# Patient Record
Sex: Male | Born: 1935 | Race: Black or African American | Hispanic: No | Marital: Married | Smoking: Never smoker
Health system: Southern US, Community
[De-identification: ages and names within clinical notes are randomized; demographics above are authoritative.]

## PROBLEM LIST (undated history)

## (undated) DIAGNOSIS — I10 Essential (primary) hypertension: Secondary | ICD-10-CM

## (undated) DIAGNOSIS — I639 Cerebral infarction, unspecified: Secondary | ICD-10-CM

## (undated) DIAGNOSIS — I2510 Atherosclerotic heart disease of native coronary artery without angina pectoris: Secondary | ICD-10-CM

## (undated) DIAGNOSIS — E119 Type 2 diabetes mellitus without complications: Secondary | ICD-10-CM

## (undated) DIAGNOSIS — E7800 Pure hypercholesterolemia, unspecified: Secondary | ICD-10-CM

## (undated) DIAGNOSIS — Z950 Presence of cardiac pacemaker: Secondary | ICD-10-CM

## (undated) DIAGNOSIS — R0602 Shortness of breath: Secondary | ICD-10-CM

## (undated) DIAGNOSIS — IMO0001 Reserved for inherently not codable concepts without codable children: Secondary | ICD-10-CM

## (undated) DIAGNOSIS — I442 Atrioventricular block, complete: Secondary | ICD-10-CM

## (undated) DIAGNOSIS — I739 Peripheral vascular disease, unspecified: Secondary | ICD-10-CM

## (undated) DIAGNOSIS — H409 Unspecified glaucoma: Secondary | ICD-10-CM

## (undated) DIAGNOSIS — I509 Heart failure, unspecified: Secondary | ICD-10-CM

## (undated) DIAGNOSIS — I96 Gangrene, not elsewhere classified: Secondary | ICD-10-CM

## (undated) DIAGNOSIS — R634 Abnormal weight loss: Secondary | ICD-10-CM

## (undated) DIAGNOSIS — I219 Acute myocardial infarction, unspecified: Secondary | ICD-10-CM

## (undated) HISTORY — DX: Heart failure, unspecified: I50.9

## (undated) HISTORY — PX: FOOT SURGERY: SHX648

## (undated) HISTORY — DX: Peripheral vascular disease, unspecified: I73.9

## (undated) HISTORY — DX: Gangrene, not elsewhere classified: I96

## (undated) HISTORY — PX: PR VEIN BYPASS GRAFT,AORTO-FEM-POP: 35551

## (undated) HISTORY — DX: Essential (primary) hypertension: I10

## (undated) HISTORY — DX: Cerebral infarction, unspecified: I63.9

## (undated) HISTORY — DX: Atherosclerotic heart disease of native coronary artery without angina pectoris: I25.10

## (undated) HISTORY — DX: Abnormal weight loss: R63.4

## (undated) HISTORY — PX: PACEMAKER INSERTION: SHX728

## (undated) HISTORY — DX: Acute myocardial infarction, unspecified: I21.9

## (undated) HISTORY — PX: CARDIAC CATHETERIZATION: SHX172

## (undated) HISTORY — PX: INSERT / REPLACE / REMOVE PACEMAKER: SUR710

## (undated) HISTORY — DX: Atrioventricular block, complete: I44.2

## (undated) HISTORY — PX: APPENDECTOMY: SHX54

## (undated) HISTORY — DX: Reserved for inherently not codable concepts without codable children: IMO0001

## (undated) HISTORY — DX: Type 2 diabetes mellitus without complications: E11.9

## (undated) HISTORY — DX: Pure hypercholesterolemia, unspecified: E78.00

## (undated) HISTORY — PX: AMPUTATION: SHX166

---
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
## 1999-05-10 ENCOUNTER — Encounter (INDEPENDENT_AMBULATORY_CARE_PROVIDER_SITE_OTHER): Payer: Self-pay

## 1999-05-10 ENCOUNTER — Other Ambulatory Visit: Admission: RE | Admit: 1999-05-10 | Discharge: 1999-05-10 | Payer: Self-pay | Admitting: Gastroenterology

## 2000-01-31 ENCOUNTER — Encounter: Payer: Self-pay | Admitting: Emergency Medicine

## 2000-01-31 ENCOUNTER — Encounter (INDEPENDENT_AMBULATORY_CARE_PROVIDER_SITE_OTHER): Payer: Self-pay | Admitting: Specialist

## 2000-01-31 ENCOUNTER — Encounter (HOSPITAL_BASED_OUTPATIENT_CLINIC_OR_DEPARTMENT_OTHER): Payer: Self-pay | Admitting: General Surgery

## 2000-01-31 ENCOUNTER — Inpatient Hospital Stay (HOSPITAL_COMMUNITY): Admission: EM | Admit: 2000-01-31 | Discharge: 2000-02-11 | Payer: Self-pay | Admitting: Emergency Medicine

## 2000-02-01 ENCOUNTER — Encounter (HOSPITAL_BASED_OUTPATIENT_CLINIC_OR_DEPARTMENT_OTHER): Payer: Self-pay | Admitting: General Surgery

## 2000-02-03 ENCOUNTER — Encounter (HOSPITAL_BASED_OUTPATIENT_CLINIC_OR_DEPARTMENT_OTHER): Payer: Self-pay | Admitting: General Surgery

## 2000-02-05 ENCOUNTER — Encounter (HOSPITAL_BASED_OUTPATIENT_CLINIC_OR_DEPARTMENT_OTHER): Payer: Self-pay | Admitting: General Surgery

## 2000-02-08 ENCOUNTER — Encounter: Payer: Self-pay | Admitting: Cardiology

## 2000-02-09 ENCOUNTER — Encounter (HOSPITAL_BASED_OUTPATIENT_CLINIC_OR_DEPARTMENT_OTHER): Payer: Self-pay | Admitting: General Surgery

## 2000-02-20 ENCOUNTER — Encounter: Admission: RE | Admit: 2000-02-20 | Discharge: 2000-05-20 | Payer: Self-pay | Admitting: General Surgery

## 2000-03-02 ENCOUNTER — Ambulatory Visit (HOSPITAL_COMMUNITY): Admission: RE | Admit: 2000-03-02 | Discharge: 2000-03-02 | Payer: Self-pay | Admitting: Cardiology

## 2000-03-02 ENCOUNTER — Encounter: Payer: Self-pay | Admitting: Cardiology

## 2000-03-13 ENCOUNTER — Ambulatory Visit (HOSPITAL_COMMUNITY): Admission: RE | Admit: 2000-03-13 | Discharge: 2000-03-14 | Payer: Self-pay | Admitting: Cardiology

## 2000-09-12 ENCOUNTER — Encounter: Payer: Self-pay | Admitting: Cardiology

## 2000-09-12 ENCOUNTER — Ambulatory Visit (HOSPITAL_COMMUNITY): Admission: RE | Admit: 2000-09-12 | Discharge: 2000-09-12 | Payer: Self-pay | Admitting: Cardiology

## 2001-05-15 ENCOUNTER — Encounter: Admission: RE | Admit: 2001-05-15 | Discharge: 2001-05-15 | Payer: Self-pay | Admitting: General Surgery

## 2001-05-15 ENCOUNTER — Encounter: Payer: Self-pay | Admitting: General Surgery

## 2001-05-22 ENCOUNTER — Encounter (HOSPITAL_BASED_OUTPATIENT_CLINIC_OR_DEPARTMENT_OTHER): Admission: RE | Admit: 2001-05-22 | Discharge: 2001-08-20 | Payer: Self-pay | Admitting: Internal Medicine

## 2001-05-28 ENCOUNTER — Encounter (HOSPITAL_BASED_OUTPATIENT_CLINIC_OR_DEPARTMENT_OTHER): Payer: Self-pay | Admitting: Internal Medicine

## 2001-05-28 ENCOUNTER — Ambulatory Visit (HOSPITAL_COMMUNITY): Admission: RE | Admit: 2001-05-28 | Discharge: 2001-05-28 | Payer: Self-pay | Admitting: Internal Medicine

## 2001-06-05 ENCOUNTER — Encounter (INDEPENDENT_AMBULATORY_CARE_PROVIDER_SITE_OTHER): Payer: Self-pay | Admitting: Specialist

## 2001-06-05 ENCOUNTER — Encounter (INDEPENDENT_AMBULATORY_CARE_PROVIDER_SITE_OTHER): Payer: Self-pay | Admitting: *Deleted

## 2001-06-05 ENCOUNTER — Encounter: Payer: Self-pay | Admitting: Vascular Surgery

## 2001-06-05 ENCOUNTER — Inpatient Hospital Stay (HOSPITAL_COMMUNITY): Admission: AD | Admit: 2001-06-05 | Discharge: 2001-06-13 | Payer: Self-pay | Admitting: Vascular Surgery

## 2001-06-10 HISTORY — PX: BELOW KNEE LEG AMPUTATION: SUR23

## 2001-06-13 ENCOUNTER — Inpatient Hospital Stay (HOSPITAL_COMMUNITY)
Admission: AD | Admit: 2001-06-13 | Discharge: 2001-06-21 | Payer: Self-pay | Admitting: Physical Medicine & Rehabilitation

## 2001-06-20 ENCOUNTER — Encounter: Payer: Self-pay | Admitting: Physical Medicine & Rehabilitation

## 2001-07-01 ENCOUNTER — Encounter
Admission: RE | Admit: 2001-07-01 | Discharge: 2001-09-29 | Payer: Self-pay | Admitting: Physical Medicine & Rehabilitation

## 2001-09-30 ENCOUNTER — Encounter
Admission: RE | Admit: 2001-09-30 | Discharge: 2001-11-07 | Payer: Self-pay | Admitting: Physical Medicine & Rehabilitation

## 2002-02-12 ENCOUNTER — Encounter: Payer: Self-pay | Admitting: Cardiology

## 2002-02-12 ENCOUNTER — Ambulatory Visit (HOSPITAL_COMMUNITY): Admission: RE | Admit: 2002-02-12 | Discharge: 2002-02-12 | Payer: Self-pay | Admitting: Cardiology

## 2003-04-22 ENCOUNTER — Ambulatory Visit (HOSPITAL_COMMUNITY): Admission: RE | Admit: 2003-04-22 | Discharge: 2003-04-22 | Payer: Self-pay | Admitting: Cardiology

## 2006-03-08 ENCOUNTER — Emergency Department (HOSPITAL_COMMUNITY): Admission: EM | Admit: 2006-03-08 | Discharge: 2006-03-08 | Payer: Self-pay | Admitting: Family Medicine

## 2007-01-15 ENCOUNTER — Inpatient Hospital Stay (HOSPITAL_COMMUNITY): Admission: EM | Admit: 2007-01-15 | Discharge: 2007-01-18 | Payer: Self-pay | Admitting: Emergency Medicine

## 2007-01-16 ENCOUNTER — Encounter (INDEPENDENT_AMBULATORY_CARE_PROVIDER_SITE_OTHER): Payer: Self-pay | Admitting: Cardiology

## 2007-01-16 ENCOUNTER — Ambulatory Visit: Payer: Self-pay | Admitting: Vascular Surgery

## 2007-01-21 ENCOUNTER — Ambulatory Visit (HOSPITAL_COMMUNITY): Admission: RE | Admit: 2007-01-21 | Discharge: 2007-01-21 | Payer: Self-pay | Admitting: Vascular Surgery

## 2007-01-31 ENCOUNTER — Inpatient Hospital Stay (HOSPITAL_COMMUNITY): Admission: RE | Admit: 2007-01-31 | Discharge: 2007-02-05 | Payer: Self-pay | Admitting: Vascular Surgery

## 2007-01-31 ENCOUNTER — Encounter: Payer: Self-pay | Admitting: Vascular Surgery

## 2007-02-01 ENCOUNTER — Encounter: Payer: Self-pay | Admitting: Vascular Surgery

## 2007-02-04 ENCOUNTER — Ambulatory Visit: Payer: Self-pay | Admitting: Surgery

## 2007-02-15 ENCOUNTER — Ambulatory Visit: Payer: Self-pay | Admitting: Vascular Surgery

## 2007-02-19 ENCOUNTER — Ambulatory Visit: Payer: Self-pay | Admitting: Vascular Surgery

## 2007-03-05 ENCOUNTER — Ambulatory Visit: Payer: Self-pay | Admitting: Vascular Surgery

## 2007-05-07 ENCOUNTER — Ambulatory Visit: Payer: Self-pay | Admitting: Vascular Surgery

## 2007-08-06 ENCOUNTER — Ambulatory Visit: Payer: Self-pay | Admitting: Vascular Surgery

## 2007-08-20 ENCOUNTER — Inpatient Hospital Stay (HOSPITAL_COMMUNITY): Admission: EM | Admit: 2007-08-20 | Discharge: 2007-08-25 | Payer: Self-pay | Admitting: Emergency Medicine

## 2007-08-22 ENCOUNTER — Encounter (INDEPENDENT_AMBULATORY_CARE_PROVIDER_SITE_OTHER): Payer: Self-pay | Admitting: Cardiology

## 2007-08-29 ENCOUNTER — Encounter: Payer: Self-pay | Admitting: Interventional Radiology

## 2007-10-16 ENCOUNTER — Inpatient Hospital Stay (HOSPITAL_COMMUNITY): Admission: RE | Admit: 2007-10-16 | Discharge: 2007-10-17 | Payer: Self-pay | Admitting: Interventional Radiology

## 2007-10-30 ENCOUNTER — Encounter: Payer: Self-pay | Admitting: Interventional Radiology

## 2007-11-05 ENCOUNTER — Ambulatory Visit: Payer: Self-pay | Admitting: Vascular Surgery

## 2008-01-21 ENCOUNTER — Ambulatory Visit: Payer: Self-pay | Admitting: Vascular Surgery

## 2008-07-21 ENCOUNTER — Ambulatory Visit: Payer: Self-pay | Admitting: Vascular Surgery

## 2009-02-11 ENCOUNTER — Ambulatory Visit: Payer: Self-pay | Admitting: Vascular Surgery

## 2009-09-17 ENCOUNTER — Ambulatory Visit: Payer: Self-pay | Admitting: Internal Medicine

## 2009-09-17 ENCOUNTER — Inpatient Hospital Stay (HOSPITAL_COMMUNITY): Admission: EM | Admit: 2009-09-17 | Discharge: 2009-09-23 | Payer: Self-pay | Admitting: Emergency Medicine

## 2009-09-24 ENCOUNTER — Encounter: Payer: Self-pay | Admitting: Internal Medicine

## 2009-09-30 ENCOUNTER — Encounter: Payer: Self-pay | Admitting: Internal Medicine

## 2009-09-30 ENCOUNTER — Ambulatory Visit: Payer: Self-pay

## 2009-10-13 ENCOUNTER — Ambulatory Visit: Payer: Self-pay | Admitting: Vascular Surgery

## 2009-12-22 ENCOUNTER — Ambulatory Visit: Payer: Self-pay | Admitting: Internal Medicine

## 2009-12-22 DIAGNOSIS — E7800 Pure hypercholesterolemia, unspecified: Secondary | ICD-10-CM

## 2009-12-22 DIAGNOSIS — I442 Atrioventricular block, complete: Secondary | ICD-10-CM

## 2009-12-22 DIAGNOSIS — E119 Type 2 diabetes mellitus without complications: Secondary | ICD-10-CM | POA: Insufficient documentation

## 2009-12-22 DIAGNOSIS — I10 Essential (primary) hypertension: Secondary | ICD-10-CM | POA: Insufficient documentation

## 2010-02-15 NOTE — Cardiovascular Report (Signed)
Summary: Office Visit   Office Visit   Imported By: Roderic Ovens 10/13/2009 15:44:24  _____________________________________________________________________  External Attachment:    Type:   Image     Comment:   External Document

## 2010-02-15 NOTE — Assessment & Plan Note (Signed)
Summary: pacer check.mdt.amber   Visit Type:  Pacemaker check Primary Provider:  Rinaldo Cloud  CC:   .  History of Present Illness: The patient presents today for routine electrophysiology followup. He reports doing very well since his pacemaker was implanted. The patient denies symptoms of palpitations, chest pain, shortness of breath, fevers, chills, orthopnea, PND, lower extremity edema, presyncope, syncope, or neurologic sequela.  He has occasional dizziness.  The patient is tolerating medications without difficulties and is otherwise without complaint today.   Current Medications (verified): 1)  Aspirin Ec 325 Mg Tbec (Aspirin) .... Take One Tablet By Mouth Daily 2)  Carvedilol 25 Mg Tabs (Carvedilol) .Marland Kitchen.. 1 Tab Two Times A Day 3)  Benazepril Hcl 20 Mg Tabs (Benazepril Hcl) .Marland Kitchen.. 1 Tab Once Daily 4)  Imdur 60 Mg Xr24h-Tab (Isosorbide Mononitrate) .Marland Kitchen.. 1 Tab Once Daily 5)  Amaryl 4 Mg Tabs (Glimepiride) .Marland Kitchen.. 1 Tab Two Times A Day 6)  Simvastatin 40 Mg Tabs (Simvastatin) .Marland Kitchen.. 1 Tab Once Daily 7)  Protonix 40 Mg Tbec (Pantoprazole Sodium) .Marland Kitchen.. 1 Tab Once Daily  Allergies (verified): No Known Drug Allergies  Past History:  Past Medical History: Complete heart block s/p PPM by JA (MDT) 9/11 HYPERCHOLESTEROLEMIA (ICD-272.0) HYPERTENSION (ICD-401.9) CAD s/p PTCA to PLV branch in 2002 Non-insulin-dependent diabetes mellitus peripheral vascular disease status post right BKA   history of CVA and had PTA to middle cerebral artery in the past  Social History: Reviewed history from 12/22/2009 and no changes required. Married  Tobacco Use - No.  Alcohol Use - no  Review of Systems       All systems are reviewed and negative except as listed in the HPI.   Vital Signs:  Patient profile:   77 year old male Height:      68.5 inches Weight:      198.50 pounds BMI:     29.85 Pulse rate:   77 / minute Pulse rhythm:   regular BP sitting:   168 / 90  (right arm) Cuff size:    large  Vitals Entered By: Danielle Rankin, CMA (December 22, 2009 9:42 AM)  Physical Exam  General:  elderly male, NAD Head:  normocephalic and atraumatic Eyes:  PERRLA/EOM intact; conjunctiva and lids normal. Mouth:  Teeth, gums and palate normal. Oral mucosa normal. Neck:  supple Chest Wall:  pacemaker pocket is well healed Lungs:  Clear bilaterally to auscultation and percussion. Heart:  RRR, no m/r/g Abdomen:  Bowel sounds positive; abdomen soft and non-tender without masses, organomegaly, or hernias noted. No hepatosplenomegaly. Msk:  s/p R BKA Extremities:  s/p R BKA Neurologic:  Alert and oriented x 3. Skin:  Intact without lesions or rashes. Psych:  Normal affect.   PPM Specifications Following MD:  Hillis Range, MD     PPM Vendor:  Medtronic     PPM Model Number:  ADDRL1     PPM Serial Number:  [id number] PPM DOI:  09/21/2009     PPM Implanting MD:  Hillis Range, MD  Lead 1    Location: RA     DOI: 09/21/2009     Model #: [id number]     Serial #: [id number]     Status: active Lead 2    Location: RV     DOI: 09/21/2009     Model #: [id number]     Serial #: [id number]     Status: active  Magnet Response Rate:  BOL 85 ERI 65  Indications:  CHB   PPM  Follow Up Remote Check?  No Battery Voltage:  2.79 V     Battery Est. Longevity:  11.5 years     Pacer Dependent:  Yes       PPM Device Measurements Atrium  Amplitude: 2.8 mV, Impedance: 500 ohms, Threshold: 0.5 V at 0.4 msec Right Ventricle  Impedance: 567 ohms, Threshold: 0.5 V at 0.4 msec  Episodes MS Episodes:  22     Percent Mode Switch:  ,0.1%     Coumadin:  No Ventricular High Rate:  0     Atrial Pacing:  9.9%     Ventricular Pacing:  99.9%  Parameters Mode:  DDD     Lower Rate Limit:  60     Upper Rate Limit:  130 Paced AV Delay:  18     Sensed AV Delay:  0150 Next Cardiology Appt Due:  09/17/2010 Tech Comments:  No parameter changes.  Device function normal.  ROV 9/12 with Dr. Johney Frame. Altha Harm, LPN  December 22, 2009  9:49 AM  MD Comments:  agree  Impression & Recommendations:  Problem # 1:  PACEMAKER, PERMANENT (ICD-V45.01) s/p PPM for complete heart block.  He is no underlying ventricular escape today Normal pacemaker function no changes today  Problem # 2:  HYPERTENSION (ICD-401.9) above goal he will follow-up with Dr Sharyn Lull  Problem # 3:  HYPERCHOLESTEROLEMIA (ICD-272.0) stable  Patient Instructions: 1)  Your physician recommends that you continue on your current medications as directed. Please refer to the Current Medication list given to you today. 2)  Your physician wants you to follow-up in: September 2012   You will receive a reminder letter in the mail two months in advance. If you don't receive a letter, please call our office to schedule the follow-up appointment.

## 2010-02-15 NOTE — Procedures (Signed)
Summary: pacer check.mdt.amber   Allergies (verified): No Known Drug Allergies  PPM Specifications Following MD:  Hillis Range, MD     PPM Vendor:  Medtronic     PPM Model Number:  ADDRL1     PPM Serial Number:  [id number] PPM DOI:  09/21/2009     PPM Implanting MD:  Hillis Range, MD  Lead 1    Location: RA     DOI: 09/21/2009     Model #: 4098     Serial #: [id number]     Status: active Lead 2    Location: RV     DOI: 09/21/2009     Model #: 6213     Serial #: [id number]     Status: active  Magnet Response Rate:  BOL 85 ERI 65  Indications:  CHB   PPM Follow Up Remote Check?  No Battery Voltage:  2.79 V     Battery Est. Longevity:  10 years     Pacer Dependent:  Yes       PPM Device Measurements Atrium  Amplitude: 2.8 mV, Impedance: 494 ohms, Threshold: 0.5 V at 0.4 msec Right Ventricle  Impedance: 645 ohms, Threshold: 0.625 V at 0.4 msec  Episodes MS Episodes:  1     Percent Mode Switch:  <0.1%     Coumadin:  No Ventricular High Rate:  0     Atrial Pacing:  9.1%     Ventricular Pacing:  99.9%  Parameters Mode:  DDD     Lower Rate Limit:  60     Upper Rate Limit:  130 Paced AV Delay:  18     Sensed AV Delay:  0150 Next Cardiology Appt Due:  12/22/2009 Tech Comments:  Steri strips removed, no redness or edema noted.  No parameter changes.  Device function normal.  ROV 12/7 with Dr. Johney Frame.  Mr. Lorey did not have a med list with him today. Altha Harm, LPN  September 30, 2009 10:33 AM

## 2010-02-15 NOTE — Miscellaneous (Signed)
Summary: Device preload  Clinical Lists Changes  Observations: Added new observation of PPM INDICATN: CHB (09/24/2009 7:50) Added new observation of MAGNET RTE: BOL 85 ERI 65 (09/24/2009 7:50) Added new observation of PPMLEADSTAT2: active (09/24/2009 7:50) Added new observation of PPMLEADSER2: [id number] (09/24/2009 7:50) Added new observation of PPMLEADMOD2: 5076  (09/24/2009 7:50) Added new observation of PPMLEADLOC2: RV  (09/24/2009 7:50) Added new observation of PPMLEADSTAT1: active  (09/24/2009 7:50) Added new observation of PPMLEADSER1: [id number]  (09/24/2009 7:50) Added new observation of PPMLEADMOD1: 5076  (09/24/2009 7:50) Added new observation of PPMLEADLOC1: RA  (09/24/2009 7:50) Added new observation of PPM IMP MD: Hillis Range, MD  (09/24/2009 7:50) Added new observation of PPMLEADDOI2: 09/21/2009  (09/24/2009 7:50) Added new observation of PPMLEADDOI1: 09/21/2009  (09/24/2009 7:50) Added new observation of PPM DOI: 09/21/2009  (09/24/2009 7:50) Added new observation of PPM SERL#: [id number]  (09/24/2009 7:50) Added new observation of PPM MODL#: ADDRL1  (09/24/2009 6:57) Added new observation of PACEMAKERMFG: Medtronic  (09/24/2009 7:50) Added new observation of PACEMAKER MD: Hillis Range, MD  (09/24/2009 7:50)      PPM Specifications Following MD:  Hillis Range, MD     PPM Vendor:  [employer]onic     PPM Model Number:  ADDRL1     PPM Serial Number:  [id number] H PPM DOI:  09/21/2009     PPM Implanting MD:  Hillis Range, MD  Lead 1    Location: RA     DOI: 09/21/2009     Model #: 8413     Serial #: [id number]     Status: active Lead 2    Location: RV     DOI: 09/21/2009     Model #: 3664     Serial #: [id number]     Status: active  Magnet Response Rate:  BOL 85 ERI 65  Indications:  CHB

## 2010-02-17 NOTE — Cardiovascular Report (Signed)
Summary: Office Visit   Office Visit   Imported By: Roderic Ovens 12/30/2009 11:54:14  _____________________________________________________________________  External Attachment:    Type:   Image     Comment:   External Document

## 2010-03-31 LAB — POCT CARDIAC MARKERS
CKMB, poc: 3.7 ng/mL (ref 1.0–8.0)
Myoglobin, poc: 359 ng/mL (ref 12–200)
Troponin i, poc: <0.05 ng/mL (ref 0.00–0.09)

## 2010-03-31 LAB — BASIC METABOLIC PANEL
BUN: 17 mg/dL (ref 6–23)
BUN: 17 mg/dL (ref 6–23)
BUN: 24 mg/dL — ABNORMAL HIGH (ref 6–23)
CO2: 27 mEq/L (ref 19–32)
Calcium: 9.3 mg/dL (ref 8.4–10.5)
Calcium: 9.6 mg/dL (ref 8.4–10.5)
Chloride: 101 mEq/L (ref 96–112)
Chloride: 103 mEq/L (ref 96–112)
Chloride: 104 mEq/L (ref 96–112)
Chloride: 109 mEq/L (ref 96–112)
Creatinine, Ser: 0.71 mg/dL (ref 0.4–1.5)
GFR calc Af Amer: >60 mL/min (ref >60)
GFR calc non Af Amer: 35 mL/min — ABNORMAL LOW (ref >60)
GFR calc non Af Amer: 50 mL/min — ABNORMAL LOW (ref >60)
GFR calc non Af Amer: >60 mL/min (ref >60)
Glucose, Bld: 199 mg/dL — ABNORMAL HIGH (ref 70–99)
Potassium: 4 mEq/L (ref 3.5–5.1)
Potassium: 4.5 mEq/L (ref 3.5–5.1)
Sodium: 139 mEq/L (ref 135–145)
Sodium: 141 mEq/L (ref 135–145)

## 2010-03-31 LAB — CK TOTAL AND CKMB (NOT AT ARMC)
CK, MB: 4.1 ng/mL — ABNORMAL HIGH (ref 0.3–4.0)
Relative Index: 3.5 — ABNORMAL HIGH (ref 0.0–2.5)

## 2010-03-31 LAB — CBC
HCT: 36.7 % — ABNORMAL LOW (ref 39.0–52.0)
Hemoglobin: 12.1 g/dL — ABNORMAL LOW (ref 13.0–17.0)
MCH: 27.4 pg (ref 26.0–34.0)
MCH: 28.1 pg (ref 26.0–34.0)
MCHC: 32.3 g/dL (ref 30.0–36.0)
MCHC: 32.5 g/dL (ref 30.0–36.0)
MCHC: 32.6 g/dL (ref 30.0–36.0)
MCHC: 33 g/dL (ref 30.0–36.0)
MCV: 84 fL (ref 78.0–100.0)
Platelets: 155 10*3/uL (ref 150–400)
Platelets: 162 10*3/uL (ref 150–400)
Platelets: 189 10*3/uL (ref 150–400)
Platelets: 237 10*3/uL (ref 150–400)
RBC: 4.24 MIL/uL (ref 4.22–5.81)
RBC: 4.31 MIL/uL (ref 4.22–5.81)
RDW: 15.4 % (ref 11.5–15.5)
RDW: 15.5 % (ref 11.5–15.5)
RDW: 16 % — ABNORMAL HIGH (ref 11.5–15.5)
RDW: 16 % — ABNORMAL HIGH (ref 11.5–15.5)
WBC: 6.6 10*3/uL (ref 4.0–10.5)
WBC: 7.8 10*3/uL (ref 4.0–10.5)
WBC: 9.9 10*3/uL (ref 4.0–10.5)

## 2010-03-31 LAB — CARDIAC PANEL(CRET KIN+CKTOT+MB+TROPI)
CK, MB: 1.5 ng/mL (ref 0.3–4.0)
CK, MB: 6.2 ng/mL (ref 0.3–4.0)
CK, MB: 7.4 ng/mL (ref 0.3–4.0)
CK, MB: 9.4 ng/mL (ref 0.3–4.0)
Relative Index: 4 — ABNORMAL HIGH (ref 0.0–2.5)
Relative Index: 4.4 — ABNORMAL HIGH (ref 0.0–2.5)
Total CK: 171 U/L (ref 7–232)
Total CK: 214 U/L (ref 7–232)
Total CK: 81 U/L (ref 7–232)
Troponin I: 0.22 ng/mL — ABNORMAL HIGH (ref 0.00–0.06)
Troponin I: 1.61 ng/mL (ref 0.00–0.06)

## 2010-03-31 LAB — GLUCOSE, CAPILLARY
Glucose-Capillary: 106 mg/dL — ABNORMAL HIGH (ref 70–99)
Glucose-Capillary: 109 mg/dL — ABNORMAL HIGH (ref 70–99)
Glucose-Capillary: 133 mg/dL — ABNORMAL HIGH (ref 70–99)
Glucose-Capillary: 137 mg/dL — ABNORMAL HIGH (ref 70–99)
Glucose-Capillary: 141 mg/dL — ABNORMAL HIGH (ref 70–99)
Glucose-Capillary: 153 mg/dL — ABNORMAL HIGH (ref 70–99)
Glucose-Capillary: 159 mg/dL — ABNORMAL HIGH (ref 70–99)
Glucose-Capillary: 177 mg/dL — ABNORMAL HIGH (ref 70–99)
Glucose-Capillary: 208 mg/dL — ABNORMAL HIGH (ref 70–99)
Glucose-Capillary: 80 mg/dL (ref 70–99)
Glucose-Capillary: 85 mg/dL (ref 70–99)
Glucose-Capillary: 90 mg/dL (ref 70–99)
Glucose-Capillary: 95 mg/dL (ref 70–99)

## 2010-03-31 LAB — LACTIC ACID, PLASMA: Lactic Acid, Venous: 3.6 mmol/L — ABNORMAL HIGH (ref 0.5–2.2)

## 2010-03-31 LAB — LIPID PANEL
LDL Cholesterol: 83 mg/dL (ref 0–99)
Triglycerides: 65 mg/dL (ref <150)
VLDL: 13 mg/dL (ref 0–40)

## 2010-03-31 LAB — DIFFERENTIAL
Basophils Absolute: 0 10*3/uL (ref 0.0–0.1)
Eosinophils Absolute: 0 10*3/uL (ref 0.0–0.7)
Eosinophils Relative: 0 % (ref 0–5)
Monocytes Absolute: 0.5 10*3/uL (ref 0.1–1.0)
Monocytes Relative: 5 % (ref 3–12)
Neutro Abs: 8.2 10*3/uL — ABNORMAL HIGH (ref 1.7–7.7)

## 2010-03-31 LAB — APTT: aPTT: 31 seconds (ref 24–37)

## 2010-03-31 LAB — MRSA PCR SCREENING: MRSA by PCR: NEGATIVE

## 2010-04-13 ENCOUNTER — Encounter (INDEPENDENT_AMBULATORY_CARE_PROVIDER_SITE_OTHER): Payer: Medicare Other

## 2010-04-13 DIAGNOSIS — Z48812 Encounter for surgical aftercare following surgery on the circulatory system: Secondary | ICD-10-CM

## 2010-04-13 DIAGNOSIS — I739 Peripheral vascular disease, unspecified: Secondary | ICD-10-CM

## 2010-04-21 NOTE — Procedures (Unsigned)
BYPASS GRAFT EVALUATION  INDICATION:  Follow up left fem-pop graft.  HISTORY: Diabetes:  Yes. Cardiac:  No. Hypertension:  Yes. Smoking:  No. Previous Surgery:  Fem-pop graft placed 01/31/2007.  SINGLE LEVEL ARTERIAL EXAM                              RIGHT              LEFT Brachial:                    196                191 Anterior tibial:                                178 Posterior tibial:                               140 Peroneal: Ankle/brachial index:        BKA                0.91 (DP/0.71) PT  PREVIOUS ABI:  Date: 10/13/2009  RIGHT:  NA  LEFT:  0.92  LOWER EXTREMITY BYPASS GRAFT DUPLEX EXAM:  DUPLEX: 1. Patent left fem-pop graft without evidence of significant stenosis.     Proximal and distal anastomoses are difficult to visualize due to     surgical scarring. 2. All waveforms are biphasic within the graft and monophasic at the     ankle.  IMPRESSION: 1. Patent left femoropopliteal graft without evidence of stenosis. 2. Stable ankle brachial indices compared to previous examination;     however, suspect arterial calcification and inaccurate     pressure/ankle brachial index of the anterior tibial artery.  ___________________________________________ Di Kindle. Edilia Bo, M.D.  LT/MEDQ  D:  04/13/2010  T:  04/13/2010  Job:  [id number]

## 2010-05-31 NOTE — Discharge Summary (Signed)
[name], [name]                ACCOUNT NO.:  [id number]   MEDICAL RECORD NO.:  [id number]          PATIENT TYPE:  INP   LOCATION:  3114                         FACILITY:  [facility]   PHYSICIAN:  Sanjeev K. Deveshwar, M.D.DATE OF BIRTH:  1931-12-25   DATE OF ADMISSION:  10/16/2007  DATE OF DISCHARGE:  10/17/2007                               DISCHARGE SUMMARY   CHIEF COMPLAINT:  Cerebrovascular disease.   HISTORY OF PRESENT ILLNESS:  This is a pleasant 77 year old male who was  admitted to [facility] on early August of this year with a  pontine CVA.  The patient presented with right-sided weakness and  dysarthria.  While in the hospital, he was seen in consultation by Dr.  Lesia Sago.  He was also seen by Dr. Corliss Skains and had a cerebral  angiogram performed on August 23, 2007, this showed a 90-95% stenosis of  the inferior division of the left middle cerebral artery at the origin.  The patient was also noted to have another 70% left middle cerebral  artery lesion as well as an approximate 50-60% stenosis of the left  internal carotid artery at the bulb.   Fortunately, the patient's right-sided symptoms improved considerably.  He was discharged from the hospital with residual mild dysarthria.  The  patient was seen in consultation by Dr. Corliss Skains on August 29, 2007 at  that time, treatment options were discussed with the patient and his  family as well as the risks and benefits of treatment.  The patient  decided to proceed with endovascular stent-assisted angioplasty and was  admitted to [facility] on October 16, 2007 for that  procedure.   PAST MEDICAL HISTORY:  Significant for diabetes mellitus,  hyperlipidemia, cerebrovascular disease with a previous CVA,  hypertension, coronary artery disease with previous cardiac stents 3-4  years ago.  The patient denies any history of a previous MI.  He does  have peripheral vascular disease.  He had cellulitis of his  left foot  with gangrene of the second toe.  He had a right BKA performed at some  point.  He had an echo during a recent hospital admission which revealed  an ejection fraction of 45-50% with hypokinesis of the left ventricle  and a question of a PFO.  The patient also has had congestive heart  failure in the past.   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  He denies any previous problems with anesthesia.   ALLERGIES:  No known drug allergies.   Medications at time of admission included Lotensin, Coreg, aspirin,  Plavix, Amaryl, insulin, Imdur, Crestor, omeprazole, and metformin.  The  patient had not been on aspirin when seen in consultation by Dr.  Corliss Skains and Dr. Corliss Skains recommended that he add aspirin to his  medical regimen.   SOCIAL HISTORY:  The patient is married.  He has 7 children.  He lives  in [place].  He has never smoked or used alcohol.  He is a retired  custodian.   FAMILY HISTORY:  His mother died in her 78s from congestive heart  failure.  His father died in his 54s from congestive heart failure.  He  had 7 brothers and sisters, 3 of whom are deceased.  One brother died  from prostate cancer.  He had another sister and a brother who are  deceased, but he does not know the etiology.   HOSPITAL COURSE:  As noted, the patient was admitted to [facility] on October 16, 2007, for treatment of cerebrovascular  disease.  On the day of admission, the patient had a repeat cerebral  angiogram with plans to perform a stent-assisted angioplasty on the left  middle cerebral artery.  Unfortunately, due to the tortuosity of the  vessels, a stent was not able to be placed.  The procedure was also  complicated by a high-grade stenosis of the left internal carotid  artery.  Please see Dr. Fatima Sanger report for full details.  An  angioplasty was  performed with excellent results.   Following the procedure, the patient was admitted to the neurointensive  care unit where he was monitored on IV heparin overnight per Dr.  Fatima Sanger protocol.  The following day, the heparin was discontinued.  The right femoral groin sheath was removed.  The patient is currently on  bed rest.  After his bed rest is up if he remains stable, he will be  discharged to home.  The patient does have a right BKA.  He ambulates  with a prosthesis, although Dr. Corliss Skains did not want him ambulating  until tomorrow at the earliest.   During the patient's stay, he did have some indigestion symptoms while  lying flat.  These were treated with Mylanta and Pepcid, and resolved.  He was also noted to be anemic.  Outpatient followup was recommended.   LABORATORY DATA:  A CBC on October 11, 2007 revealed a hemoglobin of  13.4, hematocrit 41.5, WBCs were 6.1 thousand, and platelets were  242,000.  On October 17, 2007, hemoglobin had dropped to 10.1, hematocrit  31, WBCs 6.6 thousand, and platelets 218,000.  A basic metabolic panel  on October 11, 2007 revealed a BUN of 18, creatinine 1.02, GFR was  greater than 60, glucose was 100, potassium was 4.8.  On October 17, 2007, BUN was 28, creatinine 1.18, GFR greater than 60, glucose 208, and  potassium 4.2.   DISCHARGE MEDICATIONS:  The patient was told to resume his home  medications at time of discharge except for the metformin.  He was to  wait 2 days before resuming his metformin.   The patient was given instructions regarding wound care.  He was told  not to do anything strenuous for at least 2 weeks.  He was not to drive  for 2 weeks.   The patient will return to see Dr. Corliss Skains in approximately 2 weeks  who was recommended that he follow up with Dr. Sharyn Lull in 1-2 weeks for  a repeat CBC to monitor his anemia.  A followup cerebral angiogram will  be scheduled in approximately 3 months to reevaluate the  site of the  angioplasty.   DISCHARGE DIAGNOSES:  1. Cerebrovascular disease with previous cerebrovascular accident with      ongoing transient ischemic attack-like symptoms.  2. Cerebral angiogram performed August 23, 2007 showing diffuse severe      vascular disease.  3. Status post percutaneous transluminal angioplasty of the left      middle cerebral  artery performed on the day of admission with      excellent results and no known or immediate complications.  Stent      placement was not possible at this time.  4. Residual diffuse cerebrovascular disease including a left carotid      lesion.  5. Diabetes mellitus.  6. Hyperlipidemia.  7. Coronary artery disease with previous cardiac stents.  8. Hypertension.  9. Ejection fraction of 45-50% by previous echocardiogram with a      question of a patent foramen ovale.  10.Peripheral vascular disease.  11.History of congestive heart failure.  12.Status post right below-knee amputation as well as several other      surgeries.      Delton See, P.A.    ______________________________  Grandville Silos. Corliss Skains, M.D.    DR/MEDQ  D:  10/17/2007  T:  10/18/2007  Job:  [id number]   cc:   Eduardo Osier. Sharyn Lull, M.D.  Marlan Palau, M.D.

## 2010-05-31 NOTE — Assessment & Plan Note (Signed)
OFFICE VISIT   FERGUSON, GERTNER  DOB:  [birth year]                                       05/07/2007  WJXBJ#:[id number]   I saw the patient in the office today for followup after his recent left  fem to above knee pop bypass with a 6 mm PROPATEN graft and left second  toe amputation.  Comes in for routine followup visit.   EXAMINATION:  The incisions have all healed nicely.  The toe amputation  appears to be healing nicely.  He is having some pain and I have written  a prescription for 30 oxycodone.   His graft scan in the office today shows a widely patent graft with an  ABI of 84% on the left.   Overall, I am pleased with his progress.  I will see him back when he  comes back for protocol scan in July.   Di Kindle. Edilia Bo, M.D.  Electronically Signed   CSD/MEDQ  D:  05/07/2007  T:  05/08/2007  Job:  [id number]

## 2010-05-31 NOTE — Assessment & Plan Note (Signed)
OFFICE VISIT   [name], [name]  DOB:  09/23/1931                                       02/15/2007  ZOXWR#:[id number]   The patient was seen today in our office.  He was here for suture  removal and I was asked to see him as well.  His wounds are all healing  quite nicely.  He does have some 2+ popliteal graft pulse.  His nylon  sutures were intact in his toe.  He is approximately 2 weeks out and  will see Dr. Edilia Bo as scheduled next week for official followup and  also for suture removal from his toe.   Larina Earthly, M.D.  Electronically Signed   TFE/MEDQ  D:  02/15/2007  T:  02/18/2007  Job:  [id number]

## 2010-05-31 NOTE — Procedures (Signed)
BYPASS GRAFT EVALUATION   INDICATION:  Followup of a left femoral-popliteal bypass graft.   HISTORY:  Diabetes:  Yes.  Cardiac:  No.  Hypertension:  Yes.  Smoking:  No.  Previous Surgery:  Left fem-pop 01/31/2007, right below knee amputation  06/09/2001.   SINGLE LEVEL ARTERIAL EXAM                               RIGHT              LEFT  Brachial:                    169                180  Anterior tibial:                                118  Posterior tibial:                               118  Peroneal:  Ankle/brachial index:        BKA                0.66   PREVIOUS ABI:  Date:  07/21/2008  RIGHT:  BKA  LEFT:  0.79   LOWER EXTREMITY BYPASS GRAFT DUPLEX EXAM:   DUPLEX:  Biphasic Doppler waveforms throughout bypass graft and natives  arteries.   IMPRESSION:  1. Patent bypass graft with no evidence of stenosis.  2. Ankle brachial index suggests moderate disease.   ___________________________________________  Di Kindle. Edilia Bo, M.D.   CJ/MEDQ  D:  02/11/2009  T:  02/11/2009  Job:  [id number]

## 2010-05-31 NOTE — Consult Note (Signed)
[name], [name] NO.:  [id number]   MEDICAL RECORD NO.:  [id number]          PATIENT TYPE:  OUT   LOCATION:  XRAY                         FACILITY:  [facility]   PHYSICIAN:  Sanjeev K. Deveshwar, M.D.DATE OF BIRTH:  [birth year]   DATE OF CONSULTATION:  08/29/2007  DATE OF DISCHARGE:                                 CONSULTATION   DATE OF CONSULT:  August 29, 2007.   CHIEF COMPLAINT:  Cerebrovascular disease.   HISTORY OF PRESENT ILLNESS:  This is a pleasant 77 year old male who was  admitted to [facility] in early August of this year with a  pontine CVA.  He presented with right-sided weakness and dysarthria.  While in the hospital, he was seen by Dr. Lesia Sago.  He was also  seen by Dr. Corliss Skains who performed a cerebral angiogram on August 23, 2007.  This revealed a 90-95% stenosis of the inferior division of the  left middle cerebral artery at the origin.  There was also noted to be  another 70% left middle cerebral artery stenosis as well as an  approximate 50-60% stenosis of the left internal carotid artery at the  bulb.  Fortunately, the patient has right-sided symptoms improved.  He  was discharged from the hospital with residual dysarthria.  He presents  today to be seen in consultation by Dr. Corliss Skains.  He is accompanied by  his sister.   PAST MEDICAL HISTORY:  Significant for diabetes mellitus,  hyperlipidemia, cerebrovascular disease with CVA, hypertension, and  coronary artery disease with previous cardiac stents approximately 3-4  years ago.  The patient denies any history of having had an MI.  He has  peripheral vascular disease.  He had cellulitis of his left foot with  gangrene of the second toe.  He had an echo during his last admission  that revealed an ejection fraction of 45-50% with hypokinesis of the  left ventricle and a question of a PFO.  He has also had some congestive  heart failure in the past.   PAST SURGICAL HISTORY:   Significant for a right BKA, cataract surgery,  appendectomy, and a fatty tumor removed from his left chest wall.   ALLERGIES:  No known drug allergies.   CURRENT MEDICATIONS:  Lotensin, Coreg, Plavix, Amaryl, insulin, Imdur,  Crestor, omeprazole, and metformin.  The patient states he is currently  not taking aspirin.  He does not believe he was instructed to take  aspirin.   SOCIAL HISTORY:  The patient is married.  He has 7 children.  He lives  in [place].  He has never smoked or used alcohol.  He is a retired  Arboriculturist.   FAMILY HISTORY:  His mother died in her 43s of congestive heart failure.  His father died in his 75s of congestive heart failure.  He had 7  brothers and sisters, 3 of whom are deceased.  One brother died from  prostate cancer.  He has another sister and brother who are deceased,  but he does not know the etiology.   IMPRESSION AND PLAN:  As noted, the  patient presents today accompanied  by his sister to discuss his cerebrovascular disease and further  treatment options.  Dr. Corliss Skains described the stenosis in the left  middle cerebral artery.  He also went over the images from the angiogram  with the patient and his sister.  He felt that the patient could  continue antiplatelet therapy, although he felt that his risk of a  second stroke would be in the neighborhood 25-27% over the next 16  months.  Stent-assisted angioplasty of the left middle cerebral artery  was also discussed.  Dr. Corliss Skains described the procedure in detail  along with the potential risks and benefits.  All of their questions  were answered.  Dr. Corliss Skains instructed them to go home and give the  matter some more thought before making a decision.  There were also  given some written materials on stroke education.   As noted, the patient is on Plavix, however, he is not on aspirin.  Dr.  Corliss Skains did not want change his medications at this time, although he  felt that if patient  wanted to proceed with intervention, he should  start aspirin approximately 3 days prior to the stenting procedure in  addition to the Plavix which he is currently taking.   Greater than 40 minutes was spent on this consult.      Delton See, P.A.    ______________________________  Grandville Silos. Corliss Skains, M.D.    DR/MEDQ  D:  08/29/2007  T:  08/30/2007  Job:  [id number]   cc:   Eduardo Osier. Sharyn Lull, M.D.  Marlan Palau, M.D.

## 2010-05-31 NOTE — Op Note (Signed)
[name], [name]                ACCOUNT NO.:  [id number]   MEDICAL RECORD NO.:  [id number]          PATIENT TYPE:  AMB   LOCATION:  SDS                          FACILITY:  [facility]   PHYSICIAN:  Di Kindle. Edilia Bo, M.D.DATE OF BIRTH:  1931/08/01   DATE OF PROCEDURE:  01/21/2007  DATE OF DISCHARGE:                               OPERATIVE REPORT   PREOPERATIVE DIAGNOSIS:  Nonhealing left second toe wound.   POSTOPERATIVE DIAGNOSIS:  Nonhealing left second toe wound.   PROCEDURE:  1. Aortogram.  2. Bilateral iliac arteriogram.  3. Left lower extremity runoff.   INDICATIONS:  This is a pleasant 77 year old gentleman who has undergone  a previous right below-the-knee amputation.  He presented with dry  gangrene of the tip of the left second toe.  He is brought in for  diagnostic arteriography to see what options he has for  revascularization.   TECHNIQUE:  The patient was taken to the PV lab at [facility] and sedated with  1 mg of Versed and 50 mcg of fentanyl.  The right groin was prepped and  draped in the usual sterile fashion.  After the skin was infiltrated  with 1% lidocaine, the right common femoral artery was cannulated and a  guidewire introduced into the infrarenal aorta under fluoroscopic  control.  A 5-French sheath was introduced over the wire.  The dilator  was removed.  A pigtail catheter was positioned at the L1 vertebral body  and flush aortogram obtained.  The catheter was then repositioned above  the aortic bifurcation and an oblique iliac projection was obtained.  Next, the pigtail catheter was exchanged for an IMA catheter which was  positioned into the left common iliac artery.  The wire was then  advanced into the external iliac artery and then the IMA catheter  exchanged for an end-hole catheter.  Left lower extremity runoff film  was obtained through the end-hole catheter.   FINDINGS:  There are single renal arteries bilaterally with no  significant renal  artery stenosis identified.  The infrarenal aorta is  widely patent.  The common iliacs, external iliacs and hypogastric  arteries are widely patent bilaterally.   On the left side, the common femoral and deep femoral arteries are  patent.  The superficial femoral artery is occluded at its origin.  There is reconstitution of the above-knee popliteal artery.  The  anterior tibial and posterior  tibial arteries are occluded.  The proximal peroneal artery has severe  diffuse disease but is patent distally and reconstitutes a short segment  dorsalis pedis and a small diseased posterior tibial artery at the  ankle.   CONCLUSION:  Superficial femoral artery occlusion with severe tibial  occlusive disease as described above.      Di Kindle. Edilia Bo, M.D.  Electronically Signed     CSD/MEDQ  D:  01/21/2007  T:  01/21/2007  Job:  [id number]   cc:   Eduardo Osier. Sharyn Lull, M.D.

## 2010-05-31 NOTE — H&P (Signed)
[name], [name]                ACCOUNT NO.:  [id number]   MEDICAL RECORD NO.:  [id number]          PATIENT TYPE:  AMB   LOCATION:  SDS                          FACILITY:  [facility]   PHYSICIAN:  Sanjeev K. Deveshwar, M.D.DATE OF BIRTH:  06/13/1931   DATE OF ADMISSION:  10/16/2007  DATE OF DISCHARGE:                              HISTORY & PHYSICAL   CHIEF COMPLAINT:  Cerebrovascular disease.   HISTORY OF PRESENT ILLNESS:  This is a pleasant 77 year old male who was  admitted to [facility] in early August of this year with a  pontine CVA.  He presented with right-sided weakness and dysarthria.  While in the hospital he was seen by Dr. Lesia Sago.  He was also seen  by Dr. Corliss Skains who performed a cerebral angiogram on August 23, 2007.  This showed a 90-95% stenosis of the inferior division of the left  middle cerebral artery at the origin.  There was also noted to be  another 70% left middle cerebral artery stenosis as well as an  approximate 50-60% stenosis of the left internal carotid artery at the  bulb.  Fortunately the patient's right-sided symptoms improved.  He was  discharged from the hospital with residual mild dysarthria.  He was seen  in consultation by Dr. Corliss Skains on August 29, 2007.  Treatment options  were discussed and a decision was made to proceed with endovascular  stent assisted angioplasty.  The patient is admitted to the hospital  today for that procedure.   PAST MEDICAL HISTORY:  Significant for diabetes mellitus,  hyperlipidemia, cerebrovascular disease with previous CVA as noted,  hypertension, coronary artery disease, previous cardiac stents 3-4 years  ago.  The patient denies any history of a previous MI.  He has  peripheral vascular disease.  He had cellulitis of his left foot with  gangrene of the second toe.  He had an echo during his most recent  admission that revealed an ejection fraction of 45-50% with hypokinesis  of the left ventricle  and a question of a PFO.  He also has had some  congestive heart failure in the past.   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  He denies any previous problems with anesthesia.   ALLERGIES:  No known drug allergies.   MEDICATIONS:  Include Lotensin, Coreg, aspirin, Plavix, Amaryl, insulin,  Imdur, Crestor, omeprazole, metformin.  The patient had not been on  aspirin when seen by Dr. Corliss Skains.  Dr. Corliss Skains encouraged him to add  aspirin to his regimen.   SOCIAL HISTORY:  The patient is married.  He has seven children.  He  lives in [place].  He has never smoked or used alcohol.  He is a  retired Arboriculturist.   FAMILY HISTORY:  His mother died in her 83s of congestive heart failure.  His father died in his 12s of congestive heart failure.  He had seven  brothers and sisters, three of whom are deceased.  One brother died from  prostate cancer.  He had another sister and brother who are deceased but  he does not know the etiology.   REVIEW OF SYSTEMS:  Is completely negative except for diabetes.  He has  had ongoing intermittent right-sided weakness and speech difficulties.  These episodes are brief, his last one was approximately 1 week ago.   LABORATORY DATA:  An INR 0.9, PTT is 26, BUN 18, creatinine 1.02, GFR  greater than 60, glucose 100, hemoglobin 13.4, hematocrit 41.5, WBC  6100, platelets 242,000.   PHYSICAL EXAMINATION:  GENERAL:  Physical exam reveals a very pleasant  77 year old African American male in no acute distress.  HEENT:  Unremarkable.  NECK:  Revealed no bruits.  HEART:  Revealed regular rate and rhythm with distant heart sounds.  LUNGS:  Lungs were clear but slightly decreased.  EXTREMITIES:  Revealed a right BKA which is well-healed.  The pulses in  the left lower extremity are weak to absent.  He has trace edema of the  left lower  extremity.  His airway is rated at a 3.  His ASA scale is a  4.  NEUROLOGICAL:  The patient is alert and oriented and follows commands.  Cranial nerves II-XII are grossly intact.  Sensation is intact to light  touch.  Motor strength is 5/5 throughout.  Cerebellar testing - finger-  to-nose testing was performed with mild tremor bilaterally   IMPRESSION:  1. Cerebrovascular disease with previous CVA (cerebrovascular      accident) with ongoing TIA (transient ischemic attack) like      symptoms.  2. Previous cerebral angiogram performed August 23, 2007, showing      diffuse severe vascular disease.  3. Diabetes mellitus.  4. Hyperlipidemia.  5. Coronary artery disease with previous stents.  6. Hypertension.  7. Ejection fraction 45-50% by echo with a question of a PFO (patent      foramen ovale).  8. Peripheral vascular disease.  9. History of congestive heart failure.  10.Status post right BKA as well as several other surgeries.   PLAN:  As noted, the patient is admitted to [facility] today  for possible stent assisted angioplasty of the left middle cerebral  artery to be performed under general anesthesia by Dr. Corliss Skains if felt  to be safe and indicated.      Delton See, P.A.    ______________________________  Grandville Silos. Corliss Skains, M.D.    DR/MEDQ  D:  10/16/2007  T:  10/16/2007  Job:  [id number]   cc:   Eduardo Osier. Sharyn Lull, M.D.  Marlan Palau, M.D.

## 2010-05-31 NOTE — Discharge Summary (Signed)
NAMEDAXTON, NYDAM NO.:  [id number]   MEDICAL RECORD NO.:  [id number]          PATIENT TYPE:  INP   LOCATION:  2034                         FACILITY:  [facility]   PHYSICIAN:  Di Kindle. Edilia Bo, M.D.DATE OF BIRTH:  04/16/1931   DATE OF ADMISSION:  01/31/2007  DATE OF DISCHARGE:  02/05/2007                               DISCHARGE SUMMARY   ADMISSION DIAGNOSIS:  Peripheral vascular occlusive disease with dry  gangrene of his left second toe.   DISCHARGE/SECONDARY DIAGNOSES:  1. Peripheral vascular occlusive disease with dry gangrene of the left      second toe status post left femoral-popliteal bypass and left      second toe amputation.  2. Hypertension.  3. Hypercholesterolemia.  4. Coronary artery disease.  5. History of congestive heart failure.  6. History of appendectomy.  7. History of right below-knee amputation.  8. History of hyperlipidemia.  9. Diabetes mellitus type 2 with well controlled and even low      (uneventful hypoglycemic events this hospitalization but with      elevated hemoglobin A1c of 12.0 on admission).  10.History of cataracts.  11.History of tobacco use but quit 30 years ago.  12.History of successful PTCA to distal RCA in 2002.   ALLERGIES:  NO KNOWN DRUG ALLERGIES.   PROCEDURES:  1. January 31, 2007 harvesting of left greater saphenous vein, left      femoral to above-knee popliteal artery bypass using 6-mm PTFE      Propaten graft.  2. Intraoperative arteriogram.  3. Left second toe amputation by Dr. Waverly Ferrari.  4. Postoperative ankle-brachial indices on the left was 0.83 on      February 04, 2007.   CONSULTATIONS:  Physical therapy.   BRIEF HISTORY:  Mr. Hellstrom is a 77 year old male status post previous  right below-knee amputation on May 23, 2001.  He was admitted to [facility] on January 15, 2007 with wound on the left second toe.  He had dry gangrene with some cellulitis which responded to  IV  antibiotics.  He underwent arteriogram which showed a superficial  femoral artery occlusion with reconstitution in the above-knee popliteal  artery with severe tibial occlusive disease and no vessels distally for  bypass.  It was felt that his best chance for limb salvage was a left  femoral-popliteal bypass grafting to improve collateral flow into the  foot.  He has been followed by Dr. Waverly Ferrari and scheduled for  surgery on January 31, 2007.  Preoperatively he had no fevers or chills,  but again had left second toe infection and had been treated on  ciprofloxacin.  He did not have significant drainage however.  He did  have some neuropathy and did not give a history of left leg claudication  or rest pain.   HOSPITAL COURSE:  Mr. Mamula was admitted to [facility] on  January 31, 2007 to undergo a left femoral-popliteal artery bypass as  well as left second toe amputation for dry gangrene.  Postoperatively he  was kept on IV Zosyn and later transitioned to  oral Augmentin. Physical  therapy was consulted for mobility since the patient had a prior  amputation and now a new toe amputation on his contralateral leg.  He  was evaluated on February 04, 2007 and felt that he would need either  nursing home placement unless he was able to have 24-hour  supervision/assistance at home with home health physical and continued  visits by home health physical therapy.  Of note, he was to ambulate  with partial weightbearing to his heel only on the left leg.  At this  point it is believed that his family is able to provide 24 hour  assistance via his son-in-law and daughter.  If we learn that this is  not the case we will get a clinical social worker involved for nursing  home placement for at least short-term therapy.  Postoperatively, Mr.  Cusic's ABI on the left was 0.83.  His incisions were clean and dry  without signs of infection.  He had a left posterior tibial Doppler   signal.  The toe amputation site appeared to be healing well.  He was  tolerating an appropriate diet.  His pain was controlled on oral  medications.  His vitals remained stable showing last blood pressure of  142/76, heart rate in the 60s, oxygen saturation 93%-97%, primarily  afebrile with only intermittent temperature spikes in the low 99s.  His  blood sugars were well-controlled in fact he was changed to a sensitive  scale due to some mild, intermittent hypoglycemia with blood sugar in  the 60s.  He has been able to void following Foley catheter removal.  If  Mr. Macbride continues to make progress with his mobility, and again we  are able to confirm that he has 24 hour supervision it is anticipated  that he will be ready for discharge home on February 05, 2007.  Currently  he remains in stable condition.  His most recent labs show a hemoglobin  A1c of 12.0.  Sodium 137, potassium 3.8, chloride 100, CO2 26, blood  glucose 250, BUN of 9, creatinine 0.95.  Calcium of 8.8.  White blood  count of 6.6, hemoglobin 10.4, hematocrit of 31.5, platelet count 232.  Preoperative liver function tests showed a total bilirubin 0.5, alkaline  phosphatase elevated at 144, AST 37, ALT elevated at 73, total protein  6.9, blood albumin 3.3, calcium 9.9.  His preoperative urinalysis was  negative for leukocytes and nitrites.   DISCHARGE MEDICATIONS:  1. Aspirin 81 mg p.o. daily.  2. Lipitor 80 mg p.o. daily.  3. Metformin 1000 mg daily.  4. Amaryl 4 mg daily.  5. Atenolol 25 mg daily.  6. Benazepril 20 mg daily.  7. Omeprazole 20 mg daily.  8. Nitroglycerin 0.4 mg transdermal patch daily.  9. Ciprofloxacin 500 mg one tablet p.o. b.i.d.  He should continue      this until he finishes his prescribed course from home.  10.Coreg 12.5 mg p.o. b.i.d.  11.Tylox 1-2 tablets p.o. q.4-6 h. p.r.n. pain.   DISCHARGE INSTRUCTIONS:  1. He should continue a diabetic appropriate diet with continued      follow up  with his family physician.  2. Increase activity slowly, walk with assistance and avoid driving as      applicable until cleared by Dr. Edilia Bo.  3. His incision should be cleaned gently with soap and water.  Dry      gauze dressing should be placed over his groin incision and toe  amputation site daily and as needed.  4. Dr. Edilia Bo should be notified if he develops fever greater than      101 or redness or purulent drainage from his incision sites.  5. He is to have partial weightbearing to his left foot with pressure      on his heel only.  6. He should ambulate with a Darko shoe to the left foot and      prosthesis to his right leg.  7. He should see Dr. Waverly Ferrari in approximately three weeks      with ankle brachial indices.  Our office      will contact him regarding a specific appointment date and time.  8. He should have staples removed in approximately 10 days.  Our      office will contact him regarding a specific appointment date and      time.      Jerold Coombe, P.A.      Di Kindle. Edilia Bo, M.D.  Electronically Signed    AWZ/MEDQ  D:  02/04/2007  T:  02/05/2007  Job:  [id number]   cc:   Eduardo Osier. Sharyn Lull, M.D.

## 2010-05-31 NOTE — Consult Note (Signed)
NAMEBREKEN, [name] NO.:  [id number]   MEDICAL RECORD NO.:  [id number]          PATIENT TYPE:  INP   LOCATION:  6729                         FACILITY:  [facility]   PHYSICIAN:  Marlan Palau, M.D.  DATE OF BIRTH:  01/02/1932   DATE OF CONSULTATION:  08/22/2007  DATE OF DISCHARGE:                                 CONSULTATION   REASON FOR CONSULTATION:  Pontine CVA.   HISTORY OF PRESENT ILLNESS:  This is a pleasant 77 year old African  American male with a chief complaint at the time of right hand weakness  and speech difficulty.  Patient states that three days ago on Monday,  patient was going to the bank, was signing his checks and noticed he had  a hard time signing with his right hand.  Later in the day, he noticed  that he had some difficulty with his speech.  He stated that his speech  sounded slurred and nasally at the time.  He went throughout the day  with some weak upper extremity and some nasal speech.  He went to sleep.  He woke up the next day on Tuesday.  He noticed he continued to have  weakness in his right hand and dysarthria.  At that time, he thought  nothing of it.  He decided to go to the emergency room on Wednesday  secondary to his dysarthria not improving.  In the emergency room, he  noticed he had right weak grip, difficulty with speech; however, he  states he had no vertigo, no ataxia, no swallowing difficulties, no  diplopia, no vision changes, no decrease in vision, no focal numbness,  no weakness of his neck.  The patient was sent for a CT and MRI, which  showed a left pontine infarct.  Patient was transferred to the second  floor.  The patient has no history of atrial fibrillation or CVA, but he  does have a cardiac history.  A 2D echocardiogram was taken and showed  left ventricular ejection fraction low at 45-50% and hypokinesia of the  left ventricle, possibility of a patent PFO and trivial pericardial  effusion.   PAST SURGICAL  HISTORY:  1. Right BKA.  2. Left second toe amputation.  3. Left femoral bypass graft.  4. Cataracts.  5. Cardiac stent.   PAST MEDICAL HISTORY:  1. Hypertension.  2. Diabetes.  3. Hyperlipidemia.  4. Coronary artery disease.  5. Congestive heart failure.   MEDICATIONS:  1. Aspirin.  2. Lotensin.  3. Coreg.  4. Plavix.  5. Enoxaparin.  6. Amaryl.  7. Insulin.  8. Isosorbide mononitrate.  9. Crestor.  10.Omeprazole.  11.Metformin.   ALLERGIES:  None.   SOCIAL HISTORY:  Patient quit smoking and quit drinking about two years  ago.  No illicit drug use.   REVIEW OF SYSTEMS:  Patient does admit to weight change and weakness.  HEMATOLOGIC:  Anemia.  Denies bleeding or cardiomyopathy.  HEENT:  No  trauma, nausea, vomiting, dizziness, vertigo, diplopia, tinnitus, urine  decrease but positive dysarthria.  RESPIRATORY:  No cough, SOB, asthma,  bronchitis.  CARDIOVASCULAR:  Positive for hypertension.  Negative for  palpitations or dyspnea on exertion, murmur, syncope.  GI:  No diarrhea,  constipation, or abdominal pain.  GU:  Positive for nocturia.  ENDOCRINE:  Positive for diabetes, increase and decreased hunger.  VASCULAR:  Positive for peripheral arterial disease and vascular  surgery.  MUSCULOSKELETAL:  Positive for joint pain, stiffness, below-  knee amputation.  NEUROLOGIC:  Positive for slurred speech and numbness  at times.  PSYCHOLOGY:  Negative for anxiety, depression, decreased  memory.   PHYSICAL EXAMINATION:  Blood pressure 120/60, pulse 58, respiratory rate  20, temperature 97.5.  MENTAL STATUS:  Patient is alert and oriented x3.  Carries out two- and  three-step commands without any difficulty.  Speech is nasal but correct  in content.  Patient understands and expresses well.  CRANIAL NERVES:  Eyes:  Pupils are equal, round and reactive to light  and accommodation.  Conjugate gaze.  External ocular muscles are intact.  Face:  Slightly asymmetric on the right  side with facial droop at the  mouth.  Tongue is midline.  Uvula is midline.  Positive for dysarthria.  No aphasia.  Sensation is globally intact.  Equal bilaterally.  Head to  shoulder shrugs are equal bilaterally.  Coordination:  Finger to nose  has slight end-dysmetria bilaterally.  Heel-to-shin was unable to be  obtained secondary to right below-knee amputation.  Fine finger motion  was equal bilaterally.  Motor:  Slightly weak over the right grip and  right triceps extension.  Overall well preserved, 5/5.  Intrinsic hand  muscles on the right may be slightly weaker but no focal deficit noted.  Patient does not have any drift on the right arm, right leg, left arm,  left leg.  Reflexes:  Patient has downgoing toe on the left, 1+ Achilles  reflex, 1+  left patellar reflex, 2+ biceps, triceps, and brachialis  bilaterally reflexes.  Sensation is globally intact to pinprick, fine  touch, vibration, and temperature.  PULMONARY:  Clear to auscultation.  CARDIOVASCULAR:  Regular rate and rhythm.  NECK:  Negative for bruits.  Supple.   DIET:  Normal.   ACTIVITY:  Normal.   LABS:  HB A1C 9.1.  Cholesterol 161.  Triglycerides 50.  LDL 109.  HDL  42.  TSH 1.4.  Homocysteine 13.9.  UA is negative with the exception of  100 glucose and 30 protein.  Sodium 138, potassium 4.4, bicarb 27,  chloride 105, BUN 17, creatinine 1.07, glucose 146.  White blood cells  6.4, hemoglobin 11, hematocrit 33.5, platelets 205.   IMAGING:  MRI of the head shows subacute left pontine infarct in small  vessel territory.  No mass effect. Intracranial MRA shows diffuse  intracranial atherosclerosis, irregular basilar artery consistent with  atherosclerosis.  Mild-to-moderate stenosis bilateral at the carotid  siphons.   CT scan shows a 5 mm left-sided pontine infarct, probably old at the  time the CT was taken; however, on the diffusion, the way that the MRI  shows that it is actually new.   TREATMENT/PLAN:  A  77 year old male presenting with right-sided weakness  and dysarthria.  At this time, the patient is on Plavix and aspirin.  The patient did show the possibility of patent PFO and hypokinesis of  left  ventricular with decreased ejection fraction.  At this time, we would  recommend that you keep the patient on Plavix and aspirin and have  bubble study with transcranial Doppler on an outpatient basis.  If the  patient does not get an intracranial angiogram while in-house, patient  should get a transcarotid Doppler while in-house.     ______________________________  [name] Morn, PA-C      C. Lesia Sago, M.D.  Electronically Signed    DS/MEDQ  D:  08/22/2007  T:  08/22/2007  Job:  [id number]   cc:   Eduardo Osier. Sharyn Lull, M.D.  Fax: [phone]

## 2010-05-31 NOTE — Assessment & Plan Note (Signed)
OFFICE VISIT   [name], [name]  DOB:  01-31-1931                                       02/19/2007  EAVWU#:[id number]   I saw the patient in the office today for followup after his recent left  fem-pop bypass graft.  This is a pleasant 77 year old gentleman status  post right below the knee amputation, who presented with dry gangrene of  the tip of the left second toe.  He had an arteriogram, which showed a  superficial femoral artery occlusion and reconstitution of the above  knee popliteal artery with severe tibial occlusive disease distally.  He  underwent a left fem to above knee pop bypass on January 31, 2007 with  left second toe amputation.  He comes in for routine followup visit.   Overall, he has been doing quite well and he has been ambulating with  his Darco shoe.  He has no specific complaints.   PHYSICAL EXAMINATION:  His incisions have healed nicely.  There is a  palpable popliteal pulse on the left with a brisk posterior signal and  also a monophasic anterior tibial and peroneal signal.  He has known  severe tibial occlusive disease.  The toe amputation site looks  reasonable.  I did remove the sutures in the office today.  I encouraged  him to continue to wash the wound with soap and water, and apply  bacitracin and gauze to keep this clean and dry.  I have allowed him to  ambulate with a regular shoe on the left, as long as it fits well and he  does not need to continue using the Darco shoe at this point.  It makes  it quite difficult for him to ambulate given his prosthesis on the right  side.   Overall, I am pleased with his progress, and I will see him back in 2  weeks to continue to follow the toe amputation site.   Di Kindle. Edilia Bo, M.D.  Electronically Signed   CSD/MEDQ  D:  02/19/2007  T:  02/21/2007  Job:  [id number]   cc:   Eduardo Osier. Sharyn Lull, M.D.

## 2010-05-31 NOTE — Procedures (Signed)
BYPASS GRAFT EVALUATION   INDICATION:  Follow-up left lower extremity bypass graft.   HISTORY:  Diabetes:  Yes.  Cardiac:  No.  Hypertension:  Yes.  Smoking:  No.  Previous Surgery:  Left femoral-popliteal artery bypass graft  01/31/2007, right BKA 06/09/2001.   SINGLE LEVEL ARTERIAL EXAM                               RIGHT              LEFT  Brachial:                    177                180  Anterior tibial:                                142  Posterior tibial:                               138  Peroneal:  Ankle/brachial index:        BKA                0.79   PREVIOUS ABI:  Date: 01/21/2008  RIGHT:  BKA  LEFT:  0.81   LOWER EXTREMITY BYPASS GRAFT DUPLEX EXAM:   DUPLEX:  Doppler arterial waveforms appear biphasic proximal to, within  and distal to left lower extremity bypass graft.   IMPRESSION:  1. Right BKA.  2. Left ABI appears stable from previous study.  3. Left femoral-popliteal artery bypass graft appears patent.  4. No significant changes from previous study.   ___________________________________________  Di Kindle. Edilia Bo, M.D.   AS/MEDQ  D:  07/21/2008  T:  07/21/2008  Job:  [id number]

## 2010-05-31 NOTE — Procedures (Signed)
BYPASS GRAFT EVALUATION   INDICATION:  Follow up bypass graft evaluation.   HISTORY:  Diabetes:  Yes.  Cardiac:  No.  Hypertension:  Yes.  Smoking:  No.  Previous Surgery:  Left femoral-to-popliteal bypass graft on 01/31/2007,  right below-knee amputation on 06/09/2001.   SINGLE LEVEL ARTERIAL EXAM                               RIGHT              LEFT  Brachial:                    183                183  Anterior tibial:                                149  Posterior tibial:                               123  Peroneal:  Ankle/brachial index:                           0.81   PREVIOUS ABI:  Date: 11/05/2007  RIGHT:  LEFT:  0.78   LOWER EXTREMITY BYPASS GRAFT DUPLEX EXAM:   DUPLEX:  1. Biphasic duplex waveforms noted from the left native inflow artery      within the bypass graft and native outflow artery.  2. Patent left femoral-to-popliteal artery bypass graft with no      evidence of stenosis.   IMPRESSION:  Ankle brachial indices suggest mild disease of the left  lower extremity with biphasic Doppler waveforms.   ___________________________________________  Di Kindle. Edilia Bo, M.D.   AC/MEDQ  D:  01/21/2008  T:  01/21/2008  Job:  [id number]

## 2010-05-31 NOTE — Procedures (Signed)
BYPASS GRAFT EVALUATION   INDICATION:  Left fem-pop bypass graft with PTFE on 01/31/07.   HISTORY:  Diabetes:  Yes.  Cardiac:  No.  Hypertension:  Yes.  Smoking:  No.  Previous Surgery:  Left fem-pop bypass graft with PTFE on 01/31/07 by  Dr. Edilia Bo.  Right below-knee amputation on 06/09/01.   SINGLE LEVEL ARTERIAL EXAM                               RIGHT              LEFT  Brachial:                    185                186  Anterior tibial:                                156  Posterior tibial:                               97  Peroneal:  Ankle/brachial index:                           0.84   PREVIOUS ABI:  Date: 07/29/03  RIGHT:  LEFT:  0.54 (preop)   LOWER EXTREMITY BYPASS GRAFT DUPLEX EXAM:   DUPLEX:  Doppler arterial waveforms are biphasic proximal to and  monophasic within and distal to the left fem-pop bypass graft.   IMPRESSION:  1. Left ankle brachial index is higher than previously recorded      (preoperative).  2. Patent left femoropopliteal bypass graft.     ___________________________________________  Di Kindle. Edilia Bo, M.D.   MC/MEDQ  D:  05/07/2007  T:  05/07/2007  Job:  [id number]

## 2010-05-31 NOTE — Op Note (Signed)
NAMEGERRALD, BASU NO.:  [id number]   MEDICAL RECORD NO.:  [id number]          PATIENT TYPE:  INP   LOCATION:  2550                         FACILITY:  [facility]   PHYSICIAN:  Di Kindle. Edilia Bo, M.D.DATE OF BIRTH:  Aug 17, 1931   DATE OF PROCEDURE:  01/31/2007  DATE OF DISCHARGE:                               OPERATIVE REPORT   PREOPERATIVE DIAGNOSIS:  Dry gangrene of the left second toe and also  superficial femoral artery occlusion and severe tibial occlusive  disease.   POSTOPERATIVE DIAGNOSIS:  Dry gangrene of the left second toe and also  superficial femoral artery occlusion and severe tibial occlusive  disease.   PROCEDURE:  1. Harvesting of left greater saphenous vein.  2. Left femoral to above-knee popliteal artery bypass with a 6-mm PTFE      Propaten graft.  3. Intraoperative arteriogram.  4. Left second toe amputation.   SURGEON:  Di Kindle. Edilia Bo, M.D.   ASSISTANT:  Gae Bon, PA   ANESTHESIA:  General.   INDICATIONS:  This is a 77 year old gentleman status post previous right  below-the-knee amputation in May 23, 2001.  He was admitted to [facility] on December 30th with a wound on the left second toe.  He had  dry gangrene with some cellulitis which responded to IV antibiotics.  He  underwent arteriogram which showed a superficial femoral artery  occlusion with reconstitution in the above-knee popliteal artery, had  severe tibial occlusive disease with no vessels distally for bypass.  It  was felt that his best chance for limb salvage was fem-pop bypass  grafting to improve collateral flow into the foot.  The procedure,  potential complications including but not limited to, graft thrombosis,  limb loss, wound healing problems and bleeding were all discussed with  the patient preoperatively.  All his questions were answered.  He was  agreeable to proceed.   TECHNIQUE:  The patient was taken to the operating room, received  a  general anesthetic.  Left lower extremity and left foot were prepped and  draped in usual sterile fashion.  After the skin incision was made  obliquely above the inguinal crease on the left, the common femoral,  superficial femoral and deep femoral arteries were dissected free and  controlled with vessel loops.  The artery was soft had an excellent  pulse.  The saphenofemoral junction was dissected free and using three  additional incisions along the medial aspect of the left leg, greater  saphenous vein was harvested down to below the knee.  A separate  incision was made for exposure of the above-knee popliteal artery as  this otherwise would have created a significant skin flap.  The  popliteal artery above the knee was exposed.  The tunnel was then  created between the two incisions.  The patient received 8000 units of  IV heparin.  The vein was then removed and distended up with heparinized  saline.  Of note it was somewhat sclerotic and the largest I could get  some segments to expand was to 3.5 to 4 mm.  Given the multiple  branch  points and the fact that the vein was small and somewhat sclerotic, I  felt that this was not the ideal conduit and elected to place a 6 mm  Propaten graft.  The 6 mm Propaten graft was tunneled between the two  incisions.  The common femoral, superficial femoral and deep femoral  arteries were controlled.  A longitudinal arteriotomy was made in the  common femoral artery.  The graft was spatulated and sewn end-to-side to  the common femoral artery using continuous 6-0 Prolene suture.  A  proximal radiopaque marker was placed for later identification.  The  graft was then pulled to the appropriate length for anastomosis to the  above-knee popliteal artery.  Tourniquet was placed on the thigh.  The  leg was exsanguinated with an Esmarch bandage and tourniquet inflated to  250 mmHg.  Under tourniquet control, the longitudinal arteriotomy was  made in the  above-knee popliteal artery.  The graft was cut to  appropriate length, spatulated and sewn end-to-side to the above-knee  popliteal artery using continuous 6-0 Prolene suture.  At completion  there was a graft dependent peroneal dorsalis pedis and posterior tibial  signal at the Doppler.  Intraoperative arteriogram was obtained which  showed no technical problems and confirmed severe tibial occlusive  disease diffusely.  Next hemostasis was obtained in the wounds.  The  groin incision and the above-the-knee incision were closed with deep  layer of 2-0 Vicryl, subcutaneous layer with 2-0 Vicryl.  The skin  closed with staples.  The remaining incisions were closed with deep  layer of 3-0 Vicryl.  The skin closed with staples.   Sterile dressing was applied to the incisions and the foot was uncovered  which had been previously prepped sterilely.  A fishmouth incision was  made encompassing the left second toe.  The bone was divided and  rongeured back to healthy bone.  The wound was irrigated with copious  amounts of saline.  The hemostasis was obtained in the wound and the  wound was then closed with interrupted 3-0 nylons.  Sterile dressing was  applied.  The patient tolerated procedure well, was transferred to  recovery in satisfactory condition.  All needle and sponge counts were  correct.      Di Kindle. Edilia Bo, M.D.  Electronically Signed     CSD/MEDQ  D:  01/31/2007  T:  01/31/2007  Job:  [id number]   cc:   Eduardo Osier. Sharyn Lull, M.D.

## 2010-05-31 NOTE — Procedures (Signed)
BYPASS GRAFT EVALUATION   INDICATION:  Follow up left fem-pop bypass graft.  History of right BKA.   HISTORY:  Diabetes:  Yes.  Cardiac:  No.  Hypertension:  Yes.  Smoking:  No.  Previous Surgery:  Left femoropopliteal bypass graft, 01/31/2007.   SINGLE LEVEL ARTERIAL EXAM                               RIGHT              LEFT  Brachial:                    195                187  Anterior tibial:                                180  Posterior tibial:                               157  Peroneal:  Ankle/brachial index:        BKA                0.92   PREVIOUS ABI:  Date: 02/11/09  RIGHT:  LEFT:  0.66   LOWER EXTREMITY BYPASS GRAFT DUPLEX EXAM:   DUPLEX:  Monophasic Doppler waveforms throughout the bypass graft and  native arteries.   IMPRESSION:  Patent bypass graft with no evidence of stenosis.   ___________________________________________  Di Kindle. Edilia Bo, M.D.   EM/MEDQ  D:  10/13/2009  T:  10/13/2009  Job:  [id number]

## 2010-05-31 NOTE — Consult Note (Signed)
NAMEALEXANDR, [name] NO.:  [id number]   MEDICAL RECORD NO.:  [id number]          PATIENT TYPE:  INP   LOCATION:  4743                         FACILITY:  [facility]   PHYSICIAN:  Di Kindle. Edilia Bo, M.D.DATE OF BIRTH:  Jul 02, 1931   DATE OF CONSULTATION:  01/16/2007  DATE OF DISCHARGE:                                 CONSULTATION   REASON FOR CONSULTATION:  Left second toe wound.  Consult is from Dr.  Eduardo Osier. Harwani.   HISTORY:  This is a pleasant 77 year old gentleman who is actually well-  known to me.  I performed a right below-the-knee amputation in May of  2003 on the patient.  He was admitted on January 15, 2007, with a left  second toe wound.  He was admitted with swelling of the foot and has  been on intravenous antibiotics.  Vascular surgery was consulted for  further recommendations.   The patient tells me that he had been shining his shoes and had left  something in his shoe.  He had then worn the shoe without knowing it,  injured the left second toe.  He has had this wound for 2 weeks.  He  denies any history of fever or chills.  He does not think that he has  had any significant drainage from the foot.  Prior to this he was  ambulatory with his prosthesis and did not give any history of left leg  claudication or rest pain in the left leg.   PAST MEDICAL HISTORY:  Significant for non-insulin-dependent diabetes,  hypertension, hypercholesterolemia, coronary artery disease and  congestive heart failure.  He denies any history of previous myocardial  infarction or history of COPD.   PAST SURGICAL HISTORY:  Significant for appendectomy in the past and  also right below-the-knee amputation as described above.   FAMILY HISTORY:  He is unaware of any history of premature  cardiovascular disease.  His father did die with congestive heart  failure.  His mother also died with congestive heart failure at age 64.   SOCIAL HISTORY:  He is married.  He  works as a Arboriculturist.  He does not  smoke cigarettes and never has.   MEDICATIONS AND ALLERGIES:  Are documented on his admission history and  physical.   REVIEW OF SYSTEMS:  GENERAL:  He has had no weight loss, weight gain,  fever or chills.  CARDIAC:  He denies any history of chest pain, chest  pressure, palpitations or arrhythmias.  PULMONARY:  He has had no recent  bronchitis, asthma or wheezing.  GI:  He has had no recent change in his  bowel habits and has no history of peptic ulcer disease.  GU:  He has  had no dysuria or frequency.  ORTHO:  He has had no muscle pain, joint  pain or rash.  NEUROLOGIC:  He has had no dizziness, blackouts,  headaches or seizures.  VASCULAR:  He has had no history of DVT or  phlebitis.  He has had no history of stroke, TIA or amaurosis fugax.  HEMATOLOGIC:  He has had no bleeding  problems or clotting disorders.   PHYSICAL EXAMINATION:  VITAL SIGNS:  Temperature is 98.2, blood pressure  127/71, heart rate is 59.  GENERAL:  This is a pleasant 77 year old gentleman who appears his  stated age.  HEENT:  Extraocular motions are intact.  NECK:  Supple.  There are no carotid bruits.  LUNGS:  Lungs are clear bilaterally to auscultation.  CARDIAC:  He has a regular rate and rhythm.  ABDOMEN:  Soft and nontender.  I cannot appreciate an abdominal bruit.  He has normal pitch bowel sounds.  EXTREMITIES:  He has normal femoral pulses.  His right below-the-knee  amputation has healed nicely.  I cannot palpate popliteal or pedal  pulses on the left.  He does have monophasic dorsalis pedis, posterior  tibial and peroneal signal on the left.  He has dry gangrene of the left  second toe.  Currently there is no drainage.  He has no significant leg  swelling currently.  NEUROLOGIC:  Exam is nonfocal.  He has good strength in his left leg and  his upper extremities.  He has no rashes.   IMPRESSION:  This patient presents with a nonhealing left second toe  wound  with dry gangrene.  Based on his exam I think he probably does not  have adequate circulation to heal a second toe amputation.  I have  worked ankle-brachial indices and toe pressures to help to predict  healing but I suspect that this will show that he has an inadequate  flow.  Tentatively we plan on proceeding with arteriography on Monday  and this can be done as an outpatient.  We will do an aortogram with  left leg runoff to determine if he is a candidate for revascularization.  I suspect that his best chance for limb salvage is left leg bypass and  toe amputation.  Of note, currently his white count is 6.  I will also  obtain an x-ray to rule out osteomyelitis.  He does have a culture which  showed some moderate gram positive cocci.      Di Kindle. Edilia Bo, M.D.  Electronically Signed     CSD/MEDQ  D:  01/16/2007  T:  01/16/2007  Job:  [id number]   cc:   Eduardo Osier. Sharyn Lull, M.D.

## 2010-05-31 NOTE — Assessment & Plan Note (Signed)
OFFICE VISIT   [name], [name]  DOB:  24-Nov-1931                                       10/13/2009  ZOXWR#:[id number]   I saw the patient in the office today for continued follow up of his  peripheral vascular disease.  He has undergone a previous right below-  the-knee amputation in 2003.  In January 2009 he presented with a dry  gangrene of the left second toe and he underwent a left femoral to above-  knee pop bypass with a 6-mm Propaten graft and left second toe  amputation.  He comes in for routine follow-up visit.  Of note, in early  September he was hospitalized with complete heart block and a small non-  Q-wave MI.  He had a pacemaker placed and has done well from that  standpoint.  With respect to his leg symptoms, he is ambulatory with his  prosthesis.  He has had no significant left leg claudication.  He has  had no rest pain and no history of nonhealing wounds on the left leg.   PAST MEDICAL HISTORY:  Significant for hypertension, non-insulin-  dependent diabetes and hypercholesterolemia, all of which are stable on  his current medications.   SOCIAL HISTORY:  He is married.  He does not smoke cigarettes.   REVIEW OF SYSTEMS:  CARDIOVASCULAR:  He had no recent chest pain, chest  pressure, palpitations.  He has had no history of stroke or TIA.  PULMONARY:  He had no productive cough, bronchitis, asthma or wheezing.   PHYSICAL EXAMINATION:  This is a pleasant 77 year old gentleman who  appears his stated age.  His blood pressure is 187/72, heart rate is 72,  saturation 98%.  Lungs:  Clear bilaterally to auscultation.  Cardiovascular:  I do not detect any carotid bruits.  He has a regular  rate and rhythm.  He has palpable femoral pulses and a palpable  popliteal pulse on the left.  The left foot is warm and well-perfused  without ischemic ulcers.  Neurologic:  He has no focal weakness or  paresthesias.   I did independently interpret his arterial  Doppler study today which  shows monophasic Doppler signals in the left foot in posterior tibial  and dorsalis pedis position.  ABI on the left is 92%.  I have also  independently interpreted his duplex scan of his graft which shows a  patent left fem-pop bypass graft with only mildly elevated velocities in  the proximal graft.   Overall, I am pleased with his progress.  They will scan his graft again  in 6 months.  I will plan on seeing him back in 1 year.  He knows to  call sooner if he has problems.     Di Kindle. Edilia Bo, M.D.  Electronically Signed   CSD/MEDQ  D:  10/13/2009  T:  10/14/2009  Job:  [id number]

## 2010-05-31 NOTE — Procedures (Signed)
BYPASS GRAFT EVALUATION   INDICATION:  Follow-up left lower extremity bypass graft.   HISTORY:  Diabetes:  Yes.  Cardiac:  No.  Hypertension:  Yes.  Smoking:  No.  Previous Surgery:  Left fem-pop bypass graft on 01/31/2007, right below-  knee amputation on 06/09/2001.   SINGLE LEVEL ARTERIAL EXAM                               RIGHT              LEFT  Brachial:                    185                191  Anterior tibial:                                149  Posterior tibial:                               135  Peroneal:  Ankle/brachial index:                           0.78   PREVIOUS ABI:  Date: 08/06/2007  RIGHT:  LEFT:  0.85   LOWER EXTREMITY BYPASS GRAFT DUPLEX EXAM:   DUPLEX:  Biphasic Doppler waveforms noted throughout the left lower  extremity bypass graft and native vessels with increased Doppler  velocity of 223 cm/s noted in the left common femoral artery.   IMPRESSION:  1. Patent left femoral-popliteal bypass graft with increased left      common femoral artery velocity noted, as described above.  2. Stable left ankle-brachial index noted.   ___________________________________________  Di Kindle. Edilia Bo, M.D.   CH/MEDQ  D:  11/05/2007  T:  11/05/2007  Job:  [id number]

## 2010-05-31 NOTE — Assessment & Plan Note (Signed)
OFFICE VISIT   [name], [name]  DOB:  [birth year]                                       03/05/2007  EAVWU#:[id number]   I saw the patient in the office today for continued followup after his  recent left fem-pop bypass graft and left second toe amputation.   Since I saw him last, his sutures have all been removed, and he has been  ambulating without difficulty.  He has continued to have some swelling  in the left leg.   EXAMINATION:  Blood pressure is 182/81, heart rate is 58.  He has a  palpable popliteal pulse with a warm foot, and he does have tibial  occlusive disease and toe amputation site.  He is improving with only 1  small open area with no drainage or erythema.   We have discussed the importance of intermittent leg elevation, and I  have encouraged him to elevate his leg 2 to 3 times a day, as I think  this will help healing also.  Will plan on seeing him back in 2 months.  He knows to call sooner if he has problems.   Di Kindle. Edilia Bo, M.D.  Electronically Signed   CSD/MEDQ  D:  03/05/2007  T:  03/07/2007  Job:  [id number]

## 2010-05-31 NOTE — H&P (Signed)
NAMEJORGE, RETZ NO.:  [id number]   MEDICAL RECORD NO.:  [id number]           PATIENT TYPE:   LOCATION:                                 FACILITY:   PHYSICIAN:  Di Kindle. Edilia Bo, M.D.DATE OF BIRTH:  04/03/1931   DATE OF ADMISSION:  01/31/2007  DATE OF DISCHARGE:                              HISTORY & PHYSICAL   REASON FOR ADMISSION:  Dry gangrene of the left second toe.   HISTORY:  This is a pleasant 77 year old gentleman who I had previously  performed a right below-the-knee amputation on in May 2003.  He was  admitted to [facility] on December 30 with a left second toe wound.  He was placed on intravenous antibiotics and the swelling and redness in  his foot improved significantly.  When I saw him all he basically had  was dry gangrene of the tip of the left second toe.  The patient states  that he had been shining his shoes and had left something in his shoe  that he was unaware of because of some neuropathy.  He later developed a  wound of the left second toe which he has had for about 3 weeks.  He has  had no fever or chills.  He cannot remember having a significant  drainage from the foot.  Prior to this, he was ambulatory with his  prosthesis and did not give any history of left leg claudication or rest  pain in the left leg.   PAST MEDICAL HISTORY:  1. Significant for non-insulin dependent diabetes.  2. Hypertension.  3. Hypercholesterolemia.  4. Coronary artery disease.  5. Congestive heart failure.  Denies any history of previous      myocardial infarction or history of COPD.   PAST SURGICAL HISTORY:  Significant for an appendectomy in the past and  also a right below-the-knee amputation as described above.   FAMILY HISTORY:  The patient is unaware of any history of premature  cardiovascular disease.  His father died with congestive heart failure.  His mother also died with congestive heart failure at age 83.   SOCIAL HISTORY:  He  is married.  He works as a Arboriculturist.  He does not  smoke cigarettes and never has.   MEDICATIONS:  The patient is to bring his medications with him at the  time of admission.   REVIEW OF SYSTEMS:  GENERAL:  He had no recent weight loss, weight gain  problems with his appetite.  He had no fever or chills.  CARDIAC:  He  denies any history of chest pain, chest pressure, palpitations or  arrhythmias.  PULMONARY:  He has had no recent bronchitis, asthma or  wheezing.  GI:  He has had no recent change in his bowel habits and has  no history of peptic ulcer disease.  GU:  He has had no dysuria or  frequency.  ORTHO:  He has had no muscle pain, joint pain or rash.  NEUROLOGIC:  He has had no dizziness, blackouts, headaches or seizures.  VASCULAR:  He has had no history of DVT  or phlebitis.  He has had no  history of stroke, TIA or amaurosis fugax.  HEMATOLOGIC:  He has had no  bleeding problems or clotting disorders.   PHYSICAL EXAMINATION:  VITAL SIGNS:  Temperature is 98.6, blood pressure  is 122/68, heart rate is 60.  GENERAL:  This is a pleasant 77 year old gentleman who appears his  stated age.  HEENT:  Extraocular motions are intact.  He has no cervical  lymphadenopathy.  NECK:  I do not detect any carotid bruits.  RESPIRATORY:  Lungs are clear bilaterally to auscultation.  CARDIOVASCULAR:  On cardiac exam, he has a regular rate and rhythm.  ABDOMEN: Soft and nontender.  I could not appreciate an abdominal bruit.  He has normal pitched bowel sounds.  VASCULAR:  Reveals normal femoral pulses.  He has a right below-the-knee  amputation on the right which has healed nicely.  I cannot palpate  popliteal or pedal pulses on the left.  He does have a monophasic  dorsalis pedis, posterior tibial and peroneal signal on the left.  He  has dry gangrene of the tip of the left second toe.  EXTREMITIES:  There is no drainage currently.  No erythematous  currently.  There is no significant lower  extremity swelling.  NEUROLOGIC:  Exam is nonfocal.  He has good strength in his upper  extremities and lower extremities bilaterally.   Arteriogram performed on January 21, 2007 showed a widely patent  aortoiliac system with no inflow disease.  On the left side he had an  occluded superficial femoral artery at its origin with reconstitution of  the above-knee popliteal artery.  He had severe tibial occlusive disease  with occluded anterior tibial and posterior tibial arteries.  Peroneal  artery was essentially occluded proximally with some reconstitution  distally which then filled the distal posterior tibial and a short  segment of dorsalis pedis.  Both the distal posterior tibial and  dorsalis pedis are small and diseased.   I have recommended fem-pop bypass grafting as his best option for limb  salvage.  This is essentially to a blind popliteal segment but hopefully  will improve the circulation enough to heal a toe amputation of the left  second toe.  He understands that there is risk of nonhealing in the risk  of limb loss on the left side also.  However, I think without  revascularization the toe will progress and he will ultimately require  amputation.  Likewise, if we were to simply amputate the toe at this  time I do not think this would heal and be simply left with a wound  further back.  His surgery and admission has been scheduled for January 30, 2006.      Di Kindle. Edilia Bo, M.D.  Electronically Signed     CSD/MEDQ  D:  01/22/2007  T:  01/22/2007  Job:  [id number]   cc:   Eduardo Osier. Sharyn Lull, M.D.

## 2010-05-31 NOTE — Procedures (Signed)
BYPASS GRAFT EVALUATION   INDICATION:  Follow up left fem-pop bypass graft.   HISTORY:  Diabetes:  Yes.  Cardiac:  No.  Hypertension:  Yes.  Smoking:  No.  Previous Surgery:  Left fem-pop bypass graft on 01/31/2007, right below-  knee amputation on 06/09/2001.   SINGLE LEVEL ARTERIAL EXAM                               RIGHT              LEFT  Brachial:                    200                202  Anterior tibial:                                172  Posterior tibial:                               168  Peroneal:  Ankle/brachial index:        BKA                0.85   PREVIOUS ABI:  Date: 05/07/2007  RIGHT:  LEFT:  0.84   LOWER EXTREMITY BYPASS GRAFT DUPLEX EXAM:   DUPLEX:  Biphasic to monophasic waveforms noted throughout the left  lower extremity bypass graft and its native vessels with no focal  increase in velocities visualized.   IMPRESSION:  1. Patent left fem-pop bypass graft with no evidence of stenosis.  2. Stable left ankle brachial index noted.  3. No significant change noted from the previous exam on 05/07/2007.   ___________________________________________  Di Kindle. Edilia Bo, M.D.   CH/MEDQ  D:  08/06/2007  T:  08/06/2007  Job:  [id number]

## 2010-06-03 NOTE — H&P (Signed)
[place]  Patient:    [name], [name] Visit Number: [id number] MRN: [id number]          Service Type: MED Location: [phone] Attending Physician:  Bennye Alm Dictated by:   Di Kindle. Edilia Bo, M.D. Admit Date:  06/05/2001   CC:         Eduardo Osier. Sharyn Lull, M.D.  Sherri Rad, M.D.   History and Physical  REASON FOR ADMISSION:  Gangrene of the right lower extremity.  HISTORY OF PRESENT ILLNESS:  This is a 77 year old gentleman who apparently developed a wound on the plantar aspect of his right foot several weeks ago. He was apparently to undergo surgery today, but when he came in for his procedure, he had had rapid progression of ischemia of the right foot now with three gangrenous toes and a black eschar on the dorsum of the right foot.  He had significant drainage from the plantar aspect of the foot and the entire foot was swollen and appeared ischemic looking.  Sherri Rad, M.D., was scheduled to perform his surgery, but canceled his surgery and he was sent to our office for further evaluation.  The patient states that the wound all began with a callous on the plantar aspect of his right foot.  He does not remember any specific injury to the foot.  He denies any history of claudication or rest pain prior to this problem with the foot.  PAST MEDICAL HISTORY:  Significant for type 2 diabetes which he has had for six years.  In addition, he has a history of hypertension.  He denies any history of hypercholesterolemia.  He denies any history of previous myocardial infarction.  He does state that he has a history of congestive heart failure.  FAMILY HISTORY:  He denies any history of premature cerebrovascular disease. His father did die with congestive heart failure.  Also, his mother died at age 36 with congestive heart failure.  Both of his parents were diabetic.  SOCIAL HISTORY:  He is married.  He works as a  Arboriculturist.  He does not smoke cigarettes and never.  He does not use alcohol on a regular basis.  MEDICATIONS: 1. Amaryl 4 mg p.o. q.d. 2. Altace 10 mg p.o. q.d. 3. Lasix 40 mg p.o. q.d. 4. Lipitor 20 mg p.o. q.d. 5. Aspirin 81 mg p.o. q.d.  ALLERGIES:  The patient denies any drug allergies.  REVIEW OF SYSTEMS:  The patient has had some mild weight loss recently.  He has had no problems with his appetite.  He denies any history of chest pain, chest pressure, or dyspnea on exertion.  He has no history of palpitations. He denies any history of bronchitis, asthma, or wheezing.  He has had no change in his bowel habits and denies any history of peptic ulcer disease.  He denies any history of dysuria or frequency.  He has had no history of DVT or phlebitis.  He denies any history of blackouts, headaches, or seizures.  He has had no bleeding problems or clotting disorders that he is aware of.  He has had no arthritis or joint pain.  He denies any history of depression or nervousness.  PHYSICAL EXAMINATION:  The blood pressure is 180/100 and the heart rate is 100.  NECK:  No do not detect any carotid bruits.  There is no cervical lymphadenopathy.  LUNGS:  Clear bilaterally to auscultation.  CARDIAC:  He has a regular rate and rhythm.  ABDOMEN:  Soft and nontender.  I cannot palpate an aneurysm.  EXTREMITIES:  He has palpable femoral pulses.  I cannot palpate any distal pulses.  He does not have popliteal pulses.  He has extensive gangrene of the right foot with darkened second, third, and fourth toes in the right foot and a large eschar on the dorsum of the right foot.  The foot is erythematous and swollen.  There is a wound in the posterior aspect on the plantar surface.  He likely has a deep space infection.  NEUROLOGIC:  Nonfocal exam.  IMPRESSION:  This patient presents with rapid ischemia of his right lower extremity.  PLAN:  The plan is to admit him today for IV  antibiotics with an open transmetatarsal amputation tomorrow.  If it looks like there is any chance of salvaging the heel, then we will proceed with an arteriogram on Friday to determine if he is a candidate for revascularization.  If it looks there is no chance of salvaging the foot, then we would probably not proceed with an arteriogram unless it was felt that he might need revascularization just to heel a below-knee amputation.  We will admit him and place him Zosyn.  He will undergo dressing changes for now pending his debridement in the OR tomorrow. Dictated by:   Di Kindle Edilia Bo, M.D. Attending Physician:  Bennye Alm DD:  06/05/01 TD:  06/05/01 Job: [id number]

## 2010-06-03 NOTE — Discharge Summary (Signed)
NAMEJAMIEN, [name] NO.:  [id number]   MEDICAL RECORD NO.:  [id number]          PATIENT TYPE:  INP   LOCATION:  6729                         FACILITY:  [facility]   PHYSICIAN:  Ricki Rodriguez, M.D.  DATE OF BIRTH:  Feb 20, 1931   DATE OF ADMISSION:  08/20/2007  DATE OF DISCHARGE:  08/25/2007                               DISCHARGE SUMMARY   The patient admitted by Dr. Rinaldo Cloud.   FINAL DIAGNOSES:  1. Cerebral artery occlusion with cerebral infarct.  2. Subacute left pontine infarction.  3. Dysarthria.  4. Cerebral atherosclerosis.  5. Renal insufficiency.  6. Hypertension.  7. Diabetes mellitus, type 2.  8. Congestive heart failure, systolic, left heart.  9. Pure hypercholesterolemia.  10.Coronary atherosclerosis of native coronary vessel.  11.Peripheral vascular disease.  12.Chronic use of aspirin.  13.Status post coronary angioplasty status.   DISCHARGE MEDICATIONS:  1. Metformin 1000 mg one twice daily.  2. Carvedilol 12.5 mg one twice daily.  3. Benazepril 20 mg one twice daily.  4. Aspirin 325 mg one daily.  5. Plavix 75 mg one daily.  6. Lipitor 80 mg one daily.  7. Prilosec 20 mg one daily.  8. Nitro-Dur patch 0.4 mg per hour, apply new one in a.m. off at      bedtime.   Follow up with Dr. Sharyn Lull in 2 weeks.  The patient to call [phone] for  appointment and by Dr. Pearlean Brownie, neurologist, in 1 month.   DISCHARGE DIET:  Low-sodium heart-healthy diet.   DISCHARGE ACTIVITY:  The patient to increase activity slowly.   SPECIAL INSTRUCTION:  The patient to stop any activity that causes chest  pain, shortness of breath, dizziness, sweating, or excessive weakness.   HISTORY:  This 77 year old black male presented with right hand weakness  and speech difficulty while he was trying to sign a check at the bank.  The patient has known history of diabetes, coronary artery disease,  hyperlipidemia, and peripheral vascular disease.   PHYSICAL  EXAMINATION:  VITAL SIGNS:  Pulse 85, blood pressure 166/82.  GENERAL:  The patient was alert and oriented x3, in no acute distress.  HEENT:  Conjunctivae pink.  NECK:  Supple.  No JVD.  No bruits.  LUNGS:  Clear to auscultation with rhonchi.  CARDIOVASCULAR:  Regular rate and rhythm.  Normal S1 and S2.  ABDOMEN:  Soft.  Bowel sounds present.  Nontender.  EXTREMITIES:  No edema, cyanosis, or clubbing.  The patient has right  below-the-knee amputation.  NEUROLOGIC:  Cranial nerves are grossly intact.  Strength 5/5  bilaterally.   LABORATORY DATA:  Near normal hemoglobin/hematocrit, WBC count, platelet  count.  Normal electrolytes, BUN, and creatinine.  Sugar elevated at  195.  Subsequent sugar was down to 109 and on day of discharge 73.  Liver enzymes near normal with a slightly low albumin of 3.0.  Glycosylated hemoglobin was 9.1.  Total cholesterol was 161,  triglyceride 50, HDL cholesterol of 42, LDL cholesterol of 109.  Thyroid  stimulating hormone was 1.4.   MRI of brain showed 90%-95% stenosis of the inferior division of  the  left middle cerebral artery at its origin, 70% of the left middle  cerebral artery and 50%-60% stenosis of the internal carotid artery as  well.   Chest x-ray revealed cardiac enlargement without heart failure.   Ultrasound of the heart revealed 45%-50% LV systolic function with left  ventricular wall hypertrophy, mildly calcified aortic valve, and trivial  pericardial effusion.   HOSPITAL COURSE:  The patient was admitted to telemetry unit and  Neurology consult was obtained.  The patient underwent MRI and MRA of  the brain.  He was treated with aspirin and Plavix.  His condition  significantly improved over 72-96 hours.  The patient was scheduled for  possible angioplasty of his left middle cerebral artery stenosis on  August 25, 2007.  He was discharged home in improved condition with a  followup by interventional radiologist in 1 month, by primary  care  physician in 2 weeks, and by neurologist in 1 month.      Ricki Rodriguez, M.D.  Electronically Signed     ASK/MEDQ  D:  11/16/2007  T:  11/16/2007  Job:  [id number]

## 2010-06-03 NOTE — Op Note (Signed)
[place]. [facility]  Patient:    [name], DEESE Visit Number: [id number] MRN: [id number]          Service Type: DSU Location: *N Attending Physician:  Bennye Alm Dictated by:   Di Kindle. Edilia Bo, M.D. Proc. Date: 06/06/01   CC:         Sherri Rad, M.D.   Operative Report  PREOPERATIVE DIAGNOSIS:  Gangrene of the right foot.  POSTOPERATIVE DIAGNOSIS:  Gangrene of the right foot.  PROCEDURE:  Open right transmetatarsal amputation.  SURGEON:  Di Kindle. Edilia Bo, M.D.  ASSISTANT:  Sherrie George, P.A.  ANESTHESIA:  General.  INDICATION:  This is a 77 year old gentleman who presented with rapidly progressive ischemia and infection of the right foot.  He had been scheduled for removal of an infected segment of bone by Dr. Lestine Box but when he showed up for surgery, he was found to have a swollen right foot with gangrene of the second, third, and fourth toes and a large black eschar on the dorsum of his foot.  He was sent over for vascular evaluation.  I recommended that he be admitted for IV antibiotics and undergo an open amputation of the forefoot. He understood from the beginning that there was a good chance that this would be salvageable and that he would likely require a more proximal amputation.  DESCRIPTION OF PROCEDURE:  The patient was brought to the operating room and received a general anesthetic.  The right foot was prepped and draped in the usual sterile fashion.  The great toe appeared to possibly be viable; therefore, initially I only made an incision encompassing the second, third, and fourth toes in addition to the fifth toe, which had also become ischemic, in addition to the large black eschar on the dorsum of the foot.  This dissection was carried down to the bone, which was then divided.  The infection involved the lateral aspect of the right first metatarsal, and therefore it was felt that the first  toe would also have to go.  The infection was quite extensive and tracked down the tendon sheath both on the plantar aspect of the forefoot but also on the dorsum of the foot, compromising the skin over the majority of the forefoot.  For this reason I elected to do a more proximal amputation to encompass all of the infected tissue.  Essentially I had to go back through the navicular bone.  The incision was made back to what appeared to possibly be healthy-appearing skin, and this was carried down through the subcutaneous tissue down to the bone, which was divided using a reciprocating saw.  The tissue had some bleeding.  Hemostasis was attained using electrocautery.  All of the infection was debrided and the wound was irrigated with copious amounts of antibiotic solution.  The wound was then packed open.  A sterile dressing was applied.  The patient tolerated the procedure well and was transferred to the recovery room in satisfactory condition.  All needle and sponge counts were correct. Dictated by:   Di Kindle Edilia Bo, M.D. Attending Physician:  Bennye Alm DD:  06/06/01 TD:  06/10/01 Job: [id number]

## 2010-06-03 NOTE — Cardiovascular Report (Signed)
[place]. [facility]  Patient:    [name], [name]                         MRN: [id number] Proc. Date: 03/13/00 Adm. Date:  [id number] Attending:  Robynn Pane CC:         [name], M.D.  Cardiac Catheterization Lab   Cardiac Catheterization  PROCEDURE: 1. Left cardiac catheterization. 2. Selective left and right coronary angiography. 3. Left ventriculography via right groin using Judkins technique. 4. Successful percutaneous transluminal coronary angioplasty to distal    right coronary artery using 2.0 x 9 mm long Maverick balloon.  INDICATIONS FOR PROCEDURE:  [name] is a 77 year old black male with past medical history significant for hypertension, non-insulin-dependent diabetes mellitus, congestive heart failure, family history of coronary artery disease. He was recently discharged from the hospital status post appendectomy complicated by superficial wound abscess.  He came to the office complaining of exertional dyspnea associated with vague left arm numbness off and on.  He denies chest pain, nausea, vomiting, or diaphoresis.  He denies PND, orthopnea, or leg swelling.  He denies palpation, lightheadedness, or syncope. The patient underwent Persantine Cardiolite on March 02, 2000, which showed apical and distal anterior wall scarring with peri-infarction, ischemia, and distal anteroapical wall ischemia with ejection fraction of 45%.  Due to recent history of congestive heart failure, multiple risk factors, exertional dyspnea, probable anginal equivalent, and positive Persantine Cardiolite, the patient was advised for catheterization and possible angioplasty.  PAST MEDICAL HISTORY:  As above.  PAST SURGICAL HISTORY:  He had appendectomy in January 2002.  He had fatty tumor resection from left side of the chest six or seven years ago.  MEDICATIONS: 1. Altace 10 mg p.o. q.d. 2. Lasix 40 mg p.o. q.d. 3. Nitro-Dur 0.4 mg per hour  q.d. 4. Baby aspirin 81 mg 2 tablets daily. 5. Amaryl 4 mg p.o. q.d. 6. Nitrostat sublingual p.r.n.  SOCIAL HISTORY:  He is married and has seven children.  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  No history of smoking or alcohol abuse.  He was born in [place], lives in [place].  FAMILY HISTORY:  Father died of congestive heart failure.  He was diabetic and subsequently had gangrene of the legs.  Mother died at the age of 109.  She also had congestive heart failure, and she was diabetic.  PHYSICAL EXAMINATION:  GENERAL:  Alert, awake, oriented x 3, in no acute distress.  VITAL SIGNS:  Blood pressure 104/70, pulse 82 and regular.  HEENT:  Conjunctivae pink.  NECK:  Supple.  No JVD, no bruit.  LUNGS:  Clear to auscultation without rhonchi or rales.  CARDIOVASCULAR:  S1 and S2 were normal.  There was a soft systolic murmur at the apex.  ABDOMEN:  Soft.  Bowel sounds were present.  He had surgical dressing at the right lower quadrant.  EXTREMITIES:  There was no clubbing, cyanosis, or edema.  IMPRESSION:  Exertional dyspnea, probably anginal equivalent, left arm numbness, positive Persantine Cardiolite.  Rule out cardiac insufficiency, compensated congestive heart failure, hypertension, non-insulin-dependent diabetes mellitus.  Discussed with patient and his wife regarding Persantine Cardiolite results and various options of treatment; i.e., medical versus invasive.   Discussed risks; i.e., death, coma, stroke, need for emergency CABG, risk of restenosis, local vascular complications, etc., and they consented for the procedure.  PROCEDURE:  After obtaining informed consent, the patient was brought to the catheterization  lab and was placed on fluoroscopy table.  The right groin was prepped and draped in usual fashion.  Xylocaine 2% was used for local anesthesia in the right groin.  With the help of a thin-walled needle, a 6-French arterial sheath was placed.   The sheath was aspirated and flushed. Next, a 6-French left Judkins catheter was advanced over the wire under fluoroscopic guidance up to the ascending aorta.  The wire was pulled out. The catheter was aspirated and connected to the manifold.  The catheter was further advanced and engaged into the left coronary ostium.  Multiple views of the left system were taken.  Next, the catheter was disengaged and was pulled out over the wire and was replaced with 6-French right Judkins catheter which was advanced over the wire under fluoroscopic guidance up to the ascending aorta.  The wire was pulled out.  The catheter was aspirated and connected to the manifold.  The catheter was further advanced and engaged into the right coronary ostium.  Multiple views of the right system were taken.  Next the catheter was disengaged and was pulled out over the wire and was replaced with a 6-French pigtail catheter which was advanced over the wire under fluoroscopic guidance up to the ascending aorta.  The wire was pulled out, the catheter was aspirated and connected to the manifold.   The catheter was further advanced across the aortic valve into the LV.  LV pressures were recorded.  Next, the left ventriculography was done in the 30-degree RAO position.  Post-angiographic pressures are recorded from LV, and then pullback pressures were recorded from the aorta.  There was no gradient across the aortic valve.  Next, the pigtail catheter was pulled out over the wire.  The sheathes were aspirated and flushed.  ANGIOGRAPHY FINDINGS:  The patient has mild global hypokinesia.  EF appears to be improved from prior 2-D echocardiogram.  It is approximately 45 to 50%.  The left main was patent.  LAD has 20 to 25% distal diffuse disease.  Diagonal-1 is small which is patent.  Diagonal-2 has 15 to 20% proximal and mid stenosis.  Diagonal-3 is very small which is patent.   Ramus is small which bifurcates into two  branches which is diffusely diseased.  Left circumflex was patent which tapers down into A-V groove after giving rise to OM-1.  OM-1 is medium size which bifurcates into two branches proximally. Inferior branch has 20% ostial stenosis.  RCA has 20 to 30% ostial and mid stenosis and 95% distal stenosis.  PDA has 10 to 20% ostial stenosis.  INTERVENTIONAL PROCEDURE:  Successful PTCA to distal RCA was done using 2.0 x 9 mm long Maverick balloon.  Multiple inflations were done going up to 10 atmospheric pressure, achieving the diameter of 2.1.  A 2 mm lesion was dilated from 95% to less than 10% residual with excellent TIMI-3 distal flow without evidence of dissection, distal embolization, or thrombus.  The patient received weight-based heparin and ReoPro during the procedure.  The patient tolerated the procedure well.  There were no complications.  The patient was transferred to recovery room in stable condition. DD:  03/13/00 TD:  03/14/00 Job: [id number]

## 2010-06-03 NOTE — Consult Note (Signed)
[facility]  Patient:    [name], [name] Visit Number: [id number] MRN: [id number]          Service Type: FTC Location: FOOT Attending Physician:  Sharren Bridge Dictated by:   Jonelle Sports Cheryll Cockayne, M.D. Proc. Date: 05/23/01 Admit Date:  05/22/2001   CC:         Gita Kudo, M.D.  Eduardo Osier. Sharyn Lull, M.D.   Consultation Report  HISTORY OF PRESENT ILLNESS:  This is a 77 year old white male who was referred through the courtesy of Dr. Maryagnes Amos for assistance with the management of open ulceration of the right foot.  The patient has had type 2 diabetes for some seven years and denies any previous foot problems.  He does have obvious hypertrophic and mycotic nails and some callus formation but no history of previous ulceration.  Apparently some two weeks ago, the patient had placed a callus removal dressing (presumably salicylic acid) on a large callus of the plantar mid foot on the right.  And he wrapped this around the base of the fifth metatarsal because there was some pain in that area as well.  When he subsequently removed this, apparently a very large plug of tissue came with the dressing at the area of the fifth metatarsal base.  There was also some considerable denudement of the skin of the plantar midfoot.  These have remained unhealed and the ulcer itself has been extremely painful.  He was sent to Dr. Maryagnes Amos who did some debridement in that area and placed him on Vicodin.  He was referred here for further disposition.  Incidentally, Dr. Maryagnes Amos had x-rays of that foot made which showed some suspicion of osteomyelitis at the fifth metatarsal base.  PAST MEDICAL HISTORY: 1. History of heart failure. 2. Hypertension. 3. Coronary artery disease.  ALLERGIES:  No known medicinal allergies.  REGULAR MEDICATIONS:  Plavix, enteric-coated aspirin, Lasix, Altace, Nitro-Dur patch, Toprol XL, Amaryl, and Vicodin.  PHYSICAL EXAMINATION:  Limited  to the distal lower extremities.  DISTAL LOWER EXTREMITIES:  The left foot is free of edema and the right foot has mild generalized soft tissue swelling from the base of the toes back to the tarsal area.  Skin temperatures are elevated approximately three degrees in the right foot as opposed to the left foot.  These remain, however, within normal range.  Pulses in the feet are not palpable but are obtainable by Doppler.  Monofilament testing shows he lacks protected sensation throughout both feet.  The nails of both feet are extremely hypertrophic and mycotic and there is a huge, very hardened callus at the tip of the right hallux.  On the plantar aspect of the right foot in the mid foot area is an area of generally denuded skin, which extends from approximately the base of the second metatarsal laterally to wrap around the side of the foot and involve the area of the base of the fifth metatarsal as well.  At that area, there is a 25 x 15 mm open ulceration penetrating some 12 mm in depth likely to bone. There is some semi-purulent drainage from this area.  DISPOSITION: 1. The patient and his daughter are given general instruction regarding foot    care and diabetes by video with nurse and physician reinforcement. 2. The entire mid foot area where there is partially denuded skin as    previously indicated, is debrided further of some persistent callus and    some loose skin.  The margins of the penetrating  ulcer are likewise full    thickness debrided.  A culture is made from the depth of this ulcer and    sent to the laboratory. 3. The ulcer is packed with an iodoform wick and the area dressed with an    absorptive Allevyn pad and a bulky dressing. 4. The patient is to be placed on Keflex, generic, 1 gm twice daily and is    given 60 of these tablets at this point. 5. The patient is scheduled for arterial Dopplers and ankle brachial indices. 6. The patient is scheduled for a 3-phase  bone scan to further investigate the    possibility of osteomyelitis in the fifth metatarsal base. 7. The patient will return here in six days for further management. Dictated by:   Jonelle Sports Cheryll Cockayne, M.D. Attending Physician:  Sharren Bridge DD:  05/23/01 TD:  05/25/01 Job: [id number]

## 2010-06-03 NOTE — Discharge Summary (Signed)
NAMEMERWIN, BREDEN NO.:  [id number]   MEDICAL RECORD NO.:  [id number]          PATIENT TYPE:  INP   LOCATION:  2034                         FACILITY:  [facility]   PHYSICIAN:  Eduardo Osier. Sharyn Lull, M.D. DATE OF BIRTH:  27-Jan-1931   DATE OF ADMISSION:  01/31/2007  DATE OF DISCHARGE:  02/05/2007                               DISCHARGE SUMMARY   ADMITTING DIAGNOSES:  1. Cellulitis of left foot with diabetic foot ulcer, with second toe      gangrenous changes, rule out osteomyelitis.  2. Coronary artery disease.  3. Compensated congestive heart failure.  4. Uncontrolled hypertension.  5. Non-insulin diabetes mellitus.  6. Severe peripheral vascular disease.  7. History of right below-the-knee amputation in the past.   FINAL DIAGNOSES:  1. Status post cellulitis of left foot with dried gangrene of the      second toe.  2. Severe peripheral vascular disease.  3. Coronary artery disease.  4. Hypertension.  5. Diabetes mellitus.  6. Hypercholesteremia.   DISCHARGED HOME MEDICATIONS:  1. Coreg 25 mg 1 tablet every 12 hours.  2. Benazepril 20 mg 1 tablet twice daily.  3. Lipitor 80 mg 1 tablet daily.  4. Enteric-coated aspirin 81 mg 1 tablet daily.  5. Metformin 1000 mg 1 tablet twice daily.  6. Amaryl 4 mg 1 tablet twice daily.  7. Lopressor 120 mg 1 tablet daily.  8. Nitro-Dur 0.4 mg per hour applied to chest wall in a.m., off at      night.  9. Cipro 1 tablet twice daily for 10 more days.   DIET:  Low salt, low cholesterol, 1800-calories ADA diet.   ACTIVITY:  Walk with assistance as tolerated.  The patient has been  advised to monitor blood sugar and blood pressure daily.  Keep the left  foot to dry and clean.  The patient is scheduled for arteriogram of the  lower extremities on Monday, January 21, 2007, by Dr. Edilia Bo.  The bone  scan also will be scheduled as outpatient.   BRIEF HISTORY AND HOSPITAL COURSE:  [name] is a 77 year old black  male with  past medical history significant for coronary artery disease,  hypertension, non-insulin-dependent diabetes mellitus, PVD,  hypercholesteremia, history of congestive heart failure, status post  right below-knee amputation in May 2003.  He came to the ER complaining  of left foot pain associated with swelling and black discoloration of  second toe, states seeing his and was prescribed questionable antibiotic  but could not afford it.  Denies any history of fall or trauma, denies  fever, chills.  Denies chest pain, nausea, vomiting, diaphoresis.  Denies PND, orthopnea, leg swelling.  Denies palpitation,  lightheadedness or syncope.  States sinus pressure is running around 200  range.   PAST MEDICAL HISTORY:  As above.   PAST SURGICAL HISTORY:  She had a transmetatarsal amputation of the  right foot, subsequently required right BKA in May 2003.  He had  appendectomy in 2002, had fatty tumor resection from the left chest wall  in the past.   ALLERGIES:  NO KNOWN DRUG  ALLERGIES.   MEDICATION AT HOME:  1. He is on Coreg 25 mg p.o. every 12 hours.  2. Benazepril 20 mg p.o. b.i.d.  3. Lipitor 80 mg p.o. daily.  4. Enteric-coated aspirin 81 mg p.o. daily.  5. Metformin 1000 mg twice daily.  6. Amaryl 4 mg p.o. twice daily.  7. Omeprazole 20 mg p.o. daily.  8. Nitro-Dur patch 0.4 mg per hour daily.   SOCIAL HISTORY:  He is married, has seven children.  No history of  smoking or alcohol abuse.  He is retired, worked as a Arboriculturist in the  past.   FAMILY HISTORY:  Is positive for diabetes, hypertension, and gangrene.   PHYSICAL EXAMINATION:  GENERAL:  On examination, he is alert, awake,  oriented x3, in no acute distress.  VITAL SIGNS:  Blood pressure was 205/100, pulse was 71, temperature was  97.9.  HEENT:  Conjunctivae was pink.  NECK:  Supple, no JVD, no bruit.  LUNGS:  Clear to auscultation without rhonchi or rales.  CARDIOVASCULAR:  S1, S2, was normal.  There was soft systolic  murmur and  S4 gallop.  ABDOMEN:  Soft, bowel sounds were present, nontender.  EXTREMITIES:  There is no clubbing, cyanosis or edema.  On left foot,  the patient had swelling and erythema, and second toe showed gangrenous  changes with small non-healing ulcer.  Right leg:  He had right below-  knee amputation.   LABORATORY:  His labs:  Cholesterol was 150, HDL was low at 27, LDL 108,  triglyceride 76, hemoglobin A1c was very high at 12.3, hemoglobin was  12, hematocrit 37.2, white count of 6.4.  Sodium was 135, potassium 4.6,  chloride 97, bicarb 29, glucose 305, BUN 16, creatinine 0.97.  On  January 1, sodium was 138, potassium 3.9, chloride 105, bicarb 28,  glucose 150, BUN 15, creatinine 1.33.  X-ray of the left foot showed  degenerative changes, no evidence of visible osteomyelitis.  Chest x-ray  showed mass defect involving the trachea, suggesting enlargement of the  left side of the thyroid gland.  His EKG showed normal sinus rhythm with  occasional PVCs, right bundle branch block pattern with left anterior  fascicular block LVH.   BRIEF HOSPITAL COURSE:  The patient was admitted to telemetry unit, was  started on broad-spectrum antibiotics.  Cultures were obtained from the  foot.  CVTS vascular consult was obtained with Dr. Edilia Bo.  The  patient's redness and swelling was resolved with IV antibiotics and was  switched to p.o. Cipro.  Wound culture grew  gram-positive cocci, Serratia and group B strep, which was sensitive to  Cipro.  The patient was scheduled for outpatient arteriogram for lower  extremities and also for bone scan as outpatient and was discharged in  stable condition.      Eduardo Osier. Sharyn Lull, M.D.  Electronically Signed     MNH/MEDQ  D:  03/28/2007  T:  03/29/2007  Job:  [id number]   cc:   Eduardo Osier. Sharyn Lull, M.D.

## 2010-06-03 NOTE — Discharge Summary (Signed)
[place]. [facility]  Patient:    [name], [name]                         MRN: [id number] Adm. Date:  [id number] Disc. Date: 03/14/00 Attending:  Robynn Pane CC:         Mardene Celeste Lurene Shadow, M.D.   Discharge Summary  ADMITTING DIAGNOSES: 1. Exertional dyspnea, probably anginal left arm numbness with positive    Persantine Cardiolite, rule out pulmonary insufficiency. 2. History of heart failure. 3. Hypertension. 4. Non-insulin-dependent diabetes mellitus.  DISCHARGE DIAGNOSES: 1. Angina pectoris. 2. Coronary artery disease with positive Persantine Cardiolite, status post    percutaneous transluminal coronary angioplasty to distal right coronary    artery. 3. Congestive heart failure. 4. Hypertension. 5. Insulin-dependent diabetes mellitus.  DISCHARGE MEDICATIONS: 1. Plavix 75 mg one tablet daily with food. 2. Enteric coated aspirin 81 mg two tablets daily. 3. Lasix 40 mg one tablet daily. 4. Altace 10 mg one capsule daily. 5. Nitro-Dur 0.4 mg per hour, apply to chest while in the a.m. and off at    night. 6. Toprol XL 25 mg 1/2 tablet daily. 7. Amaryl 4 mg one tablet daily in the morning. 8. Nitrostat 0.4 mg sublingual use as directed.  ACTIVITY:  Avoid heavy lifting, pushing or pulling x 48 hours.  DIET:  Low salt, low cholesterol, 1800 calorie ADA diet.  SPECIAL INSTRUCTIONS:  Monitor blood sugars twice daily.  Status post angioplasty instructions have been given.  FOLLOWUP:  Follow up with me in 7-10 days.  Follow up with Dr. Leonie Man as scheduled.  The patient has been scheduled for phase 2 cardiac rehabilitation as outpatient.  CONDITION ON DISCHARGE:  Stable.  HISTORY OF PRESENT ILLNESS:  [name] is a 77 year old, African-American male with past medical history significant for hypertension, non-insulin-dependent diabetes mellitus, congestive heart failure with positive history of coronary artery disease who was  recently discharged from the hospital, status post appendectomy complicated by superficial wound abscess.  He came to the office complaining of exertional dyspnea associated with vague left arm numbness off and on.  He denies any chest pain, nausea, vomiting or diaphoresis.  He denies PND, orthopnea, leg swelling, palpitations, lightheadedness or syncope.  The patient underwent Persantine Cardiolite on March 02, 2000, which showed distal anterior scars with peri-infarct ischemia in distal anterior wall with ejection fraction of 45%. Due to recent congestive heart failure, multiple risk factors, exertional dyspnea, probable angina and positive Persantine Cardiolite, the patient was advised for possible angioplasty and stenting.  PAST MEDICAL HISTORY:  As above.  PAST SURGICAL HISTORY: 1. Appendectomy in January 2000. 2. Fatty tumor resection from the left side of the chest about six to seven    years ago.  MEDICATIONS: 1. Altace 10 mg p.o. q.d. 2. Lasix 40 mg p.o. q.d. 3. Nitro-Dur 0.4 mg per hour q.d. 4. Baby aspirin 81 mg two p.o. q.d. 5. Amaryl 4 mg p.o. q.d. 6. Nitrostat sublingual p.r.n.  SOCIAL HISTORY:  He is married with seven children.  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  There is no history of smoking or alcohol abuse.  He was born in [place] and lives in [place].  FAMILY HISTORY:  Father died of complications of diabetes.  He had gangrene of the leg.  He also had history of congestive heart failure.  Mother died of congestive heart failure at the age of 78.  She was diabetic.  PHYSICAL EXAMINATION:  GENERAL:  Alert, oriented x 3 in no acute distress.  VITAL SIGNS:  Blood pressure 104/70, pulse 82 and regular.  HEENT:  Conjunctivae was pink.  NECK:  Supple with no JVD or bruit.  LUNGS:  Clear to auscultation bilaterally without rhonchi or rales.  CARDIOVASCULAR:  S1, S2 normal.  There was soft systolic murmur.  ABDOMEN:  Soft, bowel  sounds present and nontender.  There was surgical dressing in the right lower quadrant.  EXTREMITIES:  No clubbing, cyanosis, or edema.  LABORATORY DATA AND X-RAY FINDINGS:  Sodium 140, potassium 5.0, BUN 23, creatinine 1.1, glucose 135.  Hemoglobin 10.8, hematocrit 34.7.  HOSPITAL COURSE:  The patient was an a.m. admission and underwent left cardiac catheterization, left coronary angiography, LV-graphy and PTCA to distal right coronary artery as per procedure report.  The patient tolerated procedure well.  There were no complications.  The patient was transferred to angioplasty unit in stable condition.  His sheaths were pulled out the same day.  The patient has been ambulating in the hallway without any problem. There is no evidence of groin hematoma.  Postprocedure, his CPKs are negative. The patient states he feels better and his breathing has improved after the procedure.  The patient did not have any episodes of chest pain or arm pain during the hospital stay.  The patient will be discharged on above medications and will be followed up in the office in 7-10 days and with Dr. Lurene Shadow as scheduled. DD:  03/14/00 TD:  03/14/00 Job: [id number]

## 2010-06-03 NOTE — Op Note (Signed)
[place]. [facility]  Patient:    [name], [name]                         MRN: [id number] Proc. Date: 01/31/00 Adm. Date:  [id number] Attending:  Doug Sou                           Operative Report  PREOPERATIVE DIAGNOSIS:  Acute appendicitis.  POSTOPERATIVE DIAGNOSIS:  Acute gangrenous appendicitis with rupture.  PROCEDURE:  Appendectomy.  SURGEON:  Mardene Celeste. Lurene Shadow, M.D.  ASSISTANT:  Nurse.  ANESTHESIA:  General.  INDICATIONS:  The patient is a 77 year old diabetic man presenting with a 2-day history of increasing abdominal pain without nausea or emesis.  He comes to the emergency room with leukocytosis of [address] and a temperature rising up to 102.  His CT scan of the abdomen showed what was consistent with an acute appendicitis.  He is brought to the operating room now for appendectomy.  PROCEDURE:  Following the induction of anesthesia, the patient positioned supinely, the abdomen was prepped and draped to be included in a sterile operative field.  A right lower quadrant incision was made and deepened through skin and subcutaneous tissues, dividing the flank muscles in the direction of their fibers down to the peritoneum.  The peritoneum was opened and entered.  Tethered in a retrocecal position was a very large gangrenous appendix, which was dissected free from the surrounding tissues down to the appendiceal base.  The base of the appendix was clamped and ligated with 2-0 silk sutures and the appendix was amputated and removed from the operative field and forwarded for pathologic evaluation.  During the course of the dissection of the appendix, the tip of the appendix became separated.  Both the entire appendix however was forwarded for pathologic evaluation.  The right lower quadrant was thoroughly irrigated with multiple aliquots of normal saline and sponge, instrument and sharp counts were then verified.  The peritoneum was closed with a  running suture of 0 chromic catgut.  Internal oblique and external oblique muscles closed with 0 chromic and subcutaneous tissues closed with a 2-0 Vicryl suture and the skin closed with staples. Sterile dressings applied, anesthetic reversed.  Patient removed from the operating room to the recovery room in stable condition having tolerated the procedure well. DD:  01/31/00 TD:  01/31/00 Job: [id number]

## 2010-06-03 NOTE — Discharge Summary (Signed)
[place]. [facility]  Patient:    KAYE, MITRO                         MRN: [id number] Adm. Date:  [id number] Disc. Date: 02/11/00 Attending:  Sonda Primes CC:         Eduardo Osier. Sharyn Lull, M.D.   Discharge Summary  ADMISSION DIAGNOSES: 1. Acute appendicitis. 2. Uncontrolled diabetes mellitus. 3. Hypertension. 4. Congestive heart failure.  DISCHARGE DIAGNOSES: 1. Acute ruptured ______ appendicitis. 2. Type 2 diabetes. 3. Congestive heart failure.  PROCEDURES IN HOSPITAL:  Exploratory laparotomy with appendectomy.  COMPLICATIONS:  Postoperative wound abscess.  CONSULTATIONS:  Cardiology, Dr. Eduardo Osier. Harwani.  CONDITION ON DISCHARGE:  Improved.  FOLLOWUP:  Two weeks with Dr. Lurene Shadow and in two weeks with Dr. Sharyn Lull.  HOSPITAL COURSE:  Patient is a 77 year old man who presented with several-day history of midabdominal pain with associated nausea and vomiting.  On admission, he was anorexic, he had a leukocytosis of 13,000 with a hemoglobin of 13.8, hematocrit of 43.3.  His glucose was elevated to 288, and his urinary glucose was greater than 1 g, protein was 13 mg%.  CT scan of the abdomen revealed inflammation in the right lower quadrant consistent with acute appendicitis with an obstructive fecalith.  Patient was taken to the operating room where he underwent exploration for removal of the gangrenous ruptured appendix.  Postoperative course was marked by some increased fever and wound pain and drainage.  Wound was opened and noted to have a wound abscess which was cultured and irrigated and packed.  Patient was continued on Tequin and Flagyl.  Cultures from the wound showed abundant E. coli.  His postoperative course continued to however to be benign.  I requested cardiology and medical consultation for treatment of his diabetes.  He was seen by Dr. Sharyn Lull who began him on Amaryl for his diabetes and on Altace, Lasix, Nitro-Dur  to optimally control his congestive heart failure.  At the time of discharge, he was tolerating a regular diet, his wound is now granulating satisfactorily. He is being discharged with followup in the office in two weeks.  MEDICATIONS: 1. Altace 10 mg p.o. q.d. 2. Lasix 40 mg p.o. q.d. 3. Nitro-Dur patch 0.4 mg to be applied to chest in the morning and remove at    night. 4. One baby aspirin 81 mg daily. 5. Amaryl 2 mg daily. 6. Tequin 400 mg daily. 7. Flagyl 500 mg three times a day for the next week.  ACTIVITY:  As tolerated.  DIET:  2000 calorie ADA. DD:  02/11/00 TD:  02/11/00 Job: [id number]

## 2010-06-03 NOTE — Op Note (Signed)
[place]. [facility]  Patient:    [name], [name] Visit Number: [id number] MRN: [id number]          Service Type: DSU Location: *N Attending Physician:  Bennye Alm Dictated by:   Di Kindle. Edilia Bo, M.D. Proc. Date: 06/10/01                             Operative Report  PREOPERATIVE DIAGNOSIS:  Gangrene of the right foot.  POSTOPERATIVE DIAGNOSIS:  Gangrene of the right foot.  OPERATION PERFORMED:  Right below-knee amputation.  SURGEON:  Di Kindle. Edilia Bo, M.D.  ASSISTANT:  Sherrie George, P.A.  ANESTHESIA:  General.  INDICATIONS FOR PROCEDURE:  The patient is a 77 year old gentleman who had presented with a rapidly progressing infection of his right foot.  He underwent essentially a guillotine amputation of the forefoot to control the infection and despite this, the wound did not show any significant progression and it was felt there was not enough remaining tissue to do a Symes amputation.  He was treated with intravenous antibiotics and then stabilized and a decision was made to proceed with right below-knee amputation.  DESCRIPTION OF PROCEDURE:  The patient was taken to the operating room received a general Anesthetic.  The right lower extremity was prepped and draped in the usual sterile fashion.  The circumference of the limb was measured 10 cm distal to the tibial tuberosity.  Two thirds of this distance was used to mark the anterior skin flap.  A long flap of equal length was then marked.  Tourniquet was inflated to 300 mmHg after the leg was exsanguinated. The incision was carried down to the skin and subcutaneous tissue, muscle and fascia to the tibia and fibula which were dissected free circumferentially. Next, the periosteum was elevated and the bone divided proximal to the level of skin division.  Both the tibia and fibula were divided.  The arteries and veins were individually suture ligated with 2-0 silk  ties.  The tourniquet was then released.  Hemostasis was then obtained using the electrocautery.  The edges of the bone were rasped.  The wound was irrigated with copious amounts of antibiotic solution.  Hemostasis was obtained and then the fascia layer was closed with interrupted 2-0 Vicryl.  The skin was closed with staples.  A sterile dressing was applied.  The patient tolerated the procedure well and was transferred to the recovery room in satisfactory condition.  The sponge and needle counts were correct. Dressing was applied.  The patient tolerated the procedure well and was transferred to the recovery room in satisfactory condition.  All needle and sponge counts were correct. Dictated by:   Di Kindle Edilia Bo, M.D. Attending Physician:  Bennye Alm DD:  06/10/01 TD:  06/11/01 Job: [id number]

## 2010-06-03 NOTE — Discharge Summary (Signed)
[name], [name] [name] NO.:  [id number]   MEDICAL RECORD NO.:  [id number]                   PATIENT TYPE:  IPS   LOCATION:  4148                                 FACILITY:  [facility]   PHYSICIAN:  Rande Brunt. Thomasena Edis, M.D.             DATE OF BIRTH:  12-Sep-1931   DATE OF ADMISSION:  06/13/2001  DATE OF DISCHARGE:  06/21/2001                                 DISCHARGE SUMMARY   DISCHARGE DIAGNOSES:  1. Right below-knee amputation secondary to diabetic peripheral vascular     disease.  2. History of hypertension.  3. History of congestive heart failure.  4. History of cardiovascular disease.  5. History of diabetes mellitus.  6. History of elevated cholesterol.  7. Clostridium difficile.   HISTORY OF PRESENT ILLNESS:  The patient is a 77 year old male with a past  medical history diabetes mellitus, hypertension, CHF, and CAD, admitted on  Jun 05, 2001, to evaluate ischemic foot and plantar wound.  The patient  underwent open transmetatarsal amputation of the right foot secondary to  gangrene by Di Kindle. Edilia Bo, M.D., on Jun 06, 2001.  Functionally due  to poor healing and elevated temperature, the patient underwent a right BKA  on Jun 10, 2001, by Dr. Edilia Bo.  PT reports at this time that the patient  is transferring sit to stand with minimal and moderate assistance,  __________  with standard walker.  He is nonweightbearing on the right.  The  patient was transferred to [facility] on Jun 12, 2001.   PAST MEDICAL HISTORY:  As above plus elevated cholesterol.   PAST SURGICAL HISTORY:  Significant for PTCA and appendectomy.   PRIMARY CARE [name] [name]:  Robynn Pane, M.D.   SOCIAL HISTORY:  The patient is married and lives with his wife in a one-  level home in [place], [place], with five steps to entry.  He is a  Arboriculturist at [employer].  Denies any tobacco or alcohol  use.  Seven children.  He was independent prior to admission.   REVIEW OF SYSTEMS:  Significant for chest pain, shortness fo breath, nausea,  and vomiting.   FAMILY HISTORY:  Noncontributory.   HOSPITAL COURSE:  The patient was admitted to the [facility] on Jun 13, 2001, for inpatient  rehabilitation where he received one to three hours of PT and OT daily.  He  made great progress during his eight-day stay in rehabilitation.  At the  time of discharge, he reached his goal of modified independence.  His  hospital course was significant for peripheral neuropathy or phantom pain  and loose stools.  The patient remained on Altace 10 mg p.o. q.d. for blood  pressure.  The blood pressure remained fairly stable throughout  rehabilitation.  No adjustments in medications were necessary.  He remained  on  Lasix 40 mg p.o. q.d. for CHF history.  On June 16, 2001, he was started  on Neurontin 100 mg at 8 a.m. and 1400 hours for phantom pain.  After the  addition of this medication, the phantom pain did improve.  On June 20, 2001,  it was noted that the patient began to develop some loose stool.  He  remained afebrile throughout his stay on rehabilitation.  The patient  received Kaopectate as needed for loose stools and Clostridium difficile  cultures were performed prior to discharge.  The Clostridium difficile  cultures came back positive.  The patient remained on Zocor 20 mg p.o.  q.h.s.  The patient had stable control while he was in rehabilitation.  He  was on Amaryl 4 mg p.o. q.d.  Urine cultures were performed on June 17, 2001,  which demonstrated 25,000 colonies.  He received Diflucan 150 mg p.o. x 1  dose prior to discharge.  There were no other complications while the  patient was in rehabilitation.  The latest blood count indicated that the  hemoglobin was 10.6, hematocrit 31.5, white blood cell count 8.6, and  platelet count 456.  Occult blood  was positive.  The sodium was 135,  potassium 4.4, glucose 109, BUN 18, and creatinine 1.1.  The AST was 35, ALT  57.  The urine culture performed on June 20, 2001, showed greater than 25,000  colonies.  Clostridium difficile was performed right before discharge on  June 21, 2001, which demonstrated positive Clostridium difficile toxin.  At  the time of discharge, the patient's temperature was 98.1 degrees, blood  pressure 130/84, respiratory rate 20, and pulse 96.  CBGs were running 91,  82, and 74.  The PT report at that time indicated that the patient was  ambulating with supervision approximately 60 feet with standard walker.  He  could transfer sit to stand with supervision.  Bed mobility with attendance.  He could perform most ADLs with supervision to independent level.   DISPOSITION:  The patient was discharge home with his family.   DISCHARGE MEDICATIONS:  1. Amaryl 4 mg p.o. q.d.  2. Aspirin 81 mg p.o. q.d.  3. Lasix 40 mg p.o. q.d.  4. Altace 10 mg p.o. q.d.  5. Lipitor 10 mg p.o. q.d.  6. Toprol XL 25 mg p.o. q.d.  7. ? Flagyl.   ACTIVITY:  The patient uses a walker.   DIET:  Diabetic diet.   SPECIAL INSTRUCTIONS:  Check sugars at least twice daily and record results.   FOLLOW-UP:  He is to follow up with Di Kindle. Edilia Bo, M.D., within  three to four weeks.  Follow up with Reuel Boom L. Thomasena Edis, M.D., in six to  eight weeks in the prosthetic clinic.     LOYCE HARRELL                             Daniel L. Thomasena Edis, M.D.    LH/MEDQ  D:  08/08/2001  T:  08/17/2001  Job:  [id number]   cc:   Di Kindle. Edilia Bo, M.D.   Robynn Pane, M.D.

## 2010-06-03 NOTE — Discharge Summary (Signed)
[place]. [facility]  Patient:    [name], [name] Visit Number: [id number] MRN: [id number]          Service Type: [facility] Location: [id number] Attending Physician:  Herold Harms Dictated by:   Sherrie George, P.A. Admit Date:  06/13/2001 Discharge Date: 06/21/2001   CC:         Rehab Service  [employer]. Sharyn Lull, M.D.  Sherri Rad, M.D.   Discharge Summary  DATE OF BIRTH:  1931-08-16  ADMISSION DIAGNOSES: 1. Gangrene of the right lower extremity. 2. Adult onset diabetes mellitus non-insulin dependent type 2. 3. Hypertension. 4. History of congestive heart failure.  DISCHARGE DIAGNOSES: 1. Gangrene of the right lower extremity. 2. Adult onset diabetes mellitus non-insulin dependent type 2. 3. Hypertension. 4. History of congestive heart failure.  PROCEDURES: 1. Open transmetatarsal amputation of the right foot on Jun 09, 2001. 2. Right below knee amputation Jun 10, 2001.  BRIEF HISTORY:  The patient is a 77 year old gentleman who presented with progressive ischemia of his right foot and extensive gangrene of the foot.  He was seen initially by Dr. Sherri Rad.  He was scheduled to perform surgery on him but cancelled his surgery and came to our office for further evaluation.  The patient notes that the wound began as a callus at the plantar aspect of his right foot.  He does not remember any specific injury to the foot.  Denies any history of claudication or rest pain prior to this problem with his foot.  He has had diabetes for a number of years and a history of hypertension and hypercholesterolemia.  He denies any prior MI but has a history of congestive heart failure.  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  He does not use alcohol on a regular basis.  MEDICATIONS ON ADMISSION: 1. Amaryl 4 mg p.o. q.d. 2. Altace 10 mg p.o. q.d. 3. Lasix 40 mg q.d. 4. Lipitor 20 mg q.d. 5. Aspirin 81 mg q.d.  ALLERGIES:  None  known.  For further HISTORY and PHYSICAL:  See the dictated note.  HOSPITAL COURSE:  The patient was admitted with hopes of placing him on antibiotics and do a transmetatarsal amputation in hopes of salvaging some of his foot.  He was taken to the operating room on Jun 06, 2001 and underwent a right open transmetatarsal amputation.  He tolerated this well.  The first postoperative day, he was hemodynamically stable.  Labs were quite stable. His dressing was changed on a daily basis.  On Jun 08, 2001 Dr. Edilia Bo examined the wound and it was his opinion that there was not enough viable tissue for a left side amputation.  He continued the antibiotics and the dressing changes and scheduled him for surgery the first available spot on Monday.  He remained stable throughout the weekend and then was taken to the operating room again on Jun 10, 2001.  He tolerated the procedure well.  He was eventually transferred to the floor and started on PT and OT.  He was evaluated by the rehab service and they were in agreement with plans to transfer him there for inpatient care.  He was subsequently discharged from the acute side on Jun 13, 2001 to the rehab unit on the 4th floor.  DISCHARGE MEDICATIONS: Tylox one to two p.o. q.4h. p.r.n. and his previous medicines as listed above.  DISCHARGE ACTIVITY:  Light to moderate.  He will undergo inpatient PT and  OT.  CONDITION ON DISCHARGE:  Improving. Dictated by:   Sherrie George, P.A. Attending Physician:  Herold Harms DD:  06/25/01 TD:  06/27/01 Job: [id number]

## 2010-09-05 ENCOUNTER — Encounter: Payer: Self-pay | Admitting: Internal Medicine

## 2010-09-07 ENCOUNTER — Encounter: Payer: Self-pay | Admitting: Vascular Surgery

## 2010-09-09 ENCOUNTER — Other Ambulatory Visit (HOSPITAL_COMMUNITY): Payer: Self-pay | Admitting: Cardiology

## 2010-09-12 ENCOUNTER — Encounter (HOSPITAL_COMMUNITY)
Admission: RE | Admit: 2010-09-12 | Discharge: 2010-09-12 | Disposition: A | Discharge status: Home / Self Care | Payer: Medicare Other | Source: Ambulatory Visit | Attending: Cardiology | Admitting: Cardiology

## 2010-09-12 ENCOUNTER — Ambulatory Visit (HOSPITAL_COMMUNITY)
Admission: RE | Admit: 2010-09-12 | Discharge: 2010-09-12 | Disposition: A | Discharge status: Home / Self Care | Payer: Medicare Other | Source: Ambulatory Visit | Attending: Cardiology | Admitting: Cardiology

## 2010-09-12 DIAGNOSIS — R9431 Abnormal electrocardiogram [ECG] [EKG]: Secondary | ICD-10-CM | POA: Insufficient documentation

## 2010-09-12 DIAGNOSIS — E119 Type 2 diabetes mellitus without complications: Secondary | ICD-10-CM | POA: Insufficient documentation

## 2010-09-12 DIAGNOSIS — I10 Essential (primary) hypertension: Secondary | ICD-10-CM | POA: Insufficient documentation

## 2010-09-12 DIAGNOSIS — R9439 Abnormal result of other cardiovascular function study: Secondary | ICD-10-CM | POA: Insufficient documentation

## 2010-09-12 DIAGNOSIS — Z950 Presence of cardiac pacemaker: Secondary | ICD-10-CM | POA: Insufficient documentation

## 2010-09-12 DIAGNOSIS — R42 Dizziness and giddiness: Secondary | ICD-10-CM | POA: Insufficient documentation

## 2010-09-12 MED ORDER — TECHNETIUM TC 99M TETROFOSMIN IV KIT
30.0000 | PACK | Freq: Once | INTRAVENOUS | Status: AC | PRN
  Administered 2010-09-12: 30 via INTRAVENOUS

## 2010-09-12 MED ORDER — TECHNETIUM TC 99M TETROFOSMIN IV KIT
10.0000 | PACK | Freq: Once | INTRAVENOUS | Status: AC | PRN
  Administered 2010-09-12: 10 via INTRAVENOUS

## 2010-09-17 HISTORY — PX: PACEMAKER INSERTION: SHX728

## 2010-09-26 ENCOUNTER — Encounter: Payer: Self-pay | Admitting: Internal Medicine

## 2010-09-26 ENCOUNTER — Ambulatory Visit (INDEPENDENT_AMBULATORY_CARE_PROVIDER_SITE_OTHER): Payer: Medicare Other | Admitting: Internal Medicine

## 2010-09-26 DIAGNOSIS — Z950 Presence of cardiac pacemaker: Secondary | ICD-10-CM

## 2010-09-26 DIAGNOSIS — I10 Essential (primary) hypertension: Secondary | ICD-10-CM

## 2010-09-26 DIAGNOSIS — I442 Atrioventricular block, complete: Secondary | ICD-10-CM

## 2010-09-26 LAB — PACEMAKER DEVICE OBSERVATION
ATRIAL PACING PM: 5
BAMS-0001: 150 {beats}/min
BATTERY VOLTAGE: 2.79 V
VENTRICULAR PACING PM: 100

## 2010-09-26 NOTE — Assessment & Plan Note (Signed)
Normal pacemaker function See Pace Art report No changes today  

## 2010-09-26 NOTE — Patient Instructions (Signed)
Your physician wants you to follow-up in: 6 months in the device clinic and 12 months with Dr Johney Frame Bonita Quin will receive a reminder letter in the mail two months in advance. If you don't receive a letter, please call our office to schedule the follow-up appointment.  Please have the pharmacy call Dr Annitta Jersey office to get refills

## 2010-09-26 NOTE — Assessment & Plan Note (Signed)
Stable No change required today  

## 2010-09-26 NOTE — Progress Notes (Signed)
The patient presents today for routine electrophysiology followup.  Since last being seen in our clinic, the patient reports doing very well.  Today, he denies symptoms of palpitations, chest pain, shortness of breath, orthopnea, PND, lower extremity edema, dizziness, presyncope, syncope, or neurologic sequela.  The patient feels that he is tolerating medications without difficulties and is otherwise without complaint today.   Past Medical History  Diagnosis Date  . Complete heart block     s/p PPM by JA 09/2009  . Hypercholesteremia   . HTN (hypertension)   . CAD (coronary artery disease)     s/p PTCA to PLV branch in 2002  . Non-insulin dependent diabetes mellitus   . PVD (peripheral vascular disease)     s/p right BKA  . CVA (cerebral infarction)     s/p PTA to middle cerebral artery   Past Surgical History  Procedure Date  . Pacemaker insertion     Current Outpatient Prescriptions  Medication Sig Dispense Refill  . aspirin EC 325 MG tablet Take 325 mg by mouth daily.        . benazepril (LOTENSIN) 20 MG tablet Take 20 mg by mouth daily.        . carvedilol (COREG) 25 MG tablet Take 25 mg by mouth 2 (two) times daily with a meal.        . glimepiride (AMARYL) 4 MG tablet Take 4 mg by mouth 2 (two) times daily.        . isosorbide mononitrate (IMDUR) 60 MG 24 hr tablet Take 60 mg by mouth daily.        . pantoprazole (PROTONIX) 40 MG tablet Take 40 mg by mouth daily.        . simvastatin (ZOCOR) 40 MG tablet Take 40 mg by mouth at bedtime.          No Known Allergies  History   Social History  . Marital Status: Married    Spouse Name: N/A    Number of Children: N/A  . Years of Education: N/A   Occupational History  . Not on file.   Social History Main Topics  . Smoking status: Never Smoker   . Smokeless tobacco: Not on file  . Alcohol Use: No  . Drug Use: Not on file  . Sexually Active: Not on file   Other Topics Concern  . Not on file   Social History  Narrative  . No narrative on file    Family History  Problem Relation Age of Onset  . Heart failure Mother   . Heart failure Father   . Prostate cancer Brother     Physical Exam: Filed Vitals:   09/26/10 1146  BP: 130/82  Pulse: 80  Height: 5\' 4"  (1.626 m)  Weight: 189 lb 12.8 oz (86.093 kg)    GEN- The patient is well appearing, alert and oriented x 3 today.   Head- normocephalic, atraumatic Eyes-  Sclera clear, conjunctiva pink Ears- hearing intact Oropharynx- clear Neck- supple, no JVP Lymph- no cervical lymphadenopathy Lungs- Clear to ausculation bilaterally, normal work of breathing Chest- pacemaker pocket is well healed Heart- Regular rate and rhythm, no murmurs, rubs or gallops, PMI not laterally displaced GI- soft, NT, ND, + BS Extremities- no clubbing, cyanosis, or edema MS- s/p R BKA Skin- no rash or lesion Psych- euthymic mood, full affect Neuro- strength and sensation are intact  Pacemaker interrogation- reviewed in detail today,  See PACEART report  Assessment and Plan:

## 2010-10-05 LAB — CBC
HCT: 32.5 — ABNORMAL LOW
HCT: 35.4 — ABNORMAL LOW
Hemoglobin: 10.8 — ABNORMAL LOW
Platelets: 235
Platelets: 271
RBC: 3.87 — ABNORMAL LOW
WBC: 6.9
WBC: 6.9

## 2010-10-05 LAB — URINE MICROSCOPIC-ADD ON

## 2010-10-05 LAB — BLOOD GAS, ARTERIAL
Bicarbonate: 23.9
TCO2: 25.1
pCO2 arterial: 39.3
pH, Arterial: 7.401
pO2, Arterial: 95

## 2010-10-05 LAB — BASIC METABOLIC PANEL
GFR calc Af Amer: >60
GFR calc non Af Amer: 53 — ABNORMAL LOW
Potassium: 3.9
Sodium: 138

## 2010-10-05 LAB — URINALYSIS, ROUTINE W REFLEX MICROSCOPIC
Leukocytes, UA: NEGATIVE
Nitrite: NEGATIVE
Specific Gravity, Urine: 1.02
pH: 5.5

## 2010-10-05 LAB — COMPREHENSIVE METABOLIC PANEL
AST: 37
Albumin: 3.3 — ABNORMAL LOW
Alkaline Phosphatase: 144 — ABNORMAL HIGH
BUN: 10
Chloride: 108
GFR calc Af Amer: >60
Potassium: 4.3
Total Bilirubin: 0.5

## 2010-10-05 LAB — TYPE AND SCREEN

## 2010-10-06 LAB — CBC
Hemoglobin: 10.4 — ABNORMAL LOW
RBC: 3.78 — ABNORMAL LOW
RDW: 14.6

## 2010-10-06 LAB — HEMOGLOBIN A1C: Hgb A1c MFr Bld: 12 — ABNORMAL HIGH

## 2010-10-06 LAB — BASIC METABOLIC PANEL
Calcium: 8.8
GFR calc Af Amer: >60
GFR calc non Af Amer: >60
Glucose, Bld: 250 — ABNORMAL HIGH
Sodium: 137

## 2010-10-06 LAB — POCT I-STAT GLUCOSE: Glucose, Bld: 163 — ABNORMAL HIGH

## 2010-10-11 ENCOUNTER — Encounter: Payer: Self-pay | Admitting: Vascular Surgery

## 2010-10-12 ENCOUNTER — Ambulatory Visit: Payer: Self-pay | Admitting: Vascular Surgery

## 2010-10-14 LAB — COMPREHENSIVE METABOLIC PANEL
ALT: 17
AST: 18
Albumin: 3 — ABNORMAL LOW
Alkaline Phosphatase: 98
Calcium: 9.4
GFR calc Af Amer: >60
Glucose, Bld: 146 — ABNORMAL HIGH
Potassium: 4.4
Sodium: 138
Total Protein: 6.5

## 2010-10-14 LAB — URINALYSIS, ROUTINE W REFLEX MICROSCOPIC
Glucose, UA: 100 — AB
Leukocytes, UA: NEGATIVE
Nitrite: NEGATIVE
Specific Gravity, Urine: 1.022
pH: 5.5

## 2010-10-14 LAB — CBC
HCT: 34.5 — ABNORMAL LOW
Hemoglobin: 11 — ABNORMAL LOW
Hemoglobin: 11.3 — ABNORMAL LOW
Hemoglobin: 12.1 — ABNORMAL LOW
MCHC: 33
MCV: 85.8
Platelets: 214
RBC: 4.02 — ABNORMAL LOW
RBC: 4.27
RDW: 16.3 — ABNORMAL HIGH
WBC: 6.3
WBC: 6.9

## 2010-10-14 LAB — BASIC METABOLIC PANEL
BUN: 21
CO2: 28
Chloride: 104
Chloride: 106
Creatinine, Ser: 1.33
GFR calc Af Amer: >60
GFR calc non Af Amer: 53 — ABNORMAL LOW
Potassium: 4.2
Potassium: 4.8
Sodium: 139

## 2010-10-14 LAB — LIPID PANEL
HDL: 42
LDL Cholesterol: 109 — ABNORMAL HIGH
Total CHOL/HDL Ratio: 3.8
VLDL: 10

## 2010-10-14 LAB — TSH: TSH: 1.421

## 2010-10-14 LAB — POCT I-STAT, CHEM 8
BUN: 22
Chloride: 107
HCT: 38 — ABNORMAL LOW
Potassium: 4.6
Sodium: 140

## 2010-10-14 LAB — URINE MICROSCOPIC-ADD ON

## 2010-10-14 LAB — DIFFERENTIAL
Basophils Relative: 1
Lymphs Abs: 1.6
Monocytes Absolute: 0.4
Monocytes Relative: 7
Neutro Abs: 4.7

## 2010-10-14 LAB — HOMOCYSTEINE: Homocysteine: 13.9

## 2010-10-14 LAB — C-REACTIVE PROTEIN: CRP: 0.5 — ABNORMAL LOW (ref <0.6)

## 2010-10-14 LAB — APTT: aPTT: 24

## 2010-10-17 LAB — CBC
HCT: 32.4 — ABNORMAL LOW
HCT: 31 — ABNORMAL LOW
Hemoglobin: 10.6 — ABNORMAL LOW
Hemoglobin: 13.4
Hemoglobin: 10.1 — ABNORMAL LOW
MCHC: 32.3
MCV: 86.8
MCV: 86.9
MCV: 86.8
RBC: 3.73 — ABNORMAL LOW
RBC: 4.78
RBC: 3.57 — ABNORMAL LOW
RDW: 16.2 — ABNORMAL HIGH
WBC: 6.5
WBC: 6.6

## 2010-10-17 LAB — GLUCOSE, CAPILLARY
Glucose-Capillary: 162 — ABNORMAL HIGH
Glucose-Capillary: 163 — ABNORMAL HIGH

## 2010-10-17 LAB — BASIC METABOLIC PANEL
CO2: 28
Calcium: 10.8 — ABNORMAL HIGH
Calcium: 8.7
Chloride: 103
Creatinine, Ser: 1.02
Creatinine, Ser: 1.18
GFR calc Af Amer: >60
GFR calc non Af Amer: >60
Glucose, Bld: 100 — ABNORMAL HIGH
Potassium: 4.2
Sodium: 138

## 2010-10-17 LAB — DIFFERENTIAL
Basophils Absolute: 0
Basophils Relative: 0
Eosinophils Absolute: 0.2
Monocytes Absolute: 0.4
Monocytes Relative: 7

## 2010-10-17 LAB — HEPARIN LEVEL (UNFRACTIONATED): Heparin Unfractionated: <0.1 — ABNORMAL LOW

## 2010-10-21 LAB — BASIC METABOLIC PANEL
CO2: 25
Chloride: 103
Creatinine, Ser: 1.09
GFR calc Af Amer: >60
Potassium: 4.3

## 2010-10-21 LAB — COMPREHENSIVE METABOLIC PANEL
ALT: 20
AST: 16
Calcium: 9.6
Creatinine, Ser: 0.97
GFR calc Af Amer: >60
Sodium: 135
Total Protein: 7.4

## 2010-10-21 LAB — WOUND CULTURE

## 2010-10-21 LAB — CBC
HCT: 32.3 — ABNORMAL LOW
Hemoglobin: 10.6 — ABNORMAL LOW
MCHC: 32.6
MCHC: 32.8
MCV: 83.9
RBC: 3.86 — ABNORMAL LOW
RDW: 14.3
WBC: 6

## 2010-10-21 LAB — LIPID PANEL
Cholesterol: 150
HDL: 27 — ABNORMAL LOW
Total CHOL/HDL Ratio: 5.6
VLDL: 15

## 2010-10-21 LAB — PROTIME-INR
INR: 0.9
Prothrombin Time: 12.8

## 2010-10-21 LAB — APTT: aPTT: 26

## 2010-10-21 LAB — HEMOGLOBIN A1C: Hgb A1c MFr Bld: 12.3 — ABNORMAL HIGH

## 2011-05-18 ENCOUNTER — Encounter (HOSPITAL_COMMUNITY): Payer: Self-pay | Admitting: Emergency Medicine

## 2011-05-18 ENCOUNTER — Emergency Department (HOSPITAL_COMMUNITY): Payer: Medicare Other

## 2011-05-18 ENCOUNTER — Emergency Department (HOSPITAL_COMMUNITY)
Admission: EM | Admit: 2011-05-18 | Discharge: 2011-05-18 | Disposition: A | Discharge status: Home / Self Care | Payer: Medicare Other | Attending: Emergency Medicine | Admitting: Emergency Medicine

## 2011-05-18 DIAGNOSIS — L97509 Non-pressure chronic ulcer of other part of unspecified foot with unspecified severity: Secondary | ICD-10-CM | POA: Insufficient documentation

## 2011-05-18 DIAGNOSIS — M79609 Pain in unspecified limb: Secondary | ICD-10-CM | POA: Insufficient documentation

## 2011-05-18 DIAGNOSIS — S98139A Complete traumatic amputation of one unspecified lesser toe, initial encounter: Secondary | ICD-10-CM | POA: Insufficient documentation

## 2011-05-18 DIAGNOSIS — I10 Essential (primary) hypertension: Secondary | ICD-10-CM | POA: Insufficient documentation

## 2011-05-18 DIAGNOSIS — E119 Type 2 diabetes mellitus without complications: Secondary | ICD-10-CM | POA: Insufficient documentation

## 2011-05-18 DIAGNOSIS — Z79899 Other long term (current) drug therapy: Secondary | ICD-10-CM | POA: Insufficient documentation

## 2011-05-18 DIAGNOSIS — R0989 Other specified symptoms and signs involving the circulatory and respiratory systems: Secondary | ICD-10-CM

## 2011-05-18 DIAGNOSIS — I2510 Atherosclerotic heart disease of native coronary artery without angina pectoris: Secondary | ICD-10-CM | POA: Insufficient documentation

## 2011-05-18 DIAGNOSIS — M7989 Other specified soft tissue disorders: Secondary | ICD-10-CM

## 2011-05-18 DIAGNOSIS — I70269 Atherosclerosis of native arteries of extremities with gangrene, unspecified extremity: Secondary | ICD-10-CM

## 2011-05-18 DIAGNOSIS — M79673 Pain in unspecified foot: Secondary | ICD-10-CM

## 2011-05-18 DIAGNOSIS — I739 Peripheral vascular disease, unspecified: Secondary | ICD-10-CM

## 2011-05-18 DIAGNOSIS — I779 Disorder of arteries and arterioles, unspecified: Secondary | ICD-10-CM | POA: Insufficient documentation

## 2011-05-18 LAB — CBC
Platelets: 213 10*3/uL (ref 150–400)
RBC: 4.57 MIL/uL (ref 4.22–5.81)
RDW: 15.2 % (ref 11.5–15.5)
WBC: 5 10*3/uL (ref 4.0–10.5)

## 2011-05-18 LAB — DIFFERENTIAL
Basophils Absolute: 0 10*3/uL (ref 0.0–0.1)
Eosinophils Relative: 1 % (ref 0–5)
Lymphocytes Relative: 26 % (ref 12–46)
Lymphs Abs: 1.3 10*3/uL (ref 0.7–4.0)
Neutro Abs: 3.2 10*3/uL (ref 1.7–7.7)
Neutrophils Relative %: 65 % (ref 43–77)

## 2011-05-18 LAB — BASIC METABOLIC PANEL
CO2: 25 mEq/L (ref 19–32)
Calcium: 10.4 mg/dL (ref 8.4–10.5)
Glucose, Bld: 114 mg/dL — ABNORMAL HIGH (ref 70–99)
Potassium: 5.1 mEq/L (ref 3.5–5.1)
Sodium: 138 mEq/L (ref 135–145)

## 2011-05-18 LAB — GLUCOSE, CAPILLARY: Glucose-Capillary: 118 mg/dL — ABNORMAL HIGH (ref 70–99)

## 2011-05-18 NOTE — ED Provider Notes (Signed)
Medical screening examination/treatment/procedure(s) were performed by non-physician practitioner and as supervising physician I was immediately available for consultation/collaboration.   Carleene Cooper III, MD 05/18/11 2002

## 2011-05-18 NOTE — ED Provider Notes (Signed)
History     CSN: [id number]  Arrival date & time 05/18/11  1103   First MD Initiated Contact with Patient 05/18/11 1238      Chief Complaint  Patient presents with  . Foot Pain    (Consider location/radiation/quality/duration/timing/severity/associated sxs/prior treatment) Patient is a 78 y.o. male presenting with lower extremity pain. The history is provided by the patient.  Foot Pain This is a new problem. The current episode started 1 to 4 weeks ago. The problem occurs constantly. The problem has been gradually worsening. Pertinent negatives include no fever or numbness. Associated symptoms comments: Complains of ulcerations to toes for several days. [facility] and directed to come here for further evaluation. No fever. He denies significant pain. He has lost his right left below the knee to vascular disease.. The symptoms are aggravated by nothing. He has tried nothing for the symptoms.    Past Medical History  Diagnosis Date  . Complete heart block     s/p PPM by JA 09/2009  . Hypercholesteremia   . HTN (hypertension)   . CAD (coronary artery disease)     s/p PTCA to PLV branch in 2002  . Non-insulin dependent diabetes mellitus   . PVD (peripheral vascular disease)     s/p right BKA  . CVA (cerebral infarction)     s/p PTA to middle cerebral artery    Past Surgical History  Procedure Date  . Pacemaker insertion     Family History  Problem Relation Age of Onset  . Heart failure Mother   . Heart failure Father   . Prostate cancer Brother     History  Substance Use Topics  . Smoking status: Never Smoker   . Smokeless tobacco: Not on file  . Alcohol Use: No      Review of Systems  Constitutional: Negative for fever.  Musculoskeletal:       See HPI.  Neurological: Negative for numbness.       He denies chronic numbness in extremity secondary to DM.    Allergies  Review of patient's allergies indicates no known  allergies.  Home Medications   Current Outpatient Rx  Name Route Sig Dispense Refill  . ASPIRIN EC 325 MG PO TBEC Oral Take 325 mg by mouth daily.      . ATORVASTATIN CALCIUM 80 MG PO TABS Oral Take 80 mg by mouth daily.    Marland Kitchen BENAZEPRIL HCL 20 MG PO TABS Oral Take 20 mg by mouth daily.      Marland Kitchen CARVEDILOL 12.5 MG PO TABS Oral Take 12.5 mg by mouth 2 (two) times daily with a meal.    . CEPHALEXIN 500 MG PO CAPS Oral Take 500 mg by mouth 3 (three) times daily.    Marland Kitchen CLOPIDOGREL BISULFATE 75 MG PO TABS Oral Take 75 mg by mouth daily.    Marland Kitchen SPIRONOLACTONE 25 MG PO TABS Oral Take 12.5 mg by mouth daily.      BP 150/96  Pulse 75  Temp(Src) 98.6 F (37 C) (Oral)  Resp 20  SpO2 98%  Physical Exam  Constitutional: He is oriented to person, place, and time. He appears well-developed and well-nourished. No distress.  Musculoskeletal: He exhibits no tenderness.       Ulcerations to distal 3rd, 4th and 5th toes with eschar present to distal 3rd toe. 2nd toe surgically absent. Toe nail absent to Great toe with no discoloration, ulceration or drainage. Foot is swollen, hardened  to palpation. DP pulse present. Entire foot to ankle is discolored, but not cool to touch. Calf soft and nontender.  Neurological: He is alert and oriented to person, place, and time.    ED Course  Procedures (including critical care time)  Labs Reviewed  GLUCOSE, CAPILLARY - Abnormal; Notable for the following:    Glucose-Capillary 118 (*)    All other components within normal limits  CBC  DIFFERENTIAL  BASIC METABOLIC PANEL   Dg Foot Complete Left  05/18/2011  *RADIOLOGY REPORT*  Clinical Data: Foot pain  LEFT FOOT - COMPLETE 3+ VIEW  Comparison: None  Findings:  The patient is status post second ray amputation.  There is no acute fracture or subluxation identified.  There is no acute bony abnormality noted.  Vascular calcifications are noted within the hind foot region.  There is moderate midfoot degenerative change.   IMPRESSION:  1.  No acute bony abnormalities. 2.  Status post second ray amputation.  Original Report Authenticated By: Rosealee Albee, M.D.     No diagnosis found. 1. PVD 2. Foot ulcerations with eschar   MDM  Dr. Ignacia Palma in to co-evaluate patient and consults Dr. Edilia Bo. Venous duplex ordered by Dr. Edilia Bo and will advise as to whether or not admission is required.        Rodena Medin, PA-C 05/18/11 1551

## 2011-05-18 NOTE — ED Notes (Signed)
Pt back to room.

## 2011-05-18 NOTE — ED Notes (Signed)
Patient resting while waiting further testing. Patient resting with NAD at this time.

## 2011-05-18 NOTE — ED Notes (Signed)
Patient transported to Ultrasound 

## 2011-05-18 NOTE — ED Provider Notes (Signed)
Medical screening examination/treatment/procedure(s) were conducted as a shared visit with non-physician practitioner(s) and myself.  I personally evaluated the patient during the encounter Diabetic man with peripheral vascular disease with nonhealing ulcers on toes.  Has had prior R BKA.  Exam shows his left foot is indurated and hyperpigmented, with ulcerated tips of his toes.  This appears to be the result of ischemia to me.  Called Waverly Ferrari, M.D., vascular surgeon, to see pt.   Carleene Cooper III, MD 05/18/11 2002

## 2011-05-18 NOTE — ED Notes (Signed)
Patient states he was seen at his doctors office x 2 days ago for pain in his left foot and the bandages were placed on his left foot and toes at the doctors office. Patient states he does not know what the doctor did to his foot and the color is better/lighter than 2 days ago. Patient left foot feels cold to touch and appears back in color. Patient denies pain at this time and states he is here for his shots. Family at bedside.

## 2011-05-18 NOTE — ED Notes (Signed)
Patient resting with NAD at this time. Patient waiting for consulting physician.

## 2011-05-18 NOTE — Discharge Instructions (Signed)
Please read and follow all provided instructions.  Your diagnoses today include:  1. Foot pain   2. PAD (peripheral artery disease)   3. Non-healing ulcer of foot     Tests performed today include:  Ultrasounds to look at the blood flow in your leg and foot  Blood tests  Vital signs. See below for your results today.   Medications prescribed:   None  Home care instructions:  Follow any educational materials contained in this packet.  Keep foot elevated as much as possible above the level of your heart to help reduce swelling.   Follow-up instructions: As listed on front page of discharge instructions.  Return instructions:   Please return to the Emergency Department if you experience worsening symptoms.   Return with worsening pain, swelling, redness of any of the wounds on your foot.  Return if you notice pus draining from these wounds.  Return with fever  Please return if you have any other emergent concerns.  Additional Information:  Your vital signs today were: BP 159/117  Pulse 74  Temp(Src) 98.6 F (37 C) (Oral)  Resp 20  SpO2 100% If your blood pressure (BP) was elevated above 135/85 this visit, please have this repeated by your doctor within one month. -------------- No Primary Care Doctor Call [facility]  [phone] Other agencies that provide inexpensive medical care    [facility]  [phone]    [facility]  [phone]    [facility]  [phone]    [facility]  [phone]    [facility]  [phone]    [facility]  [phone] -------------- RESOURCE GUIDE:  Dental Problems  Patients with Medicaid: [facility] [phone] [address]                                            [phone] [address] Phone:  [phone]                                                   Phone:  [phone]  If unable to pay or uninsured, contact:  [facility] or  [facility]. to become qualified for the adult dental clinic.  Chronic Pain Problems Contact [facility]  [phone] Patients need to be referred by their primary care doctor.  Insufficient Money for Medicine Contact [employer]:  call "211" or [facility] [phone].  Psychological Services [facility]  [phone] [facility]  [phone] [facility]   [phone] (emergency services [phone])  Substance Abuse [facility]  [phone] [facility] [phone] [facility] [place] [phone] Floydene Flock [phone] Residential & Outpatient Substance Abuse Program  [phone]  Abuse/Neglect [facility] Child Abuse Hotline [phone] [facility] Child Abuse Hotline [phone] (After Hours)  Emergency Shelter [facility] [phone]  Maternity Homes [facility] at [facility] [phone] [facility] [phone]  [facility]  [facility] [address] [place]                       [address]      [address]  [place]                                                [place]                            [place] Phone:  [phone]                                   Phone:  [phone]                 Phone:  [phone]  [place] [facility] Phone:  [phone]  [place] Child Abuse Hotline [phone]) [phone] (After Hours)    

## 2011-05-18 NOTE — Consult Note (Addendum)
Vascular and Vein Specialist of [facility]  Patient name: [name] MRN: [id number] DOB: 10-04-1931 Sex: male  REASON FOR CONSULT: swelling left leg and wounds left foot.  HPI: [name] is a 78 y.o. male who has been followed by the wound care center in [place] with wounds on the tips of the 3rd, 4th, and 5th toes. He is a poor historian, but best I can tell, he was sent from the wound care center to the [facility] ED, because the toes were worse.  Pt is s/p right BKA and is ambulatory with a prosthesis. He has some mild left calf claudication. No rest pain in the left foot. He has had the wounds on the tips of his toes for several weeks.  He does not oh how he developed the wounds on the tips of his toes on the left foot. He also developed the fairly sudden onset of left leg swelling about 1 week ago. He is unaware of any previous history of DVT.  Of note he is status post a left femoral to above-knee popliteal artery bypass with PTFE graft on 01/31/2007. His right below-the-knee amputation was done in May of 2003. On his most recent follow up study which was in May of 2012, his left femoropopliteal bypass graft was patent and his ABI on the left was 84%. Of note, the patient's preoperative arteriogram back in 2009 showed that on the left side the common femoral and deep femoral arteries were patent, however the superficial femoral artery was occluded at its origin with reconstitution of the above-knee popliteal artery. The anterior tibial and posterior tibial arteries were occluded. The proximal peroneal artery had severe diffuse disease it was patent distally and reconstituted a short segment of the dorsalis pedis artery. Thus he had severe tibial artery occlusive disease at that time.   Past Medical History  Diagnosis Date  . Complete heart block     s/p PPM by JA 09/2009  . Hypercholesteremia   . HTN (hypertension)   . CAD (coronary artery disease)     s/p PTCA to PLV branch in 2002  .  Non-insulin dependent diabetes mellitus   . PVD (peripheral vascular disease)     s/p right BKA  . CVA (cerebral infarction)     s/p PTA to middle cerebral artery    Family History  Problem Relation Age of Onset  . Heart failure Mother   . Heart failure Father   . Prostate cancer Brother     SOCIAL HISTORY: Married with 7 children. History  Substance Use Topics  . Smoking status: Never Smoker   . Smokeless tobacco: Not on file  . Alcohol Use: No    No Known Allergies  No current facility-administered medications for this encounter.   Current Outpatient Prescriptions  Medication Sig Dispense Refill  . aspirin EC 325 MG tablet Take 325 mg by mouth daily.        Marland Kitchen atorvastatin (LIPITOR) 80 MG tablet Take 80 mg by mouth daily.      . benazepril (LOTENSIN) 20 MG tablet Take 20 mg by mouth daily.        . carvedilol (COREG) 12.5 MG tablet Take 12.5 mg by mouth 2 (two) times daily with a meal.      . cephALEXin (KEFLEX) 500 MG capsule Take 500 mg by mouth 3 (three) times daily.      . clopidogrel (PLAVIX) 75 MG tablet Take 75 mg by mouth daily.      Marland Kitchen spironolactone (  ALDACTONE) 25 MG tablet Take 12.5 mg by mouth daily.        REVIEW OF SYSTEMS: Arly.Keller ] denotes positive finding; [  ] denotes negative finding CARDIOVASCULAR:  [ ]  chest pain   [ ]  chest pressure   [ ]  palpitations   [ ]  orthopnea   Arly.Keller ] dyspnea on exertion   Arly.Keller ] claudication-left calf   [ ]  rest pain   [ ]  DVT   [ ]  phlebitis PULMONARY:   [ ]  productive cough   [ ]  asthma   Arly.Keller ] wheezing NEUROLOGIC:   [ ]  weakness  [ ]  paresthesias  [ ]  aphasia  [ ]  amaurosis  [ ]  dizziness HEMATOLOGIC:   [ ]  bleeding problems   [ ]  clotting disorders MUSCULOSKELETAL:  [ ]  joint pain   [ ]  joint swelling [ ]  leg swelling GASTROINTESTINAL: [ ]   blood in stool  [ ]   hematemesis GENITOURINARY:  [ ]   dysuria  [ ]   hematuria PSYCHIATRIC:  [ ]  history of major depression INTEGUMENTARY:  [ ]  rashes  [ ]  ulcers CONSTITUTIONAL:  [ ]  fever    [ ]  chills  PHYSICAL EXAM: Filed Vitals:   05/18/11 1109 05/18/11 1412  BP: 150/96   Pulse: 67 75  Temp: 97.8 F (36.6 C) 98.6 F (37 C)  TempSrc: Oral Oral  Resp: 18 20  SpO2: 96% 98%   There is no height or weight on file to calculate BMI. GENERAL: The patient is a well-nourished male, in no acute distress. The vital signs are documented above. CARDIOVASCULAR: There is a regular rate and rhythm without significant murmur appreciated. I do not detect carotid bruits. He has palpable femoral pulses. It is difficult to palpate his left popliteal pulse over the leg is very swelling. He has a fairly brisk dorsalis pedis, and posterior tibial signal with the Doppler. He has 3+ edema of the left calf. PULMONARY: There is good air exchange bilaterally without wheezing or rales. ABDOMEN: Soft and non-tender with normal pitched bowel sounds.  MUSCULOSKELETAL: Has a right below-the-knee amputation. He's had a previous left second toe amputation. NEUROLOGIC: No focal weakness or paresthesias are detected. SKIN: he has ulcerations on the tips of the third fourth and fifth toes of the left foot. He had a dressing on the left great toe although there is no specific here except at the toenail has been removed. PSYCHIATRIC: The patient has a normal affect.  MEDICAL ISSUES: 1. SWELLING OF LEFT LEG FOR 1 WEEK: I have ordered a venous duplex scan to rule out a DVT of the left lower extremity. If this is positive, then the patient will be admitted by the medical service for treatment. 2. NONHEALING WOUNDS OF THE LEFT 3RD, 4TH, AND 5TH TOES: The patient has fairly brisk Doppler signals in the left dorsalis pedis and posterior tibial positions. He has known severe tibial artery occlusive disease. I will have the vascular lab duplex his femoropopliteal bypass graft to determine if this is patent. If there is no evidence of DVT and the patient is discharged, I can follow the left foot as an outpatient and  consider arteriography if the wounds progress. However it is unlikely that there is anything further to offer from a revascularization standpoint given his known severe tibial artery occlusive disease back in 2009.  Dimetri Armitage S [facility]: [phone]

## 2011-05-18 NOTE — ED Notes (Signed)
Patient transported to Vascular Lab. ?

## 2011-05-18 NOTE — ED Provider Notes (Signed)
History     CSN: [id number]  Arrival date & time 05/18/11  1103   First MD Initiated Contact with Patient 05/18/11 1238      Chief Complaint  Patient presents with  . Foot Pain    (Consider location/radiation/quality/duration/timing/severity/associated sxs/prior treatment) HPI  Past Medical History  Diagnosis Date  . Complete heart block     s/p PPM by JA 09/2009  . Hypercholesteremia   . HTN (hypertension)   . CAD (coronary artery disease)     s/p PTCA to PLV branch in 2002  . Non-insulin dependent diabetes mellitus   . PVD (peripheral vascular disease)     s/p right BKA  . CVA (cerebral infarction)     s/p PTA to middle cerebral artery    Past Surgical History  Procedure Date  . Pacemaker insertion     Family History  Problem Relation Age of Onset  . Heart failure Mother   . Heart failure Father   . Prostate cancer Brother     History  Substance Use Topics  . Smoking status: Never Smoker   . Smokeless tobacco: Not on file  . Alcohol Use: No      Review of Systems  Allergies  Review of patient's allergies indicates no known allergies.  Home Medications   Current Outpatient Rx  Name Route Sig Dispense Refill  . ASPIRIN EC 325 MG PO TBEC Oral Take 325 mg by mouth daily.      . ATORVASTATIN CALCIUM 80 MG PO TABS Oral Take 80 mg by mouth daily.    Marland Kitchen BENAZEPRIL HCL 20 MG PO TABS Oral Take 20 mg by mouth daily.      Marland Kitchen CARVEDILOL 12.5 MG PO TABS Oral Take 12.5 mg by mouth 2 (two) times daily with a meal.    . CEPHALEXIN 500 MG PO CAPS Oral Take 500 mg by mouth 3 (three) times daily.    Marland Kitchen CLOPIDOGREL BISULFATE 75 MG PO TABS Oral Take 75 mg by mouth daily.    Marland Kitchen SPIRONOLACTONE 25 MG PO TABS Oral Take 12.5 mg by mouth daily.      BP 150/96  Pulse 75  Temp(Src) 98.6 F (37 C) (Oral)  Resp 20  SpO2 98%  Physical Exam  ED Course  Procedures (including critical care time)  Labs Reviewed  GLUCOSE, CAPILLARY - Abnormal; Notable for the following:    Glucose-Capillary 118 (*)    All other components within normal limits  CBC - Abnormal; Notable for the following:    Hemoglobin 12.4 (*)    HCT 37.1 (*)    All other components within normal limits  BASIC METABOLIC PANEL - Abnormal; Notable for the following:    Glucose, Bld 114 (*)    GFR calc non Af Amer 66 (*)    GFR calc Af Amer 76 (*)    All other components within normal limits  DIFFERENTIAL   Dg Foot Complete Left  05/18/2011  *RADIOLOGY REPORT*  Clinical Data: Foot pain  LEFT FOOT - COMPLETE 3+ VIEW  Comparison: None  Findings:  The patient is status post second ray amputation.  There is no acute fracture or subluxation identified.  There is no acute bony abnormality noted.  Vascular calcifications are noted within the hind foot region.  There is moderate midfoot degenerative change.  IMPRESSION:  1.  No acute bony abnormalities. 2.  Status post second ray amputation.  Original Report Authenticated By: Rosealee Albee, M.D.     1. Foot  pain   2. PAD (peripheral artery disease)   3. Non-healing ulcer of foot     3:29 PM Handoff from [employer]. Patient from Pod A, pending consult from vascular surgery who has seen. Vascular US is pending. Patient may need admission given findings. Dr. Sharyn Lull, pt's PCP knows patient is here. Will contact him if admission required. Will follow with VS.   Vital signs reviewed and are as follows: Filed Vitals:   05/18/11 1412  BP:   Pulse: 75  Temp: 98.6 F (37 C)  Resp: 20   4:35 PM Exam:  Gen NAD; Heart RRR, nml S1,S2, no m/r/g; Lungs CTAB; Abd soft, NT, no rebound or guarding; Ext R BTK amputation, L foot s/p 2nd toe amputation, swollen, skin is taught, forefoot is dark, slightly cool to touch.   Pending ABI L LE and arterial duplex L LE per Dr. Edilia Bo.   6:05 PM Patient to vascular lab.  7:31 PM Dr. Edilia Bo has looked at studies and states that there is no blood clot and bypass graft is patent. Patient to follow-up with Dr. Edilia Bo  in 2-3 weeks in his office. Will d/c to home.   MDM  Patient with h/o PAD, bypass grafting -- seen by Dr. Edilia Bo who has reviewed studies and has cleared patient to go home with follow-up. Patient given home care and return instructions.         Renne Crigler, [place] 05/18/11 1948

## 2011-05-18 NOTE — ED Notes (Signed)
Placed dressing on patient left foot toes.

## 2011-05-18 NOTE — Progress Notes (Signed)
VASCULAR LAB PRELIMINARY  ARTERIAL  ABI completed:    RIGHT    LEFT    PRESSURE WAVEFORM  PRESSURE WAVEFORM  BRACHIAL   BRACHIAL 200 triphasic  DP BKA  DP    AT BKA  AT 90 DAMPENED MONOPHASIC  PT BKA  PT  NOT FOUND  PER BKA  PER 146 MONOPHASIC  GREAT TOE  NA GREAT TOE  NA    RIGHT LEFT  ABI BKA 0.78   Fem pop bypass is patent with brisk biphasic flow.  VENOUS Left leg venous duplex completed.  Negative for deep vein thrombosis.  Vanna Scotland,  RVT 05/18/2011, 7:26 PM

## 2011-05-18 NOTE — ED Notes (Signed)
Left foot pain and swelling acouple of days no injury aches when he walks on it

## 2011-05-18 NOTE — Progress Notes (Signed)
Vascular Surgery Follow up  Venous Duplex shows no evidence of DVT in the left LE. He does have significant edema. The left fempop bypass graft is patent. ABI = 78% (so no significant change since May 2012) Patient has know severe tibial artery occlusive disease.  I'll arrange F/U in 2-3 weeks. If wounds worsen, could consider angio, but unlikely to have any further options for revascularization given that he had sever tibial artery occlusive disease back in 2009.  Will be available if needed.  Di Kindle. Edilia Bo, MD, FACS Beeper [phone] 05/18/2011

## 2011-05-19 ENCOUNTER — Telehealth: Payer: Self-pay | Admitting: Vascular Surgery

## 2011-05-19 NOTE — Telephone Encounter (Addendum)
Message copied by Shari Prows on Fri May 19, 2011 11:29 AM ------      Message from: Phillips Odor      Created: Fri May 19, 2011  9:29 AM      Regarding: FW: follow up                   ----- Message -----         From: Chuck Hint, MD         Sent: 05/18/2011   7:19 PM           To: Melene Plan, RN, Conley Simmonds Pullins, RN      Subject: follow up                                                This patient needs an office visit with me in 2-3 weeks.      Thanks      [facility] Scheduled appt on wed 06/07/11 at 1:45pm/lm for pt @ [facility] ph # and mailed letter. Jacklyn Shell

## 2011-06-06 ENCOUNTER — Encounter: Payer: Self-pay | Admitting: Vascular Surgery

## 2011-06-07 ENCOUNTER — Encounter (INDEPENDENT_AMBULATORY_CARE_PROVIDER_SITE_OTHER): Payer: Self-pay | Admitting: Vascular Surgery

## 2011-06-07 ENCOUNTER — Telehealth: Payer: Self-pay | Admitting: Vascular Surgery

## 2011-06-07 NOTE — Telephone Encounter (Signed)
Message copied by Fredrich Birks on Wed Jun 07, 2011  2:16 PM ------      Message from: Marcellus Scott      Created: Wed Jun 07, 2011  1:45 PM       [name] DOB: 03/28/1951 wants to reschedule his appointment for today. I sent him up front to reschedule.            Thanks      Revonda Standard

## 2011-06-07 NOTE — Telephone Encounter (Signed)
Rescheduled todays visit per patients request. New appt 06/14/11 @ 915am, dpm

## 2011-06-13 ENCOUNTER — Encounter: Payer: Self-pay | Admitting: Vascular Surgery

## 2011-06-14 ENCOUNTER — Encounter (HOSPITAL_COMMUNITY): Payer: Self-pay | Admitting: Pharmacy Technician

## 2011-06-14 ENCOUNTER — Encounter (INDEPENDENT_AMBULATORY_CARE_PROVIDER_SITE_OTHER): Payer: Medicare Other | Admitting: *Deleted

## 2011-06-14 ENCOUNTER — Ambulatory Visit (INDEPENDENT_AMBULATORY_CARE_PROVIDER_SITE_OTHER): Payer: Medicare Other | Admitting: Vascular Surgery

## 2011-06-14 ENCOUNTER — Encounter: Payer: Self-pay | Admitting: Vascular Surgery

## 2011-06-14 ENCOUNTER — Other Ambulatory Visit: Payer: Self-pay

## 2011-06-14 VITALS — BP 146/83 | HR 100 | Resp 18 | Ht 69.0 in | Wt 172.0 lb

## 2011-06-14 DIAGNOSIS — I739 Peripheral vascular disease, unspecified: Secondary | ICD-10-CM

## 2011-06-14 DIAGNOSIS — L98499 Non-pressure chronic ulcer of skin of other sites with unspecified severity: Secondary | ICD-10-CM

## 2011-06-14 DIAGNOSIS — I998 Other disorder of circulatory system: Secondary | ICD-10-CM | POA: Insufficient documentation

## 2011-06-14 MED ORDER — OXYCODONE-ACETAMINOPHEN 5-325 MG PO TABS: 1.0000 | ORAL_TABLET | ORAL | Status: DC | PRN

## 2011-06-14 NOTE — Assessment & Plan Note (Signed)
This patient presents with nonhealing wounds of the third and fifth toes of the left foot with evidence of severe infrainguinal arterial occlusive disease. Clearly this is a limb threatening problem. I recommended we proceed with arteriography in order to further assess this.I have reviewed with the patient the indications for arteriography. In addition, I have reviewed the potential complications of arteriography including but not limited to: Bleeding, arterial injury, arterial thrombosis, dye action, renal insufficiency, or other unpredictable medical problems. I have explained to the patient that if we find disease amenable to angioplasty we could potentially address this at the same time. I have discussed the potential complications of angioplasty and stenting, including but not limited to: Bleeding, arterial thrombosis, arterial injury, dissection, or the need for surgical intervention. I will try to schedule this for Dr. Myra Gianotti on Tuesday if he is unable to perform and I could potentially do a to stay between cases. If he does need and infrainguinal bypass certainly he would be at increased risk because of his age and previous cardiac history and I would favor a preoperative cardiac evaluation. If there are no options for revascularization and I think he would require primary amputation on the left. We'll make further recommendations pending the results of his arteriogram.

## 2011-06-14 NOTE — Progress Notes (Signed)
Encounter opened in error

## 2011-06-14 NOTE — Progress Notes (Signed)
Vascular and Vein Specialist of [facility]  Patient name: [name] MRN: [id number] DOB: November 04, 1931 Sex: male  REASON FOR VISIT: nonhealing wounds of left foot  HPI: Dyron Kawano is a 78 y.o. male was undergone previous right below the knee amputation. He presents with dry gangrene of the left third and fifth toes. He does not remember any specific injury to the foot and it is not clear how long he has had these wounds. He is unaware of any fever or chills. The wounds have continued to progress despite dressing changes at home area he was referred for vascular evaluation.  He is right below-the-knee amputation was performed in 2003. In 2009 he had undergone a left femoral to above-knee pop bypass with a prosthetic graft in the left second toe amputation. He does have a history of significant cardiac disease and is previously had complete heart block with a MI in the past. His had a pacemaker placed since then.  I do not get any history of significant claudication although his activity is very limited. He has mild rest pain of the left foot.  Past Medical History  Diagnosis Date  . Complete heart block     s/p PPM by JA 09/2009  . Hypercholesteremia   . HTN (hypertension)   . CAD (coronary artery disease)     s/p PTCA to PLV branch in 2002  . Non-insulin dependent diabetes mellitus   . PVD (peripheral vascular disease)     s/p right BKA  . CVA (cerebral infarction)     s/p PTA to middle cerebral artery    Family History  Problem Relation Age of Onset  . Heart failure Mother   . Heart failure Father   . Prostate cancer Brother     SOCIAL HISTORY: History  Substance Use Topics  . Smoking status: Never Smoker   . Smokeless tobacco: Never Used  . Alcohol Use: No    No Known Allergies  Current Outpatient Prescriptions  Medication Sig Dispense Refill  . aspirin EC 325 MG tablet Take 325 mg by mouth daily.        Marland Kitchen atorvastatin (LIPITOR) 80 MG tablet Take 80 mg by mouth daily.       . benazepril (LOTENSIN) 20 MG tablet Take 20 mg by mouth daily.        . carvedilol (COREG) 12.5 MG tablet Take 12.5 mg by mouth 2 (two) times daily with a meal.      . clopidogrel (PLAVIX) 75 MG tablet Take 75 mg by mouth daily.      Marland Kitchen spironolactone (ALDACTONE) 25 MG tablet Take 12.5 mg by mouth daily.      Marland Kitchen oxyCODONE-acetaminophen (ROXICET) 5-325 MG per tablet Take 1-2 tablets by mouth every 4 (four) hours as needed for pain.  30 tablet  0    REVIEW OF SYSTEMS: Arly.Keller ] denotes positive finding; [  ] denotes negative finding  CARDIOVASCULAR:  [ ]  chest pain   [ ]  chest pressure   [ ]  palpitations   [ ]  orthopnea   [ ]  dyspnea on exertion   [ ]  claudication   Arly.Keller ] rest pain left foot  [ ]  DVT   [ ]  phlebitis PULMONARY:   [ ]  productive cough   [ ]  asthma   [ ]  wheezing NEUROLOGIC:   [ ]  weakness  [ ]  paresthesias  [ ]  aphasia  [ ]  amaurosis  [ ]  dizziness HEMATOLOGIC:   [ ]  bleeding problems   [ ]   clotting disorders MUSCULOSKELETAL:  [ ]  joint pain   [ ]  joint swelling [ ]  leg swelling GASTROINTESTINAL: [ ]   blood in stool  [ ]   hematemesis GENITOURINARY:  [ ]   dysuria  [ ]   hematuria PSYCHIATRIC:  [ ]  history of major depression INTEGUMENTARY:  [ ]  rashes  [ ]  ulcers CONSTITUTIONAL:  [ ]  fever   [ ]  chills  PHYSICAL EXAM: Filed Vitals:   06/14/11 0950  BP: 146/83  Pulse: 100  Resp: 18  Height: 5\' 9"  (1.753 m)  Weight: 172 lb (78.019 kg)   Body mass index is 25.40 kg/(m^2). GENERAL: The patient is a well-nourished male, in no acute distress. The vital signs are documented above. CARDIOVASCULAR: There is a regular rate and rhythm without significant murmur appreciated. I do not detect carotid bruits. He has palpable femoral pulses. I cannot palpate a left popliteal or pedal pulses on the left.  PULMONARY: There is good air exchange bilaterally without wheezing or rales. ABDOMEN: Soft and non-tender with normal pitched bowel sounds.  MUSCULOSKELETAL: he has a right below the knee  amputation. NEUROLOGIC: No focal weakness or paresthesias are detected. SKIN: He has gangrene of the left third and fifth toes with a small amount of drainage. PSYCHIATRIC: The patient has a normal affect.  DATA:  I independently interpreted his ABIs in the office today. He has an ABI 45% on the left which is down from his previous ABI of 91% in March of 2012. I suspected is femoropopliteal bypass graft on the left is occluded. He has monophasic Doppler signals in the left posterior tibial and dorsalis pedis positions.  MEDICAL ISSUES:  Atherosclerosis of native arteries of the extremities with ulceration This patient presents with nonhealing wounds of the third and fifth toes of the left foot with evidence of severe infrainguinal arterial occlusive disease. Clearly this is a limb threatening problem. I recommended we proceed with arteriography in order to further assess this.I have reviewed with the patient the indications for arteriography. In addition, I have reviewed the potential complications of arteriography including but not limited to: Bleeding, arterial injury, arterial thrombosis, dye action, renal insufficiency, or other unpredictable medical problems. I have explained to the patient that if we find disease amenable to angioplasty we could potentially address this at the same time. I have discussed the potential complications of angioplasty and stenting, including but not limited to: Bleeding, arterial thrombosis, arterial injury, dissection, or the need for surgical intervention. I will try to schedule this for Dr. Myra Gianotti on Tuesday if he is unable to perform and I could potentially do a to stay between cases. If he does need and infrainguinal bypass certainly he would be at increased risk because of his age and previous cardiac history and I would favor a preoperative cardiac evaluation. If there are no options for revascularization and I think he would require primary amputation on the  left. We'll make further recommendations pending the results of his arteriogram.    Patrich Heinze S [facility] Vein Specialists of [place] Beeper: [phone]

## 2011-06-19 MED ORDER — SODIUM CHLORIDE 0.9 % IV SOLN
INTRAVENOUS | Status: DC
  Administered 2011-06-20: 06:00:00 via INTRAVENOUS

## 2011-06-20 ENCOUNTER — Ambulatory Visit (HOSPITAL_COMMUNITY)
Admission: RE | Admit: 2011-06-20 | Discharge: 2011-06-20 | Disposition: A | Discharge status: Home / Self Care | Payer: Medicare Other | Source: Ambulatory Visit | Attending: Surgery | Admitting: Surgery

## 2011-06-20 ENCOUNTER — Encounter (HOSPITAL_COMMUNITY)
Admission: RE | Disposition: A | Discharge status: Home / Self Care | Payer: Self-pay | Source: Ambulatory Visit | Attending: Surgery

## 2011-06-20 DIAGNOSIS — S88119A Complete traumatic amputation at level between knee and ankle, unspecified lower leg, initial encounter: Secondary | ICD-10-CM | POA: Insufficient documentation

## 2011-06-20 DIAGNOSIS — I10 Essential (primary) hypertension: Secondary | ICD-10-CM | POA: Insufficient documentation

## 2011-06-20 DIAGNOSIS — Z8673 Personal history of transient ischemic attack (TIA), and cerebral infarction without residual deficits: Secondary | ICD-10-CM | POA: Insufficient documentation

## 2011-06-20 DIAGNOSIS — E7800 Pure hypercholesterolemia, unspecified: Secondary | ICD-10-CM | POA: Insufficient documentation

## 2011-06-20 DIAGNOSIS — I70269 Atherosclerosis of native arteries of extremities with gangrene, unspecified extremity: Secondary | ICD-10-CM | POA: Insufficient documentation

## 2011-06-20 DIAGNOSIS — I739 Peripheral vascular disease, unspecified: Secondary | ICD-10-CM

## 2011-06-20 DIAGNOSIS — L97509 Non-pressure chronic ulcer of other part of unspecified foot with unspecified severity: Secondary | ICD-10-CM | POA: Insufficient documentation

## 2011-06-20 DIAGNOSIS — I2510 Atherosclerotic heart disease of native coronary artery without angina pectoris: Secondary | ICD-10-CM | POA: Insufficient documentation

## 2011-06-20 DIAGNOSIS — Z950 Presence of cardiac pacemaker: Secondary | ICD-10-CM | POA: Insufficient documentation

## 2011-06-20 DIAGNOSIS — L98499 Non-pressure chronic ulcer of skin of other sites with unspecified severity: Secondary | ICD-10-CM

## 2011-06-20 DIAGNOSIS — E119 Type 2 diabetes mellitus without complications: Secondary | ICD-10-CM | POA: Insufficient documentation

## 2011-06-20 HISTORY — PX: ABDOMINAL AORTAGRAM: SHX5454

## 2011-06-20 HISTORY — PX: LOWER EXTREMITY ANGIOGRAM: SHX5508

## 2011-06-20 LAB — GLUCOSE, CAPILLARY: Glucose-Capillary: 111 mg/dL — ABNORMAL HIGH (ref 70–99)

## 2011-06-20 LAB — POCT I-STAT, CHEM 8
BUN: 22 mg/dL (ref 6–23)
Chloride: 106 mEq/L (ref 96–112)
Creatinine, Ser: 1.1 mg/dL (ref 0.50–1.35)
Potassium: 5 mEq/L (ref 3.5–5.1)
Sodium: 137 mEq/L (ref 135–145)
TCO2: 27 mmol/L (ref 0–100)

## 2011-06-20 SURGERY — ABDOMINAL AORTAGRAM
Anesthesia: LOCAL

## 2011-06-20 MED ORDER — GUAIFENESIN-DM 100-10 MG/5ML PO SYRP: 15.0000 mL | ORAL_SOLUTION | ORAL | Status: DC | PRN

## 2011-06-20 MED ORDER — PHENOL 1.4 % MT LIQD: 1.0000 | OROMUCOSAL | Status: DC | PRN

## 2011-06-20 MED ORDER — ALUM & MAG HYDROXIDE-SIMETH 200-200-20 MG/5ML PO SUSP: 15.0000 mL | ORAL | Status: DC | PRN

## 2011-06-20 MED ORDER — METOPROLOL TARTRATE 1 MG/ML IV SOLN: 2.0000 mg | INTRAVENOUS | Status: DC | PRN

## 2011-06-20 MED ORDER — HYDRALAZINE HCL 20 MG/ML IJ SOLN: 10.0000 mg | INTRAMUSCULAR | Status: DC | PRN

## 2011-06-20 MED ORDER — OXYCODONE-ACETAMINOPHEN 5-325 MG PO TABS: 1.0000 | ORAL_TABLET | ORAL | Status: DC | PRN

## 2011-06-20 MED ORDER — SODIUM CHLORIDE 0.9 % IV SOLN: 1.0000 mL/kg/h | INTRAVENOUS | Status: DC

## 2011-06-20 MED ORDER — ONDANSETRON HCL 4 MG/2ML IJ SOLN: 4.0000 mg | Freq: Four times a day (QID) | INTRAMUSCULAR | Status: DC | PRN

## 2011-06-20 MED ORDER — LIDOCAINE HCL (PF) 1 % IJ SOLN
INTRAMUSCULAR | Status: AC
  Filled 2011-06-20: qty 30

## 2011-06-20 MED ORDER — ACETAMINOPHEN 325 MG PO TABS: 325.0000 mg | ORAL_TABLET | ORAL | Status: DC | PRN

## 2011-06-20 MED ORDER — HEPARIN (PORCINE) IN NACL 2-0.9 UNIT/ML-% IJ SOLN
INTRAMUSCULAR | Status: AC
  Filled 2011-06-20: qty 1000

## 2011-06-20 MED ORDER — ACETAMINOPHEN 325 MG RE SUPP: 325.0000 mg | RECTAL | Status: DC | PRN

## 2011-06-20 MED ORDER — LABETALOL HCL 5 MG/ML IV SOLN
10.0000 mg | INTRAVENOUS | Status: DC | PRN
  Administered 2011-06-20: 10 mg via INTRAVENOUS
  Filled 2011-06-20: qty 4

## 2011-06-20 MED ORDER — FENTANYL CITRATE 0.05 MG/ML IJ SOLN
INTRAMUSCULAR | Status: AC
  Filled 2011-06-20: qty 2

## 2011-06-20 NOTE — Op Note (Signed)
Vascular and Vein Specialists of [facility]  Patient name: [name] MRN: [id number] DOB: [birth year] Sex: male  06/20/2011 Pre-operative Diagnosis: Left leg ulcer Post-operative diagnosis:  Same Surgeon:  Jorge Ny Procedure Performed:  1.  ultrasound access right femoral artery  2.  abdominal aortogram  3.  left lower extremity runoff  4.  second order catheterization    Indications:  The patient comes in today for angiography. He is status post right leg amputation. He has undergone femoral-popliteal bypass graft on the left. He now has gangrenous toes.  Procedure:  The patient was identified in the holding area and taken to room 8.  The patient was then placed supine on the table and prepped and draped in the usual sterile fashion.  A time out was called.  Ultrasound was used to evaluate the right common femoral artery.  It was patent .  A digital ultrasound image was acquired. An 18-gauge needle was used to access the right common femoral artery under ultrasound guidance. A Benson wire was advanced without resistance and a 5 French sheath was placed.  An omniflush catheter was advanced over the wire to the level of L-1.  An abdominal angiogram was obtained.  Next, using the omniflush catheter and a benson wire as well as a 4 French straight catheter, a glide wire, and a Rosen wire, the aortic bifurcation was crossed and the catheter was placed into theleft external iliac artery and left runoff was obtained.   Findings:   Aortogram:  The visualized portions of the suprarenal abdominal aorta showed no significant disease. There are single renal arteries without evidence of stenosis. The infrarenal abdominal aorta is widely patent. Bilateral common external and internal iliac arteries are widely patent.  Left Lower Extremity:  The left common femoral artery is patent throughout it's course. The left profunda femoral artery is widely patent. The left superficial femoral artery is  occluded. There is a bypass graft originating from the distal common femoral artery which is patent throughout it's course. The distal anastomosis is somewhat difficult to evaluate. There does appear to be a stenosis near the distal anastomosis however, there is occlusion of all 3 tibial vessels with poor reconstitution distally.  Intervention:  None performed today as the patient does not have options for revascularization.  Impression:  #1  patent left femoral-popliteal bypass graft, however there is occlusion of all 3 tibial vessels with poor reconstitution. Therefore, the patient does not have options for revascularization   V. Durene Cal, M.D. [facility] Special[facility] of [place] Office: [phone] Pager:  [phone]

## 2011-06-20 NOTE — Interval H&P Note (Signed)
History and Physical Interval Note:  06/20/2011 7:26 AM  [name]  has presented today for surgery, with the diagnosis of PVD  The various methods of treatment have been discussed with the patient and family. After consideration of risks, benefits and other options for treatment, the patient has consented to  Procedure(s) (LRB): ABDOMINAL AORTAGRAM (N/A) as a surgical intervention .  The patients' history has been reviewed, patient examined, no change in status, stable for surgery.  I have reviewed the patients' chart and labs.  Questions were answered to the patient's satisfaction.     Tatisha Cerino IV, V. WELLS

## 2011-06-20 NOTE — Discharge Instructions (Signed)

## 2011-06-20 NOTE — H&P (View-Only) (Signed)
Vascular and Vein Specialist of   Patient name: [name] MRN: [id number] DOB: 05/15/1931 Sex: male  REASON FOR VISIT: nonhealing wounds of left foot  HPI: [name] is a 78 y.o. male was undergone previous right below the knee amputation. He presents with dry gangrene of the left third and fifth toes. He does not remember any specific injury to the foot and it is not clear how long he has had these wounds. He is unaware of any fever or chills. The wounds have continued to progress despite dressing changes at home area he was referred for vascular evaluation.  He is right below-the-knee amputation was performed in 2003. In 2009 he had undergone a left femoral to above-knee pop bypass with a prosthetic graft in the left second toe amputation. He does have a history of significant cardiac disease and is previously had complete heart block with a MI in the past. His had a pacemaker placed since then.  I do not get any history of significant claudication although his activity is very limited. He has mild rest pain of the left foot.  Past Medical History  Diagnosis Date  . Complete heart block     s/p PPM by JA 09/2009  . Hypercholesteremia   . HTN (hypertension)   . CAD (coronary artery disease)     s/p PTCA to PLV branch in 2002  . Non-insulin dependent diabetes mellitus   . PVD (peripheral vascular disease)     s/p right BKA  . CVA (cerebral infarction)     s/p PTA to middle cerebral artery    Family History  Problem Relation Age of Onset  . Heart failure Mother   . Heart failure Father   . Prostate cancer Brother     SOCIAL HISTORY: History  Substance Use Topics  . Smoking status: Never Smoker   . Smokeless tobacco: Never Used  . Alcohol Use: No    No Known Allergies  Current Outpatient Prescriptions  Medication Sig Dispense Refill  . aspirin EC 325 MG tablet Take 325 mg by mouth daily.        . atorvastatin (LIPITOR) 80 MG tablet Take 80 mg by mouth daily.       . benazepril (LOTENSIN) 20 MG tablet Take 20 mg by mouth daily.        . carvedilol (COREG) 12.5 MG tablet Take 12.5 mg by mouth 2 (two) times daily with a meal.      . clopidogrel (PLAVIX) 75 MG tablet Take 75 mg by mouth daily.      . spironolactone (ALDACTONE) 25 MG tablet Take 12.5 mg by mouth daily.      . oxyCODONE-acetaminophen (ROXICET) 5-325 MG per tablet Take 1-2 tablets by mouth every 4 (four) hours as needed for pain.  30 tablet  0    REVIEW OF SYSTEMS: [X ] denotes positive finding; [  ] denotes negative finding  CARDIOVASCULAR:  [ ] chest pain   [ ] chest pressure   [ ] palpitations   [ ] orthopnea   [ ] dyspnea on exertion   [ ] claudication   [X ] rest pain left foot  [ ] DVT   [ ] phlebitis PULMONARY:   [ ] productive cough   [ ] asthma   [ ] wheezing NEUROLOGIC:   [ ] weakness  [ ] paresthesias  [ ] aphasia  [ ] amaurosis  [ ] dizziness HEMATOLOGIC:   [ ] bleeding problems   [ ]   clotting disorders MUSCULOSKELETAL:  [ ] joint pain   [ ] joint swelling [ ] leg swelling GASTROINTESTINAL: [ ]  blood in stool  [ ]  hematemesis GENITOURINARY:  [ ]  dysuria  [ ]  hematuria PSYCHIATRIC:  [ ] history of major depression INTEGUMENTARY:  [ ] rashes  [ ] ulcers CONSTITUTIONAL:  [ ] fever   [ ] chills  PHYSICAL EXAM: Filed Vitals:   06/14/11 0950  BP: 146/83  Pulse: 100  Resp: 18  Height: 5' 9" (1.753 m)  Weight: 172 lb (78.019 kg)   Body mass index is 25.40 kg/(m^2). GENERAL: The patient is a well-nourished male, in no acute distress. The vital signs are documented above. CARDIOVASCULAR: There is a regular rate and rhythm without significant murmur appreciated. I do not detect carotid bruits. He has palpable femoral pulses. I cannot palpate a left popliteal or pedal pulses on the left.  PULMONARY: There is good air exchange bilaterally without wheezing or rales. ABDOMEN: Soft and non-tender with normal pitched bowel sounds.  MUSCULOSKELETAL: he has a right below the knee  amputation. NEUROLOGIC: No focal weakness or paresthesias are detected. SKIN: He has gangrene of the left third and fifth toes with a small amount of drainage. PSYCHIATRIC: The patient has a normal affect.  DATA:  I independently interpreted his ABIs in the office today. He has an ABI 45% on the left which is down from his previous ABI of 91% in March of 2012. I suspected is femoropopliteal bypass graft on the left is occluded. He has monophasic Doppler signals in the left posterior tibial and dorsalis pedis positions.  MEDICAL ISSUES:  Atherosclerosis of native arteries of the extremities with ulceration This patient presents with nonhealing wounds of the third and fifth toes of the left foot with evidence of severe infrainguinal arterial occlusive disease. Clearly this is a limb threatening problem. I recommended we proceed with arteriography in order to further assess this.I have reviewed with the patient the indications for arteriography. In addition, I have reviewed the potential complications of arteriography including but not limited to: Bleeding, arterial injury, arterial thrombosis, dye action, renal insufficiency, or other unpredictable medical problems. I have explained to the patient that if we find disease amenable to angioplasty we could potentially address this at the same time. I have discussed the potential complications of angioplasty and stenting, including but not limited to: Bleeding, arterial thrombosis, arterial injury, dissection, or the need for surgical intervention. I will try to schedule this for Dr. Brabham on Tuesday if he is unable to perform and I could potentially do a to stay between cases. If he does need and infrainguinal bypass certainly he would be at increased risk because of his age and previous cardiac history and I would favor a preoperative cardiac evaluation. If there are no options for revascularization and I think he would require primary amputation on the  left. We'll make further recommendations pending the results of his arteriogram.    Tinika Bucknam S Vascular [facility] Vein Specialists of [place] Beeper: [phone]    

## 2011-07-07 ENCOUNTER — Encounter: Payer: Self-pay | Admitting: Vascular Surgery

## 2011-07-07 ENCOUNTER — Ambulatory Visit (INDEPENDENT_AMBULATORY_CARE_PROVIDER_SITE_OTHER): Payer: Medicare Other | Admitting: Vascular Surgery

## 2011-07-07 VITALS — BP 125/90 | HR 63 | Temp 97.7°F | Ht 69.0 in | Wt 174.0 lb

## 2011-07-07 DIAGNOSIS — I999 Unspecified disorder of circulatory system: Secondary | ICD-10-CM

## 2011-07-07 DIAGNOSIS — I998 Other disorder of circulatory system: Secondary | ICD-10-CM

## 2011-07-07 MED ORDER — OXYCODONE HCL 5 MG PO TABS: 5.0000 mg | ORAL_TABLET | Freq: Three times a day (TID) | ORAL | Status: AC | PRN

## 2011-07-07 NOTE — Progress Notes (Signed)
VASCULAR & VEIN SPECIALISTS OF [place]  Established Critical Limb Ischemia Patient  History of Present Illness  [name] is a 78 y.o. (07-Nov-1931) male who presents with chief complaint: L foot pain.  The patient has rest pain and wounds include: L foot gangrene.  The patient notes symptoms have progressed.  The patient's treatment regimen currently included: maximal medical management.  Pt had diagnostic angiogram on LLE on 06/20/11 w/ Dr. Myra Gianotti which demonstrated patent L fem-pop bypass but extensive non-revascularizable tibial disease.  Past Medical History, Past Surgical History, Social History, Family History, Medications, Allergies, and Review of Systems are unchanged from previous evaluation on 06/20/11.  Physical Examination  Filed Vitals:   07/07/11 1121  BP: 125/90  Pulse: 63  Temp: 97.7 F (36.5 C)  TempSrc: Oral  Height: 5\' 9"  (1.753 m)  Weight: 174 lb (78.926 kg)  SpO2: 80%   Body mass index is 25.70 kg/(m^2).   General: A&O x 3, WDWN  Eyes: PERRLA, EOMI  Pulmonary: Sym exp, good air movt, CTAB, no rales, rhonchi, & wheezing  Cardiac: RRR, Nl S1, S2, no Murmurs, rubs or gallops, no palpable pulse in L foot  Gastrointestinal: soft, NTND, -G/R, - HSM, - masses, - CVAT B  Musculoskeletal: M/S grossly intact, L foot with healed 2nd toe amp, 3rd toe distal phalange dry gangrene, 5th toe gangrene with atrophy of entire toe  Neurologic: Pain and light touch intact in extremities , Motor exam as listed above  Medical Decision Making  [name] is a 78 y.o. male who presents with: L foot dry gangrene (CLI).   I reviewed the patient's angiogram and agree that there is no tibial revascularization option  I am prescribing the patient Oxycodone for pain control and he will be overbooked into Dr. Adele Dan schedule to discuss the level of the amputation.  Meanwhile, the patient is going to discuss with his family whether to proceed with an L leg  amputation.  Leonides Sake, MD [facility] Special[facility] of [place] Office: [phone] Pager: [phone]  07/07/2011, 12:15 PM

## 2011-07-11 ENCOUNTER — Encounter: Payer: Self-pay | Admitting: Vascular Surgery

## 2011-07-12 ENCOUNTER — Encounter: Payer: Self-pay | Admitting: Vascular Surgery

## 2011-07-12 ENCOUNTER — Encounter: Payer: Self-pay | Admitting: Internal Medicine

## 2011-07-12 ENCOUNTER — Ambulatory Visit (INDEPENDENT_AMBULATORY_CARE_PROVIDER_SITE_OTHER): Payer: Medicare Other | Admitting: *Deleted

## 2011-07-12 ENCOUNTER — Ambulatory Visit (INDEPENDENT_AMBULATORY_CARE_PROVIDER_SITE_OTHER): Payer: Medicare Other | Admitting: Vascular Surgery

## 2011-07-12 VITALS — BP 147/87 | HR 100 | Resp 20 | Ht 69.0 in | Wt 175.0 lb

## 2011-07-12 DIAGNOSIS — I999 Unspecified disorder of circulatory system: Secondary | ICD-10-CM

## 2011-07-12 DIAGNOSIS — L98499 Non-pressure chronic ulcer of skin of other sites with unspecified severity: Secondary | ICD-10-CM

## 2011-07-12 DIAGNOSIS — I998 Other disorder of circulatory system: Secondary | ICD-10-CM

## 2011-07-12 DIAGNOSIS — I739 Peripheral vascular disease, unspecified: Secondary | ICD-10-CM

## 2011-07-12 DIAGNOSIS — I442 Atrioventricular block, complete: Secondary | ICD-10-CM

## 2011-07-12 DIAGNOSIS — I70209 Unspecified atherosclerosis of native arteries of extremities, unspecified extremity: Secondary | ICD-10-CM

## 2011-07-12 MED ORDER — CEPHALEXIN 500 MG PO CAPS: 500.0000 mg | ORAL_CAPSULE | Freq: Three times a day (TID) | ORAL | Status: DC

## 2011-07-12 NOTE — Progress Notes (Signed)
PPM check 

## 2011-07-12 NOTE — Progress Notes (Signed)
Vascular and Vein Specialist of [facility]  Patient name: [name] MRN: [id number] DOB: [birth year] Sex: male  REASON FOR VISIT: follow up of nonhealing wounds of the left foot.  HPI: [name] is a 78 y.o. male who presented with a nonhealing wound of the left foot. He underwent an arteriogram which showed severe tibial artery occlusive disease. There were no options for revascularization. He comes in for a routine follow up visit. He has had no fever or chills. He's been doing dressing changes to his foot himself.   REVIEW OF SYSTEMS: Arly.Keller ] denotes positive finding; [  ] denotes negative finding  CARDIOVASCULAR:  [ ]  chest pain   [ ]  dyspnea on exertion    CONSTITUTIONAL:  [ ]  fever   [ ]  chills  PHYSICAL EXAM: Filed Vitals:   07/12/11 1545  BP: 147/87  Pulse: 100  Resp: 20  Height: 5\' 9"  (1.753 m)  Weight: 175 lb (79.379 kg)   Body mass index is 25.84 kg/(m^2). GENERAL: The patient is a well-nourished male, in no acute distress. The vital signs are documented above. CARDIOVASCULAR: There is a regular rate and rhythm  PULMONARY: There is good air exchange bilaterally without wheezing or rales. The patient has dry gangrene of the left fifth and third toes. There is no evidence of healing. There is slight odor to the wound.  I have reviewed his arteriogram which demonstrates a patent left femoral to above-knee popliteal artery bypass with severe tibial artery occlusive disease below that. There are no options for revascularization.  MEDICAL ISSUES:  Critical lower limb ischemia This patient has dry gangrene of the left fifth and third toes. He has undergone arteriography which demonstrates that his left femoral to above-knee popliteal artery bypass graft is patent. However, he has severe tibial artery occlusive disease with no options for revascularization. This reason given the severity of his tibial artery occlusive disease I do not think he has adequate circulation to heal  these wounds and will require a primary amputation. Currently his wife is having problems at home and he is reluctant to proceed with surgery. I've explained that if he waits too long, the infection can spread and could compromise the level of amputation and also could be life-threatening. I think he has an 80% chance of healing a below the knee amputation and would be willing to schedule this when he is agreeable. In the meantime I've encouraged him to soak his foot daily and lukewarm bowel soap soaks to keep the wound dry. I've also instructed him to keep dry gauze between the toes and then wrapped the foot with Kerlix. Finally have given her a prescription for Keflex. I'll plan on seeing him back in 3 weeks unless he call sooner. He does have a below the knee amputation on the right side and does use his prosthesis. I think it would be unlikely that he would be able to ambulate with bilateral prostheses given his weight and age but not impossible.   [name] S V[facility] Ve[facility] Special[facility] of [place] Beeper: [phone]

## 2011-07-12 NOTE — Assessment & Plan Note (Signed)
This patient has dry gangrene of the left fifth and third toes. He has undergone arteriography which demonstrates that his left femoral to above-knee popliteal artery bypass graft is patent. However, he has severe tibial artery occlusive disease with no options for revascularization. This reason given the severity of his tibial artery occlusive disease I do not think he has adequate circulation to heal these wounds and will require a primary amputation. Currently his wife is having problems at home and he is reluctant to proceed with surgery. I've explained that if he waits too long, the infection can spread and could compromise the level of amputation and also could be life-threatening. I think he has an 80% chance of healing a below the knee amputation and would be willing to schedule this when he is agreeable. In the meantime I've encouraged him to soak his foot daily and lukewarm bowel soap soaks to keep the wound dry. I've also instructed him to keep dry gauze between the toes and then wrapped the foot with Kerlix. Finally have given her a prescription for Keflex. I'll plan on seeing him back in 3 weeks unless he call sooner. He does have a below the knee amputation on the right side and does use his prosthesis. I think it would be unlikely that he would be able to ambulate with bilateral prostheses given his weight and age but not impossible.

## 2011-07-17 ENCOUNTER — Emergency Department (HOSPITAL_COMMUNITY): Payer: Medicare Other

## 2011-07-17 ENCOUNTER — Encounter (HOSPITAL_COMMUNITY): Payer: Self-pay | Admitting: *Deleted

## 2011-07-17 ENCOUNTER — Emergency Department (HOSPITAL_COMMUNITY)
Admission: EM | Admit: 2011-07-17 | Discharge: 2011-07-18 | Disposition: A | Discharge status: Home / Self Care | Payer: Medicare Other | Attending: Emergency Medicine | Admitting: Emergency Medicine

## 2011-07-17 DIAGNOSIS — I999 Unspecified disorder of circulatory system: Secondary | ICD-10-CM | POA: Insufficient documentation

## 2011-07-17 DIAGNOSIS — I10 Essential (primary) hypertension: Secondary | ICD-10-CM | POA: Insufficient documentation

## 2011-07-17 DIAGNOSIS — I998 Other disorder of circulatory system: Secondary | ICD-10-CM

## 2011-07-17 DIAGNOSIS — I96 Gangrene, not elsewhere classified: Secondary | ICD-10-CM | POA: Insufficient documentation

## 2011-07-17 DIAGNOSIS — E119 Type 2 diabetes mellitus without complications: Secondary | ICD-10-CM | POA: Insufficient documentation

## 2011-07-17 DIAGNOSIS — S88119A Complete traumatic amputation at level between knee and ankle, unspecified lower leg, initial encounter: Secondary | ICD-10-CM | POA: Insufficient documentation

## 2011-07-17 DIAGNOSIS — M79609 Pain in unspecified limb: Secondary | ICD-10-CM | POA: Insufficient documentation

## 2011-07-17 LAB — PACEMAKER DEVICE OBSERVATION
ATRIAL PACING PM: 3.8
BAMS-0001: 150 {beats}/min
RV LEAD IMPEDENCE PM: 476 Ohm
RV LEAD THRESHOLD: 0.5 V

## 2011-07-17 MED ORDER — HYDROMORPHONE HCL PF 1 MG/ML IJ SOLN: 1.0000 mg | Freq: Once | INTRAMUSCULAR | Status: DC

## 2011-07-17 MED ORDER — SODIUM CHLORIDE 0.9 % IV SOLN: INTRAVENOUS | Status: DC

## 2011-07-17 MED ORDER — ONDANSETRON HCL 4 MG/2ML IJ SOLN: 4.0000 mg | Freq: Once | INTRAMUSCULAR | Status: DC

## 2011-07-17 NOTE — ED Notes (Signed)
Patient states pain in left foot x 1 month, patient denies injuries, patient states foot with slight swelling from normal, patient states toes are turning a darker color than normal

## 2011-07-18 ENCOUNTER — Encounter: Payer: Self-pay | Admitting: Vascular Surgery

## 2011-07-18 ENCOUNTER — Ambulatory Visit (INDEPENDENT_AMBULATORY_CARE_PROVIDER_SITE_OTHER): Payer: Medicare Other | Admitting: Vascular Surgery

## 2011-07-18 VITALS — BP 150/84 | HR 80 | Temp 97.7°F | Ht 69.0 in | Wt 175.0 lb

## 2011-07-18 DIAGNOSIS — I7025 Atherosclerosis of native arteries of other extremities with ulceration: Secondary | ICD-10-CM | POA: Insufficient documentation

## 2011-07-18 DIAGNOSIS — L98499 Non-pressure chronic ulcer of skin of other sites with unspecified severity: Secondary | ICD-10-CM

## 2011-07-18 DIAGNOSIS — I739 Peripheral vascular disease, unspecified: Secondary | ICD-10-CM | POA: Insufficient documentation

## 2011-07-18 MED ORDER — AMOXICILLIN-POT CLAVULANATE 875-125 MG PO TABS: 1.0000 | ORAL_TABLET | Freq: Two times a day (BID) | ORAL | Status: DC

## 2011-07-18 MED ORDER — OXYCODONE-ACETAMINOPHEN 5-325 MG PO TABS: 2.0000 | ORAL_TABLET | Freq: Three times a day (TID) | ORAL | Status: AC | PRN

## 2011-07-18 MED ORDER — HYDROMORPHONE HCL PF 2 MG/ML IJ SOLN
2.0000 mg | Freq: Once | INTRAMUSCULAR | Status: AC
  Administered 2011-07-18: 2 mg via INTRAMUSCULAR
  Filled 2011-07-18: qty 1

## 2011-07-18 NOTE — Discharge Instructions (Signed)
Gangrene Gangrene is the decay or death of an organ or tissue caused by loss of blood supply. It may be caused by infectious or inflammatory processes. Or it may be caused by degenerative changes in longstanding diseases such as diabetes. It may also occur following injuries or surgical procedures. TYPES OF GANGRENE There are three major types of gangrene: dry, moist, and gas (a type of moist gangrene).  Dry gangrene is a condition that results when one or more arteries become blocked. Arteries are muscular vessels which carry blood away from the heart and around the body. In this type of gangrene, the tissue slowly dies, due to loss of blood supply. But it does not become infected. The affected area becomes cold and black. It begins to dry out and wither. Eventually it drops off over a period of weeks or months. Dry gangrene is most common in people with advanced blockages of the arteries (arteriosclerosis or hardening of the arteries) resulting from diabetes.   Moist gangrene occurs in the toes, feet, or legs after a crushing injury. Or it can be a result of some other problem that causes blood flow in an area to suddenly stop. When blood flow stops, bacteria begin to invade the muscle and grow. It multiplies quickly, without the body's immune system stopping it.   Gas gangrene is also called muscle death (myonecrosis). It is a type of moist gangrene commonly caused by a germ (bacterial) infection. This is usually caused by a germ which can live in an area of little or no oxygen. Once present in tissue, these bacteria produce gasses and poisonous toxins as they grow. These germs are normally found in the gut, respiratory, and male urinary tract. They often infect thigh amputation wounds commonly in people who have lost control of their bowel functions (incontinence). Gangrene, incontinence, and weakness often are found in patients with diabetes. It is in the amputation stump of diabetic patients that  gas gangrene often happens. The most common germ to cause gas gangrene is Clostridia. Other germs which cause moist gangrene include various bacterial strains. These include Streptococcus and Staphylococcus. A serious, but rare form of infection with Group A Streptococcus can slow blood flow. If untreated, it can progress to gangrene. This is more commonly called necrotizing fasciitis. This is an infection of the skin and tissues directly beneath the skin. This is sometimes called the "flesh eating bacteria". It is called this because it can rapidly kill large areas of flesh in the body. It requires immediate surgical treatment or it will result in death.  CAUSES   Some illnesses can cause gangrene because the vessels carrying the blood are already diseased. Examples are:   Long standing illnesses, such as diabetes mellitus or arteriosclerosis.   Other diseases affecting the blood vessels, such as Buerger's disease or Raynaud's disease.  Other causes of gangrene include:   Open bone breaks.   Burns.   Injections given under the skin or in a muscle.   Gangrene may occur following surgery, particularly in individuals with diabetes or other long-term (chronic) disease.   Gas gangrene can be also a complication of dry gangrene. Or it can happen suddenly along with an underlying cancer.  Moist gangrene and gas gangrene are different. Gas gangrene usually involves only the muscle. It does not involve the skin as much. In moist or gas gangrene, there is a feeling of heaviness in the affected area that is followed by severe pain. The pain is caused by swelling from  fluid or gas accumulation in the tissues. This pain is the worst, on average, between one to four days following the injury. It can range from several hours to several weeks. The swollen skin may at first be blistered, red, and warm to the touch. Then it changes to a bronze, brown, or black color. In about 80% of cases, the affected and  surrounding tissues may give off crackling sounds (crepitus). This is a result of gas bubbles accumulating under the skin. The gas may be felt beneath the skin. It feels and sounds like pushing on popcorn under a blanket of skin. In wet gangrene, the pus is foul-smelling. In gas gangrene, there is no true pus, just an almost "sweet" smelling watery discharge. If the bacterial toxins spread to the bloodstream, the following may happen:  A higher than normal temperature.   Fast heart rate and breathing.   Changed mental state.   Loss of appetite.   Diarrhea.   Vomiting.   Shock.  Gas gangrene can be a life-threatening condition and should receive prompt medical attention. SYMPTOMS  Areas of either dry or moist gangrene are first noticed as a red line on the skin that marks the border of the affected tissues.   As tissues begin to die, dry gangrene may cause pain in the early stages. Or it may go unnoticed.   This happens especially in the elderly or in individuals with decreased ability to feel pain.   At first, the area becomes cold, numb, and pale. Later its color changes to brown, then black. This dead tissue will gradually separate from the healthy tissue and fall off.  DIAGNOSIS  Diagnosis of gangrene is based on patient history, physical examination, and results of blood and other lab tests.  A caregiver will look for a history of:   Recent damage caused by an accident(trauma).   Surgery.   Cancer.   Chronic disease.   Blood tests can be used to find out if infection is present and find how much it has spread.   Drainage from a wound, or a tissue sample obtained through surgical exploration, may be cultured to:   Identify the germ causing the infection.   Help in figuring out which medicine (antibiotic) will be most effective.   Gas accumulation and muscle death (myonecrosis) may not be visible. So X ray studies and more sophisticated imaging techniques may be  helpful in making a diagnosis. They include:   Computed tomography scans (CT scans).   Magnetic resonance imaging (MRI).   These techniques are not sufficient alone to provide an accurate diagnosis of gangrene, though.   Precise diagnosis of gas gangrene often requires surgical exploration of the wound.   During such a procedure, the exposed muscle may appear pale, beefy-red. In the most advanced stages, it may be black. If infected, the muscle will fail to contract with stimulation, and the cut surface will not bleed.  TREATMENT  Gas gangrene is a medical emergency. It requires immediate surgery and antibiotics. If the infection spreads to the bloodstream and infects vital organs, it can rapidly result in death.   Areas of dry gangrene that remain free from infection (aseptic) in the arms/hands and legs/feet ( extremities) are often left to wither and fall off. Treatments applied to the wound on the surface are generally not effective without enough blood supply to support wound healing.   Evaluation by a vascular surgeon, along with x-rays to determine blood supply and circulation to the affected  area, can help figure out if surgery would be helpful.   Once the cause of infection is found, moist gangrene requires immediate intravenous, intramuscular, and/or topical broad-spectrum antibiotic therapy. Also, the infected tissue must be removed surgically. Amputation of the affected limb may be needed. Pain medications (analgesics) are prescribed for pain. Fluids injected into the veins(intravenous fluids) and, occasionally, blood transfusions are used to treat shock and replenish red blood cells and electrolytes. Plenty of water and healthy foods are needed for wound healing.   Some cases of gangrene are treated by giving oxygen under pressure greater than that of the atmosphere (hyperbaric) to the patient. This happens in a specially designed chamber. The idea behind using hyperbaric oxygen is  that more oxygen will become dissolved in the patient's bloodstream. So more oxygen will be delivered to the gangrenous areas. By providing the right amount of oxygenation, the body's ability to fight off the bacterial infection is believed to be improved. There is a direct harmful effect on the bacteria that thrive in an oxygen-free environment. Some studies have shown that the use of hyperbaric oxygen relieves pain, reduces the number of amputations required, and reduces the extent of surgical debridement (removal of dead or damaged tissue). There is not complete agreement among health care workers, whether this method of treatment has any value. Patients receiving hyperbaric oxygen treatments must be watched closely for evidence of oxygen toxicity. Signs of this toxicity include:   Slow heart rate.   Sweating.   Ringing in the ears.   Shortness of breath.   Nausea and vomiting.   Twitching of the lips/cheeks/eyelids/nose.   Convulsions.   The emotional needs of the patient must also be met. The individual with gangrene should be offered moral support, along with an opportunity to share questions and concerns. In addition, particularly in cases where amputation was required, physical, vocational, and rehabilitation therapy will also be required.  Except in cases where the infection has spread to the blood stream, the result of treated gangrene is usually favorable. Anaerobic wound infection (an infection with a germ that does not require oxygen to grow) can progress quickly from initial injury to gas gangrene within one to two days. About one fourth to one fifth of sufferers will die as the infection enters the blood stream. If recognized and treated early, about 80% of those with gas gangrene will survive, and only 15-20% will require amputation. Often the patient with dry gangrene has many other health problems. These complicate recovery. It is often those other system failures that can prove  fatal. PREVENTION  Patients with diabetes or severe arteriosclerosis should take special care of their hands and feet. This is due to the risk of infection associated with even minor injuries. Education about good foot care is important. Poor blood flow as a result diseased blood vessels will lessen the body's defenses against invading germs (bacteria). Your caregiver will take steps to improve circulation whenever possible. All injuries to skin or tissue should be cared for immediately. Dying or infected skin should be removed immediately. This will prevent the spread of bacteria. Wounds into the belly should be surgically treated and damage repaired early. This should be followed up with antibiotic treatment. Patients undergoing non-emergency (elective) intestinal surgery may receive preventive antibiotic therapy. Use of antibiotics prior to and directly following surgery has been shown to significantly reduce the rate of infection.  SEEK IMMEDIATE MEDICAL CARE IF:  You show signs of gas gangrene. You develop fever, new confusion,  shortness of breath, dizziness or fainting, red streaks up your left foot or leg, new pain to your left leg, or other concerns. MAKE SURE YOU:  Call Dr. Adele Dan office today for recheck.  Understand these instructions.   Will watch your condition.   Will get help right away if you are not doing well or get worse.  Document Released: 11/04/2003 Document Revised: 12/22/2010 Document Reviewed: 01/02/2005 [facility] Patient Information 2012 [place], [place].

## 2011-07-18 NOTE — ED Provider Notes (Signed)
History     CSN: [id number]  Arrival date & time 07/17/11  2217   First MD Initiated Contact with Patient 07/17/11 2359      Chief Complaint  Patient presents with  . 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 third and fifth toes with chronic nonhealing ulcers of his left foot, he was recently placed on Keflex within the last week, the patient did not want a below-the-knee amputation scheduled at last vascular surgery clinic visit, the patient he did have a mild rest pain in the left foot at the time of his last vascular surgery clinic visit, patient has had pain in that left foot 24 hours a day gradually worsening since his last clinic visit, the patient reports no color change to the foot and no purulent drainage or significant new swelling to the foot, however since his pain is gradually worsened he would like pain medicine for tonight so he can follow up with vascular surgery, he has had no fever, confusion, chest pain, shortness of breath, abdominal pain or vomiting, he has had no red streaks up his left leg at all and no new weakness or numbness to his left foot, he has baseline partial numbness to the left foot, he has been soaking his foot at home as directed, he still has not decided whether or not he wants a below-the-knee amputation which has been recommended, he does not want admission to the hospital tonight, he just wants a pain shot so he can go home and get some rest and followup with vascular surgery as an outpatient later today. His family agrees with this plan as well. Past Medical History  Diagnosis Date  .  Hypercholesteremia     takes Lipitor daily  . CAD (coronary artery disease)     s/p PTCA to PLV branch in 2002  . Non-insulin dependent diabetes mellitus   . PVD (peripheral vascular disease)     s/p right BKA  . CVA (cerebral infarction)     s/p PTA to middle cerebral artery;slurred speech  . HTN (hypertension)     takes Benazepril daily  . Pacemaker   . Shortness of breath     sitting as well as standing  . Glaucoma   . Complete heart block     s/p Medtr[employer] PPM by JA 09/2009    Past Surgical History  Procedure Date  . Pacemaker insertion 09/2010  . Appendectomy   . Amputation     below amputation  . Cardiac catheterization     2002/2009    Family History  Problem Relation Age of Onset  . Heart failure Mother   . Heart failure Father   . Prostate cancer Brother     History  Substance Use Topics  . Smoking status: Never Smoker   . Smokeless tobacco: Never Used  . Alcohol Use: No      Review of Systems  Constitutional: Negative for fever.       10 Systems reviewed and are negative for acute change except as noted in the HPI.  HENT: Negative for congestion.   Eyes: Negative for discharge and redness.  Respiratory:  Negative for cough and shortness of breath.   Cardiovascular: Negative for chest pain.  Gastrointestinal: Negative for vomiting and abdominal pain.  Musculoskeletal: Negative for back pain.  Skin: Negative for rash.  Neurological: Negative for syncope, numbness and headaches.  Psychiatric/Behavioral:       No behavior change.    Allergies  Review of patient's allergies indicates no known allergies.  Home Medications   Current Outpatient Rx  Name Route Sig Dispense Refill  . ASPIRIN EC 325 MG PO TBEC Oral Take 325 mg by mouth daily.      . ATORVASTATIN CALCIUM 80 MG PO TABS Oral Take 80 mg by mouth daily.    Marland Kitchen BENAZEPRIL HCL 20 MG PO TABS Oral Take 20 mg by mouth daily.      Marland Kitchen CARVEDILOL 12.5 MG PO TABS Oral Take 12.5 mg by mouth 2 (two)  times daily with a meal.    . CLOPIDOGREL BISULFATE 75 MG PO TABS Oral Take 75 mg by mouth daily.    Marland Kitchen SPIRONOLACTONE 25 MG PO TABS Oral Take 12.5 mg by mouth daily.    . AMOXICILLIN-POT CLAVULANATE 875-125 MG PO TABS Oral Take 1 tablet by mouth 2 (two) times daily.    . OXYCODONE-ACETAMINOPHEN 5-325 MG PO TABS Oral Take 2 tablets by mouth every 8 (eight) hours as needed for pain. 20 tablet 0    BP 146/90  Pulse 101  Temp 97.9 F (36.6 C) (Oral)  Resp 18  SpO2 100%  Physical Exam  Nursing note and vitals reviewed. Constitutional: He is oriented to person, place, and time.       Awake, alert, nontoxic appearance.  HENT:  Head: Atraumatic.  Eyes: Right eye exhibits no discharge. Left eye exhibits no discharge.  Neck: Neck supple.  Cardiovascular: Normal rate and regular rhythm.   No murmur heard. Pulmonary/Chest: Effort normal and breath sounds normal. No respiratory distress. He has no wheezes. He has no rales. He exhibits no tenderness.  Abdominal: Soft. There is no tenderness. There is no rebound.  Musculoskeletal: He exhibits tenderness.       Baseline ROM, no obvious new focal weakness. The patient has a right below-the-knee amputation at baseline. His left leg is no tenderness to the thigh or the knee. His left foot has absent pulses and gangrenous black third and fifth toes with dusky great toe and fourth toe with prior amputation of second toe, he has multiple ulcers to the foot with scant odor to the foot but no current purulent drainage excessive warmth or erythema or crepitus, and the patient has baseline decreased light touch and baseline movement of the left foot  Neurological: He is alert and oriented to person, place, and time.       Mental status and motor strength appears baseline for patient and situation.  Skin: No rash noted.  Psychiatric: He has a normal mood and affect.    ED Course  Procedures (including critical care time)   Labs Reviewed  LAB REPORT -  SCANNED   No results found.   1. Gangrene of foot   2. Critical lower limb ischemia       MDM  Pt stable in ED with no significant deterioration in condition.Patient / Family / Caregiver informed of clinical course, understand medical decision-making process, and agree with plan.Pt and family do not want admit and prefer to call Vasc Surg themselves for close f/u since Pt still uncertain about amputation.        Margit Banda  Fonnie Jarvis, MD 07/23/11 1345

## 2011-07-18 NOTE — Progress Notes (Signed)
The patient has progressive gangrene in his left foot this is now extending into the arch of his foot and he is having severe pain. [facility] with uncontrollable pain and was told to present our office today. He is here with his granddaughter and other family member. I did explain that there is no alternative to below-knee amputation. He does have progressive gangrenous changes extending into the arch of his foot. There is no evidence of invasive infection.  Past Medical History  Diagnosis Date  . Complete heart block     s/p PPM by JA 09/2009  . Hypercholesteremia   . HTN (hypertension)   . CAD (coronary artery disease)     s/p PTCA to PLV branch in 2002  . Non-insulin dependent diabetes mellitus   . PVD (peripheral vascular disease)     s/p right BKA  . CVA (cerebral infarction)     s/p PTA to middle cerebral artery    History  Substance Use Topics  . Smoking status: Never Smoker   . Smokeless tobacco: Never Used  . Alcohol Use: No    Family History  Problem Relation Age of Onset  . Heart failure Mother   . Heart failure Father   . Prostate cancer Brother     No Known Allergies  Current outpatient prescriptions:amoxicillin-clavulanate (AUGMENTIN) 875-125 MG per tablet, Take 1 tablet by mouth 2 (two) times daily. One po bid x 7 days, Disp: 14 tablet, Rfl: 0;  aspirin EC 325 MG tablet, Take 325 mg by mouth daily.  , Disp: , Rfl: ;  atorvastatin (LIPITOR) 80 MG tablet, Take 80 mg by mouth daily., Disp: , Rfl: ;  benazepril (LOTENSIN) 20 MG tablet, Take 20 mg by mouth daily.  , Disp: , Rfl:  carvedilol (COREG) 12.5 MG tablet, Take 12.5 mg by mouth 2 (two) times daily with a meal., Disp: , Rfl: ;  cephALEXin (KEFLEX) 500 MG capsule, Take 1 capsule (500 mg total) by mouth 3 (three) times daily., Disp: 42 capsule, Rfl: 1;  clopidogrel (PLAVIX) 75 MG tablet, Take 75 mg by mouth daily., Disp: , Rfl:  oxyCODONE-acetaminophen (PERCOCET) 5-325 MG  per tablet, Take 2 tablets by mouth every 8 (eight) hours as needed for pain., Disp: 20 tablet, Rfl: 0;  spironolactone (ALDACTONE) 25 MG tablet, Take 12.5 mg by mouth daily., Disp: , Rfl: ;  oxyCODONE (OXY IR/ROXICODONE) 5 MG immediate release tablet, Take 1 tablet (5 mg total) by mouth every 8 (eight) hours as needed for pain., Disp: 50 tablet, Rfl: 0 No current facility-administered medications for this visit. Facility-Administered Medications Ordered in Other Visits: HYDROmorphone (DILAUDID) injection 2 mg, 2 mg, Intramuscular, Once, Hurman Horn, MD, 2 mg at 07/18/11 0018;  DISCONTD: 0.9 %  sodium chloride infusion, , Intravenous, Continuous, Remi Haggard, NP;  DISCONTD: HYDROmorphone (DILAUDID) injection 1 mg, 1 mg, Intravenous, Once, Remi Haggard, NP;  DISCONTD: ondansetron (ZOFRAN) injection 4 mg, 4 mg, Intravenous, Once, Remi Haggard, NP  BP 150/84  Pulse 80  Temp 97.7 F (36.5 C) (Oral)  Ht 5\' 9"  (1.753 m)  Wt 175 lb (79.379 kg)  BMI 25.84 kg/m2  SpO2 100%  Body mass index is 25.84 kg/(m^2).       Impression and plan: Progressive pain and now progressive tissue loss in his left foot. The patient is tearful but does agree to amputation. He understands that Dr. Edilia Bo is out of town this week. I've offered surgery tomorrow July 3  or Friday, July 5. He reports that he has been since that he has to attend to [facility] and has agreed to surgery with below-knee amputation on the left on 07/21/2011

## 2011-07-19 ENCOUNTER — Other Ambulatory Visit: Payer: Self-pay

## 2011-07-19 ENCOUNTER — Encounter (HOSPITAL_COMMUNITY): Payer: Self-pay | Admitting: Vascular Surgery

## 2011-07-19 ENCOUNTER — Encounter (HOSPITAL_COMMUNITY): Payer: Self-pay | Admitting: Pharmacy Technician

## 2011-07-19 ENCOUNTER — Encounter (HOSPITAL_COMMUNITY)
Admission: RE | Admit: 2011-07-19 | Discharge: 2011-07-19 | Disposition: A | Discharge status: Home / Self Care | Payer: Medicare Other | Source: Ambulatory Visit | Attending: Vascular Surgery | Admitting: Vascular Surgery

## 2011-07-19 ENCOUNTER — Encounter (HOSPITAL_COMMUNITY)
Admission: RE | Admit: 2011-07-19 | Discharge: 2011-07-19 | Disposition: A | Discharge status: Home / Self Care | Payer: Medicare Other | Source: Ambulatory Visit | Attending: Anesthesiology | Admitting: Anesthesiology

## 2011-07-19 ENCOUNTER — Encounter (HOSPITAL_COMMUNITY): Payer: Self-pay

## 2011-07-19 ENCOUNTER — Other Ambulatory Visit (HOSPITAL_COMMUNITY): Payer: Self-pay | Admitting: *Deleted

## 2011-07-19 HISTORY — DX: Shortness of breath: R06.02

## 2011-07-19 HISTORY — DX: Unspecified glaucoma: H40.9

## 2011-07-19 HISTORY — DX: Presence of cardiac pacemaker: Z95.0

## 2011-07-19 LAB — CBC
HCT: 30.2 % — ABNORMAL LOW (ref 39.0–52.0)
Hemoglobin: 9.8 g/dL — ABNORMAL LOW (ref 13.0–17.0)
MCHC: 32.5 g/dL (ref 30.0–36.0)
MCV: 78.6 fL (ref 78.0–100.0)
RDW: 16.4 % — ABNORMAL HIGH (ref 11.5–15.5)
WBC: 19.6 10*3/uL — ABNORMAL HIGH (ref 4.0–10.5)

## 2011-07-19 LAB — BASIC METABOLIC PANEL
BUN: 27 mg/dL — ABNORMAL HIGH (ref 6–23)
CO2: 22 mEq/L (ref 19–32)
Chloride: 100 mEq/L (ref 96–112)
Creatinine, Ser: 1.01 mg/dL (ref 0.50–1.35)
GFR calc Af Amer: 80 mL/min — ABNORMAL LOW (ref >90)
Potassium: 4.9 mEq/L (ref 3.5–5.1)

## 2011-07-19 LAB — SURGICAL PCR SCREEN: Staphylococcus aureus: NEGATIVE

## 2011-07-19 NOTE — Pre-Procedure Instructions (Signed)
20 [name]  07/19/2011   Your procedure is scheduled on:  Fri, July 5 @ 7:30 AM  Report to [facility] at 5:30 AM.  Call this number if you have problems the morning of surgery: [phone]   Remember:   Do not eat food:After Midnight.  Take these medicines the morning of surgery with A SIP OF WATER: Carvedilol(Coreg) and Pain Pill(if needed)   Do not wear jewelry  Do not wear lotions, powders, or cologne  Men may shave face and neck.  Do not bring valuables to the hospital.  Contacts, dentures or bridgework may not be worn into surgery.  Leave suitcase in the car. After surgery it may be brought to your room.  For patients admitted to the hospital, checkout time is 11:00 AM the day of discharge.   Patients discharged the day of surgery will not be allowed to drive home.  Special Instructions: CHG Shower Use Special Wash: 1/2 bottle night before surgery and 1/2 bottle morning of surgery.   Please read over the following fact sheets that you were given: Pain Booklet, Coughing and Deep Breathing, MRSA Information and Surgical Site Infection Prevention

## 2011-07-19 NOTE — Progress Notes (Signed)
Dr.Allred is cardiologist-last visit a wk ago  Medical MD is Dr.Harwani and sees him this afternoon-requested report  Stress test in epic from 09/12/10  Echo in epic from 2009  Heart cath reports in epic from 2009  EKG in epic from 06/20/11 No recent CXR

## 2011-07-19 NOTE — Consult Note (Signed)
Anesthesia Chart Review:  Patient is a 78 year old male scheduled for a left BKA on 07/21/11 by Dr. Arbie Cookey.  History includes non-smoker, hypercholesterolemia, DM2, CVA s/p PTA to MCA, CAD s/p PTCA PLV branch '02, complete HB s/p Medtronic PPM '11, glaucoma, SOB, HTN, PVD s/p right BKA.  His primary Cardiologist is Dr. Sharyn Lull (seeing today--requested records be faxed to Short Stay).  His EP Cardiologist is Dr. Johney Frame (last visit 09/26/10).   EKG on 06/20/11 showed V-pacing.    Nuclear stress test on 09/12/10 showed: 1. Moderate reversibility involving the apex, suspicious for  inducible ischemia. Possible extension into the inferolateral wall. 2. Global hypokinesis with inferior wall marked hypokinesis to akinesis. 3. Decreased ejection fraction, estimated at 33%.   I spoke with Dr. Sharyn Lull via telephone.  He does have some concerns re: proceeding with surgery as Mr. Balderson refused cardiac cath following his positive stress test in August 2012.  He would like to consider repeating the stress test or proceeding with cardiac cath preoperatively.  I did notify him of patient's leukocytosis.  He is waiting for Dr. Arbie Cookey to call him to discuss the patient further.  I notified Darel Hong at [facility] so she could expedite getting the message to Dr. Arbie Cookey to call Dr. Sharyn Lull.  Currently, his last cardiac cath was from 09/21/09 and showed mild anterolateral wall hypokinesis with EF 45-50%, LM patent, proximal LAD 30%, mid LAD 60-65%, DIAG1 (ostial and mid) 20-30%, DIAG2 very very small and diffusely diseased, LCx  15-20% ostial and 40-50% proximal, OM2/3 very very small and diffusely diseased, RCA 30-40% (mid and ostial), PDA diffusely diseased, PLV branch diffusely diseases with patent PTCA site.    Echo on 09/20/09 showed moderate LVH, diffuse hypokinesis, EF 40-45%, septal motion showed abnormal function and dyssynergy, trivial AR.  CXR report on 07/19/11 showed: The lungs are clear. Moderate cardiomegaly is stable. A  dual  lead permanent pacemaker remains. There is persistent prominence of soft tissues in the superior mediastinum probably due to prominent thyroid but clinical correlation is recommended. This has been present for many years and therefore enlargement of the thyroid is favored. There are degenerative changes throughout the  thoracic spine.  Labs noted.  WBC 19.6, H/H 9.8/30.2, glucose 144, Cr 1.01. Will order a repeat CBC and T&S on arrival. (I called his WBC and CXR results to Oswaldo Done, RN at [facility].)  I've updated Anesthesiologist Dr. Michelle Piper and Dr. Randa Evens regarding Dr. Annitta Jersey concerns.  He is suppose to talk with Dr. Arbie Cookey later today.  If we have not received additional information/recommendations (via fax or Epic) by 07/21/11 then patient's assigned Anesthesiologist will have to follow-up for further details from Dr. Arbie Cookey and/or Dr. Sharyn Lull.    Shonna Chock, PA-C  07/19/11 1513

## 2011-07-21 ENCOUNTER — Encounter (HOSPITAL_COMMUNITY): Admission: RE | Payer: Self-pay | Source: Ambulatory Visit

## 2011-07-21 ENCOUNTER — Other Ambulatory Visit (HOSPITAL_COMMUNITY): Payer: Self-pay | Admitting: Cardiology

## 2011-07-21 ENCOUNTER — Inpatient Hospital Stay (HOSPITAL_COMMUNITY): Admission: RE | Admit: 2011-07-21 | Payer: Medicare Other | Source: Ambulatory Visit | Admitting: Vascular Surgery

## 2011-07-21 DIAGNOSIS — R079 Chest pain, unspecified: Secondary | ICD-10-CM

## 2011-07-21 SURGERY — AMPUTATION BELOW KNEE
Anesthesia: General | Site: Leg Lower | Laterality: Left

## 2011-07-26 ENCOUNTER — Encounter (HOSPITAL_COMMUNITY)
Admission: RE | Admit: 2011-07-26 | Discharge: 2011-07-26 | Disposition: A | Discharge status: Home / Self Care | Payer: Medicare Other | Source: Ambulatory Visit | Attending: Cardiology | Admitting: Cardiology

## 2011-07-26 ENCOUNTER — Other Ambulatory Visit: Payer: Self-pay

## 2011-07-26 DIAGNOSIS — R079 Chest pain, unspecified: Secondary | ICD-10-CM | POA: Insufficient documentation

## 2011-07-26 MED ORDER — REGADENOSON 0.4 MG/5ML IV SOLN: 0.4000 mg | Freq: Once | INTRAVENOUS | Status: DC

## 2011-07-26 MED ORDER — REGADENOSON 0.4 MG/5ML IV SOLN
INTRAVENOUS | Status: AC
  Administered 2011-07-26: 0.4 mg via INTRAVENOUS
  Filled 2011-07-26: qty 5

## 2011-07-26 MED ORDER — TECHNETIUM TC 99M TETROFOSMIN IV KIT
10.0000 | PACK | Freq: Once | INTRAVENOUS | Status: AC | PRN
  Administered 2011-07-26: 10 via INTRAVENOUS

## 2011-07-26 MED ORDER — TECHNETIUM TC 99M TETROFOSMIN IV KIT
30.0000 | PACK | Freq: Once | INTRAVENOUS | Status: AC | PRN
  Administered 2011-07-26: 30 via INTRAVENOUS

## 2011-07-31 ENCOUNTER — Emergency Department (HOSPITAL_COMMUNITY): Payer: Medicare Other

## 2011-07-31 ENCOUNTER — Encounter (HOSPITAL_COMMUNITY): Payer: Self-pay

## 2011-07-31 ENCOUNTER — Inpatient Hospital Stay (HOSPITAL_COMMUNITY)
Admission: EM | Admit: 2011-07-31 | Discharge: 2011-08-09 | DRG: 240 | Disposition: A | Discharge status: Skilled Nursing Facility | Payer: Medicare Other | Attending: Cardiology | Admitting: Cardiology

## 2011-07-31 ENCOUNTER — Telehealth: Payer: Self-pay

## 2011-07-31 DIAGNOSIS — E1159 Type 2 diabetes mellitus with other circulatory complications: Principal | ICD-10-CM | POA: Diagnosis present

## 2011-07-31 DIAGNOSIS — K5900 Constipation, unspecified: Secondary | ICD-10-CM | POA: Diagnosis present

## 2011-07-31 DIAGNOSIS — M869 Osteomyelitis, unspecified: Secondary | ICD-10-CM | POA: Diagnosis present

## 2011-07-31 DIAGNOSIS — I442 Atrioventricular block, complete: Secondary | ICD-10-CM

## 2011-07-31 DIAGNOSIS — Z23 Encounter for immunization: Secondary | ICD-10-CM

## 2011-07-31 DIAGNOSIS — I252 Old myocardial infarction: Secondary | ICD-10-CM

## 2011-07-31 DIAGNOSIS — Z8673 Personal history of transient ischemic attack (TIA), and cerebral infarction without residual deficits: Secondary | ICD-10-CM

## 2011-07-31 DIAGNOSIS — I2510 Atherosclerotic heart disease of native coronary artery without angina pectoris: Secondary | ICD-10-CM | POA: Diagnosis present

## 2011-07-31 DIAGNOSIS — E1169 Type 2 diabetes mellitus with other specified complication: Secondary | ICD-10-CM | POA: Diagnosis present

## 2011-07-31 DIAGNOSIS — Z7902 Long term (current) use of antithrombotics/antiplatelets: Secondary | ICD-10-CM

## 2011-07-31 DIAGNOSIS — L98499 Non-pressure chronic ulcer of skin of other sites with unspecified severity: Secondary | ICD-10-CM

## 2011-07-31 DIAGNOSIS — I509 Heart failure, unspecified: Secondary | ICD-10-CM | POA: Diagnosis present

## 2011-07-31 DIAGNOSIS — I10 Essential (primary) hypertension: Secondary | ICD-10-CM | POA: Diagnosis present

## 2011-07-31 DIAGNOSIS — R739 Hyperglycemia, unspecified: Secondary | ICD-10-CM

## 2011-07-31 DIAGNOSIS — I2589 Other forms of chronic ischemic heart disease: Secondary | ICD-10-CM | POA: Diagnosis present

## 2011-07-31 DIAGNOSIS — I5022 Chronic systolic (congestive) heart failure: Secondary | ICD-10-CM | POA: Diagnosis present

## 2011-07-31 DIAGNOSIS — I70269 Atherosclerosis of native arteries of extremities with gangrene, unspecified extremity: Secondary | ICD-10-CM | POA: Diagnosis present

## 2011-07-31 DIAGNOSIS — Z950 Presence of cardiac pacemaker: Secondary | ICD-10-CM

## 2011-07-31 DIAGNOSIS — E7800 Pure hypercholesterolemia, unspecified: Secondary | ICD-10-CM | POA: Diagnosis present

## 2011-07-31 DIAGNOSIS — M9080 Osteopathy in diseases classified elsewhere, unspecified site: Secondary | ICD-10-CM | POA: Diagnosis present

## 2011-07-31 DIAGNOSIS — S88119A Complete traumatic amputation at level between knee and ankle, unspecified lower leg, initial encounter: Secondary | ICD-10-CM

## 2011-07-31 DIAGNOSIS — H409 Unspecified glaucoma: Secondary | ICD-10-CM | POA: Diagnosis present

## 2011-07-31 DIAGNOSIS — Z79899 Other long term (current) drug therapy: Secondary | ICD-10-CM

## 2011-07-31 DIAGNOSIS — Z7982 Long term (current) use of aspirin: Secondary | ICD-10-CM

## 2011-07-31 DIAGNOSIS — E119 Type 2 diabetes mellitus without complications: Secondary | ICD-10-CM

## 2011-07-31 HISTORY — DX: Cerebral infarction, unspecified: I63.9

## 2011-07-31 LAB — CBC
Hemoglobin: 9.4 g/dL — ABNORMAL LOW (ref 13.0–17.0)
Hemoglobin: 9.5 g/dL — ABNORMAL LOW (ref 13.0–17.0)
MCH: 25.1 pg — ABNORMAL LOW (ref 26.0–34.0)
MCHC: 33.2 g/dL (ref 30.0–36.0)
MCV: 75.5 fL — ABNORMAL LOW (ref 78.0–100.0)
Platelets: 301 10*3/uL (ref 150–400)
RBC: 3.75 MIL/uL — ABNORMAL LOW (ref 4.22–5.81)
RDW: 16.6 % — ABNORMAL HIGH (ref 11.5–15.5)

## 2011-07-31 LAB — GLUCOSE, CAPILLARY
Glucose-Capillary: 129 mg/dL — ABNORMAL HIGH (ref 70–99)
Glucose-Capillary: 136 mg/dL — ABNORMAL HIGH (ref 70–99)

## 2011-07-31 LAB — DIFFERENTIAL
Eosinophils Absolute: 0 10*3/uL (ref 0.0–0.7)
Lymphs Abs: 1 10*3/uL (ref 0.7–4.0)
Monocytes Relative: 2 % — ABNORMAL LOW (ref 3–12)
Neutrophils Relative %: 93 % — ABNORMAL HIGH (ref 43–77)

## 2011-07-31 LAB — COMPREHENSIVE METABOLIC PANEL
ALT: 67 U/L — ABNORMAL HIGH (ref 0–53)
AST: 46 U/L — ABNORMAL HIGH (ref 0–37)
Albumin: 2.3 g/dL — ABNORMAL LOW (ref 3.5–5.2)
Calcium: 9.9 mg/dL (ref 8.4–10.5)
Sodium: 137 mEq/L (ref 135–145)
Total Protein: 7.8 g/dL (ref 6.0–8.3)

## 2011-07-31 LAB — HEMOGLOBIN A1C: Mean Plasma Glucose: 171 mg/dL — ABNORMAL HIGH (ref <117)

## 2011-07-31 LAB — APTT: aPTT: 32 seconds (ref 24–37)

## 2011-07-31 LAB — CREATININE, SERUM
Creatinine, Ser: 1.12 mg/dL (ref 0.50–1.35)
GFR calc non Af Amer: 61 mL/min — ABNORMAL LOW (ref >90)

## 2011-07-31 LAB — POCT I-STAT, CHEM 8
Chloride: 107 mEq/L (ref 96–112)
HCT: 32 % — ABNORMAL LOW (ref 39.0–52.0)
Hemoglobin: 10.9 g/dL — ABNORMAL LOW (ref 13.0–17.0)
Potassium: 5.9 mEq/L — ABNORMAL HIGH (ref 3.5–5.1)
Sodium: 136 mEq/L (ref 135–145)

## 2011-07-31 LAB — PROTIME-INR: INR: 1.42 (ref 0.00–1.49)

## 2011-07-31 MED ORDER — FLEET ENEMA 7-19 GM/118ML RE ENEM
1.0000 | ENEMA | Freq: Once | RECTAL | Status: DC
  Filled 2011-07-31: qty 1

## 2011-07-31 MED ORDER — MORPHINE SULFATE 2 MG/ML IJ SOLN
INTRAMUSCULAR | Status: AC
  Administered 2011-07-31: 2 mg via INTRAVENOUS
  Filled 2011-07-31: qty 1

## 2011-07-31 MED ORDER — ONDANSETRON HCL 4 MG/2ML IJ SOLN
4.0000 mg | Freq: Once | INTRAMUSCULAR | Status: AC
  Administered 2011-07-31: 4 mg via INTRAVENOUS
  Filled 2011-07-31: qty 2

## 2011-07-31 MED ORDER — VANCOMYCIN HCL 1000 MG IV SOLR
1250.0000 mg | INTRAVENOUS | Status: DC
  Administered 2011-07-31 – 2011-08-01 (×2): 1250 mg via INTRAVENOUS
  Filled 2011-07-31 (×3): qty 1250

## 2011-07-31 MED ORDER — ISOSORBIDE MONONITRATE ER 60 MG PO TB24
60.0000 mg | ORAL_TABLET | Freq: Every day | ORAL | Status: DC
  Administered 2011-07-31 – 2011-08-09 (×9): 60 mg via ORAL
  Filled 2011-07-31 (×10): qty 1

## 2011-07-31 MED ORDER — PANTOPRAZOLE SODIUM 40 MG PO TBEC
40.0000 mg | DELAYED_RELEASE_TABLET | Freq: Every day | ORAL | Status: DC
  Administered 2011-08-01 – 2011-08-09 (×8): 40 mg via ORAL
  Filled 2011-07-31 (×8): qty 1

## 2011-07-31 MED ORDER — SODIUM CHLORIDE 0.9 % IJ SOLN
3.0000 mL | Freq: Two times a day (BID) | INTRAMUSCULAR | Status: DC
  Administered 2011-08-01: 3 mL via INTRAVENOUS

## 2011-07-31 MED ORDER — ASPIRIN EC 325 MG PO TBEC
325.0000 mg | DELAYED_RELEASE_TABLET | Freq: Every day | ORAL | Status: DC
  Administered 2011-07-31 – 2011-08-09 (×8): 325 mg via ORAL
  Filled 2011-07-31 (×11): qty 1

## 2011-07-31 MED ORDER — SORBITOL 70 % SOLN
30.0000 mL | Freq: Every day | Status: DC | PRN
  Administered 2011-08-02 – 2011-08-04 (×2): 30 mL via ORAL
  Filled 2011-07-31 (×4): qty 30

## 2011-07-31 MED ORDER — HEPARIN SODIUM (PORCINE) 5000 UNIT/ML IJ SOLN
5000.0000 [IU] | Freq: Three times a day (TID) | INTRAMUSCULAR | Status: AC
  Administered 2011-07-31 – 2011-08-01 (×4): 5000 [IU] via SUBCUTANEOUS
  Filled 2011-07-31 (×5): qty 1

## 2011-07-31 MED ORDER — MORPHINE SULFATE 0.5 MG/ML IJ SOLN: 2.0000 mg | INTRAMUSCULAR | Status: DC | PRN

## 2011-07-31 MED ORDER — CARVEDILOL 12.5 MG PO TABS
12.5000 mg | ORAL_TABLET | Freq: Two times a day (BID) | ORAL | Status: DC
  Administered 2011-07-31 – 2011-08-01 (×2): 12.5 mg via ORAL
  Filled 2011-07-31 (×4): qty 1

## 2011-07-31 MED ORDER — SODIUM CHLORIDE 0.9 % IV SOLN
INTRAVENOUS | Status: DC
  Administered 2011-07-31: 125 mL/h via INTRAVENOUS
  Administered 2011-08-03: 15:00:00 via INTRAVENOUS

## 2011-07-31 MED ORDER — CLOPIDOGREL BISULFATE 75 MG PO TABS
75.0000 mg | ORAL_TABLET | Freq: Every day | ORAL | Status: DC
  Administered 2011-07-31 – 2011-08-01 (×2): 75 mg via ORAL
  Filled 2011-07-31 (×3): qty 1

## 2011-07-31 MED ORDER — MORPHINE SULFATE 4 MG/ML IJ SOLN
4.0000 mg | Freq: Once | INTRAMUSCULAR | Status: AC
  Administered 2011-07-31: 4 mg via INTRAVENOUS
  Filled 2011-07-31: qty 1

## 2011-07-31 MED ORDER — HYDROCODONE-ACETAMINOPHEN 5-325 MG PO TABS
1.0000 | ORAL_TABLET | ORAL | Status: DC | PRN
  Administered 2011-07-31: 2 via ORAL
  Administered 2011-08-01 – 2011-08-03 (×4): 1 via ORAL
  Administered 2011-08-04 – 2011-08-07 (×3): 2 via ORAL
  Filled 2011-07-31: qty 2
  Filled 2011-07-31 (×2): qty 1
  Filled 2011-07-31 (×2): qty 2
  Filled 2011-07-31: qty 1
  Filled 2011-07-31: qty 2
  Filled 2011-07-31: qty 1

## 2011-07-31 MED ORDER — INSULIN ASPART 100 UNIT/ML ~~LOC~~ SOLN
0.0000 [IU] | Freq: Three times a day (TID) | SUBCUTANEOUS | Status: DC
  Administered 2011-08-01: 2 [IU] via SUBCUTANEOUS
  Administered 2011-08-01 – 2011-08-03 (×2): 1 [IU] via SUBCUTANEOUS
  Administered 2011-08-03 – 2011-08-05 (×6): 2 [IU] via SUBCUTANEOUS
  Administered 2011-08-06: 1 [IU] via SUBCUTANEOUS
  Administered 2011-08-06 – 2011-08-07 (×3): 2 [IU] via SUBCUTANEOUS
  Administered 2011-08-07 – 2011-08-08 (×2): 1 [IU] via SUBCUTANEOUS

## 2011-07-31 NOTE — ED Provider Notes (Signed)
History     CSN: [id number]  Arrival date & time 07/31/11  1306   First MD Initiated Contact with Patient 07/31/11 1442      Chief Complaint  Patient presents with  . Constipation    (Consider location/radiation/quality/duration/timing/severity/associated sxs/prior treatment) Patient is a 78 y.o. male presenting with constipation. The history is provided by the patient, the spouse, a relative and medical records.  Constipation  Associated symptoms include abdominal pain. Pertinent negatives include no fever, no nausea and no vomiting.  pt has chronic pain and dm.  He has vasc insufficiency from his dm.   He is scheduled to have a left leg amputation.  His dm is so severe he prob has gastroparesis.  He c/o abd pain and no bm for 3 d.   No n/v.  + hx of appendectomy.  Past Medical History  Diagnosis Date  . Hypercholesteremia     takes Lipitor daily  . CAD (coronary artery disease)     s/p PTCA to PLV branch in 2002  . Non-insulin dependent diabetes mellitus   . PVD (peripheral vascular disease)     s/p right BKA  . CVA (cerebral infarction)     s/p PTA to middle cerebral artery;slurred speech  . HTN (hypertension)     takes Benazepril daily  . Pacemaker   . Shortness of breath     sitting as well as standing  . Glaucoma   . Complete heart block     s/p Medtronic PPM by JA 09/2009  . Stroke     Past Surgical History  Procedure Date  . Pacemaker insertion 09/2010  . Appendectomy   . Amputation     below amputation  . Cardiac catheterization     2002/2009    Family History  Problem Relation Age of Onset  . Heart failure Mother   . Heart failure Father   . Prostate cancer Brother     History  Substance Use Topics  . Smoking status: Never Smoker   . Smokeless tobacco: Never Used  . Alcohol Use: No      Review of Systems  Constitutional: Negative for fever and chills.  Gastrointestinal: Positive for abdominal pain and constipation. Negative for nausea,  vomiting and abdominal distention.  All other systems reviewed and are negative.    Allergies  Review of patient's allergies indicates no known allergies.  Home Medications   Current Outpatient Rx  Name Route Sig Dispense Refill  . ASPIRIN EC 325 MG PO TBEC Oral Take 325 mg by mouth daily.      . ATORVASTATIN CALCIUM 80 MG PO TABS Oral Take 80 mg by mouth daily.    Marland Kitchen CLOPIDOGREL BISULFATE 75 MG PO TABS Oral Take 75 mg by mouth daily.      BP 136/86  Pulse 96  Temp 97.7 F (36.5 C) (Oral)  Resp 16  SpO2 93%  Physical Exam  Nursing note and vitals reviewed. Constitutional: He is oriented to person, place, and time. No distress.       frail  HENT:  Head: Normocephalic and atraumatic.  Eyes: Conjunctivae are normal.  Neck: Normal range of motion. Neck supple.  Cardiovascular: Normal rate.   No murmur heard. Pulmonary/Chest: Effort normal. He has no rales.  Abdominal: Soft. He exhibits no distension. There is no tenderness. There is no rebound and no guarding.  Genitourinary:       Large inspissated stool in rectal vault  Musculoskeletal:       Ulcerated,  black left foot with loss of toes due to ischemia  Neurological: He is alert and oriented to person, place, and time.  Skin: Skin is warm and dry.  Psychiatric: He has a normal mood and affect. Thought content normal.    ED Course  Procedures (including critical care time) Constipation in pt with hx of abd surgery.  Will check lytes to see if metabolic etio for sxs and do xr to check for sbo.    Labs Reviewed  GLUCOSE, CAPILLARY - Abnormal; Notable for the following:    Glucose-Capillary 129 (*)     All other components within normal limits   No results found.   No diagnosis found.    MDM  Constipation Dm         Cheri Guppy, MD 07/31/11 1500

## 2011-07-31 NOTE — ED Notes (Signed)
Pt. Is refusing X3 to  Have his Fleets enema done.   Pt. Also has refused to eat any dinner.  Will continue to monitor.

## 2011-07-31 NOTE — Progress Notes (Signed)
ANTIBIOTIC CONSULT NOTE - INITIAL  Pharmacy Consult for Vancomycin Indication: necrotic left leg  No Known Allergies  Patient Measurements: Height: 5' 8.9" (175 cm) Weight: 168 lb 10.4 oz (76.5 kg) IBW/kg (Calculated) : 70.47   Vital Signs: Temp: 97.7 F (36.5 C) (07/15 1311) Temp src: Oral (07/15 1311) BP: 152/92 mmHg (07/15 1732) Pulse Rate: 91  (07/15 1732) Intake/Output from previous day:   Intake/Output from this shift:    Labs:  Basename 07/31/11 1529  WBC --  HGB 10.9*  PLT --  LABCREA --  CREATININE 1.20   Estimated Creatinine Clearance: 50.6 ml/min (by C-G formula based on Cr of 1.2). No results found for this basename: VANCOTROUGH:2,VANCOPEAK:2,VANCORANDOM:2,GENTTROUGH:2,GENTPEAK:2,GENTRANDOM:2,TOBRATROUGH:2,TOBRAPEAK:2,TOBRARND:2,AMIKACINPEAK:2,AMIKACINTROU:2,AMIKACIN:2, in the last 72 hours   Microbiology: Recent Results (from the past 720 hour(s))  SURGICAL PCR SCREEN     Status: Normal   Collection Time   07/19/11 11:45 AM      Component Value Range Status Comment   MRSA, PCR NEGATIVE  NEGATIVE Final    Staphylococcus aureus NEGATIVE  NEGATIVE Final     Medical History: Past Medical History  Diagnosis Date  . Hypercholesteremia     takes Lipitor daily  . CAD (coronary artery disease)     s/p PTCA to PLV branch in 2002  . Non-insulin dependent diabetes mellitus   . PVD (peripheral vascular disease)     s/p right BKA  . CVA (cerebral infarction)     s/p PTA to middle cerebral artery;slurred speech  . HTN (hypertension)     takes Benazepril daily  . Pacemaker   . Shortness of breath     sitting as well as standing  . Glaucoma   . Complete heart block     s/p Medtronic PPM by JA 09/2009  . Stroke     Medications:   (Not in a hospital admission) Assessment: 78 y/o male patient admitted with constipation, found to have necrotic left leg requiring MRSA coverage for possible infection.  Baseline scr elevated but this was collected from  i-stat. Will check cmet.  Goal of Therapy:  Vancomycin trough level 10-15 mcg/ml  Plan:  Vancomycin 1250mg  IV q24 and will monitor renal function. Measure antibiotic drug levels at steady [address] PharmD, [place] Pager [phone] 07/31/2011,5:39 PM

## 2011-07-31 NOTE — ED Provider Notes (Signed)
Assumed care in CDU.  45 male with constipation.  Hx of abd surgery.  Awaits scan to r/o SBO.    4:23 PM Acute abdomen series shows no evidence of bowel obstruction.  Pt does have elevated K+ of 5.9. Will recheck.  BUN is 36, up from 27 a week ago.  Pt continues to endorse pain to abd and also to R leg.  Request to be admitted.  I have consulted with his PCP Dr. Sharyn Lull, who agrees to see pt in ED and will admit for further management of his necrotic R leg.    Pt will receive pain meds, as well as Fleet enema.  Pt is aware of plan.   Fayrene Helper, PA-C 07/31/11 [phone]

## 2011-07-31 NOTE — Telephone Encounter (Signed)
Call placed to [facility], at Dr. Annitta Jersey office.  Requesting documentation of cardiac clearance following Lexiscan stress test, in order to proceed with scheduling left below knee amputation.  Advised to send request to Dr. Sharyn Lull.

## 2011-07-31 NOTE — ED Notes (Signed)
Per EMS, pt is from home and has not had a BM in 2 days and the only thing he has tried has been vasoline with no luck. Pt is scheduled to have left foot amputated as well but is pending stress test results. 180/100 and HR 90

## 2011-07-31 NOTE — ED Notes (Signed)
Report called to [facility] on 2000 in preparation for transfer.

## 2011-07-31 NOTE — ED Notes (Signed)
Pt's CBG was 129 when I checked it.2:43pm JG.

## 2011-07-31 NOTE — H&P (Signed)
[name] is an 78 y.o. male.   Chief Complaint: Left foot pain/abdominal pain constipation HPI: Patient is 78 year old male with past medical history significant for multiple medical problems i.e. coronary artery disease history of non-Q-wave myocardial infarction in the past ischemic cardiomyopathy history of congestive heart failure history of complete heart block status post permanent pacemaker hypertension, non-insulin-dependent diabetes matters, severe peripheral vascular disease with gangrene of left foot, status post left below-knee amputation in the past, hypercholesteremia, history of CVA and PTA to middle cerebral artery in the past, came to the ER complaining of left foot pain associated with foul smelling discharge associated with chills. Denies any fever. Also complains of vague abdominal pain associated with constipation for last few days. Patient denies any chest pain nausea or vomiting diaphoresis. Denies PND orthopnea states left leg swelling has improved. Patient recently underwent a nuclear stress test which showed global hypokinesia with the no evidence of ischemia ejection fraction of 32%.  Past Medical History  Diagnosis Date  . Hypercholesteremia     takes Lipitor daily  . CAD (coronary artery disease)     s/p PTCA to PLV branch in 2002  . Non-insulin dependent diabetes mellitus   . PVD (peripheral vascular disease)     s/p right BKA  . CVA (cerebral infarction)     s/p PTA to middle cerebral artery;slurred speech  . HTN (hypertension)     takes Benazepril daily  . Pacemaker   . Shortness of breath     sitting as well as standing  . Glaucoma   . Complete heart block     s/p Medtronic PPM by JA 09/2009  . Stroke     Past Surgical History  Procedure Date  . Pacemaker insertion 09/2010  . Appendectomy   . Amputation     below amputation  . Cardiac catheterization     2002/2009    Family History  Problem Relation Age of Onset  . Heart failure Mother   .  Heart failure Father   . Prostate cancer Brother    Social History:  reports that he has never smoked. He has never used smokeless tobacco. He reports that he does not drink alcohol or use illicit drugs.  Allergies: No Known Allergies   (Not in a hospital admission)  Results for orders placed during the hospital encounter of 07/31/11 (from the past 48 hour(s))  GLUCOSE, CAPILLARY     Status: Abnormal   Collection Time   07/31/11  2:41 PM      Component Value Range Comment   Glucose-Capillary 129 (*) 70 - 99 mg/dL    Comment 1 Notify RN     POCT I-STAT, CHEM 8     Status: Abnormal   Collection Time   07/31/11  3:29 PM      Component Value Range Comment   Sodium 136  135 - 145 mEq/L    Potassium 5.9 (*) 3.5 - 5.1 mEq/L    Chloride 107  96 - 112 mEq/L    BUN 36 (*) 6 - 23 mg/dL    Creatinine, Ser 1.61  0.50 - 1.35 mg/dL    Glucose, Bld 096 (*) 70 - 99 mg/dL    Calcium, Ion 0.45  4.09 - 1.30 mmol/L    TCO2 23  0 - 100 mmol/L    Hemoglobin 10.9 (*) 13.0 - 17.0 g/dL    HCT 81.1 (*) 91.4 - 52.0 %   POTASSIUM     Status: Normal   Collection  Time   07/31/11  4:24 PM      Component Value Range Comment   Potassium 5.0  3.5 - 5.1 mEq/L    Dg Abd Acute W/chest  07/31/2011  *RADIOLOGY REPORT*  Clinical Data: Abdominal pain.  Constipation.  High blood pressure.  ACUTE ABDOMEN SERIES (ABDOMEN 2 VIEW & CHEST 1 VIEW)  Comparison: 07/19/2011.  Findings: Sequential pacemaker is in place with leads unchanged in position.  Cardiomegaly.  Central pulmonary vascular prominence.  No segmental infiltrate or gross pneumothorax.  Calcified slightly tortuous aorta.  Prominence of the superior mediastinum may be related to enlarged thyroid gland with slight displacement of the trachea to the right. This is incompletely assessed by plain film exam.  No plain film evidence of bowel obstruction or free intraperitoneal air.  IMPRESSION: No plain film evidence of bowel obstruction or free intraperitoneal air.   Please see above discussion.  Original Report Authenticated By: Fuller Canada, M.D.    Review of Systems  Constitutional: Positive for chills. Negative for fever.  Cardiovascular: Negative for chest pain, palpitations and orthopnea.  Gastrointestinal: Positive for abdominal pain and constipation. Negative for nausea and vomiting.  Genitourinary: Negative for urgency.  Neurological: Negative for dizziness.    Blood pressure 152/92, pulse 94, temperature 97.7 F (36.5 C), temperature source Oral, resp. rate 16, SpO2 100.00%. Physical Exam  Constitutional: He is oriented to person, place, and time.  HENT:  Head: Normocephalic and atraumatic.  Eyes: Conjunctivae are normal. Left eye exhibits no discharge. No scleral icterus.  Neck: Normal range of motion. Neck supple. No JVD present. No thyromegaly present.  Cardiovascular: Normal rate and regular rhythm.  Exam reveals no friction rub.   Murmur (Soft systolic murmur noted no S3 gallop) heard. Respiratory: Effort normal and breath sounds normal. No respiratory distress. He has no wheezes. He has no rales.  GI: Soft. Bowel sounds are normal. He exhibits no distension. There is tenderness (Mild generalized tenderness). There is no rebound and no guarding.  Musculoskeletal:       No clubbing cyanosis 1+ edema left leg with gangrene his toes with foul-smelling discharge. Right below-knee amputation  Neurological: He is alert and oriented to person, place, and time.     Assessment/Plan Gangrenous right foot with osteomyelitis Coronary artery disease history of MI in the past Ischemic cardiomyopathy Compensated systolic heart failure History of complete heart block status post permanent pacemaker Severe peripheral vascular disease history of right below-knee amputation in the past Hypertension Diabetes matters Hypercholesteremia History of CVA and PTA to middle cerebral artery in the past  Abdominal  pain Constipation Hyperkalemia Plan As per orders Vascular surgical consult  Robynn Pane 07/31/2011, 5:31 PM

## 2011-07-31 NOTE — ED Notes (Signed)
Family at bedside. 

## 2011-08-01 LAB — GLUCOSE, CAPILLARY
Glucose-Capillary: 118 mg/dL — ABNORMAL HIGH (ref 70–99)
Glucose-Capillary: 164 mg/dL — ABNORMAL HIGH (ref 70–99)
Glucose-Capillary: 171 mg/dL — ABNORMAL HIGH (ref 70–99)

## 2011-08-01 LAB — BASIC METABOLIC PANEL
Calcium: 9.2 mg/dL (ref 8.4–10.5)
Chloride: 105 mEq/L (ref 96–112)
Creatinine, Ser: 1.03 mg/dL (ref 0.50–1.35)
GFR calc Af Amer: 78 mL/min — ABNORMAL LOW (ref >90)
Sodium: 138 mEq/L (ref 135–145)

## 2011-08-01 LAB — CBC
Platelets: 269 10*3/uL (ref 150–400)
RBC: 3.27 MIL/uL — ABNORMAL LOW (ref 4.22–5.81)
RDW: 16.6 % — ABNORMAL HIGH (ref 11.5–15.5)
WBC: 16.6 10*3/uL — ABNORMAL HIGH (ref 4.0–10.5)

## 2011-08-01 LAB — TSH: TSH: 1.195 u[IU]/mL (ref 0.350–4.500)

## 2011-08-01 MED ORDER — CARVEDILOL 6.25 MG PO TABS
6.2500 mg | ORAL_TABLET | Freq: Two times a day (BID) | ORAL | Status: DC
  Administered 2011-08-01 – 2011-08-09 (×15): 6.25 mg via ORAL
  Filled 2011-08-01 (×18): qty 1

## 2011-08-01 MED ORDER — RAMIPRIL 5 MG PO CAPS
5.0000 mg | ORAL_CAPSULE | Freq: Every day | ORAL | Status: DC
  Administered 2011-08-01 – 2011-08-09 (×7): 5 mg via ORAL
  Filled 2011-08-01 (×9): qty 1

## 2011-08-01 MED ORDER — CEFAZOLIN SODIUM-DEXTROSE 2-3 GM-% IV SOLR
2.0000 g | INTRAVENOUS | Status: AC
  Administered 2011-08-02: 2 g via INTRAVENOUS
  Filled 2011-08-01: qty 50

## 2011-08-01 MED ORDER — BUPIVACAINE HCL (PF) 0.5 % IJ SOLN
INTRAMUSCULAR | Status: AC
  Filled 2011-08-01: qty 30

## 2011-08-01 MED ORDER — HEPARIN SODIUM (PORCINE) 5000 UNIT/ML IJ SOLN
5000.0000 [IU] | Freq: Three times a day (TID) | INTRAMUSCULAR | Status: DC
  Filled 2011-08-01 (×3): qty 1

## 2011-08-01 NOTE — Progress Notes (Signed)
Inpatient Diabetes Program Recommendations  AACE/ADA: New Consensus Statement on Inpatient Glycemic Control (2009)  Target Ranges:  Prepandial:   less than 140 mg/dL      Peak postprandial:   less than 180 mg/dL (1-2 hours)      Critically ill patients:  140 - 180 mg/dL   Reason for Visit: Note patient admitted with gangrene of left foot.  A1C=7.6% indicating CBG's greater than goal prior to admit.  He has history of diabetes however no diabetes medications listed. May consider adding oral agent such as Tradgenta 5 mg daily (lower risk for hypoglycemia) once patient is eating.  Will follow.

## 2011-08-01 NOTE — Care Management Note (Unsigned)
    Page 1 of 1   08/01/2011     11:48:38 AM   CARE MANAGEMENT NOTE 08/01/2011  Patient:  [name]   Account Number:  [id number]  Date Initiated:  08/01/2011  Documentation initiated by:  SIMMONS,Dhana Totton  Subjective/Objective Assessment:   ADMITTED WITH GANGRENE LEFT FOOT; LIVES AT HOME WITH WIFE; AMBULATED WITH RW AND LEG PROSTHESIS PTA.     Action/Plan:   DISCHARGE PLANNING DISCUSSED AT BEDSIDE.   Anticipated DC Date:  08/03/2011   Anticipated DC Plan:  HOME W HOME HEALTH SERVICES      DC Planning Services  CM consult      Choice offered to / List presented to:             Status of service:  In process, will continue to follow Medicare Important Message given?   (If response is "NO", the following Medicare IM given date fields will be blank) Date Medicare IM given:   Date Additional Medicare IM given:    Discharge Disposition:    Per UR Regulation:  Reviewed for med. necessity/level of care/duration of stay  If discussed at Long Length of Stay Meetings, dates discussed:    Comments:  08/01/11  1147  Yusef Lamp SIMMONS RN, BSN [phone] NCM WILL FOLLOW.

## 2011-08-01 NOTE — Consult Note (Signed)
WOC consult Note Reason for Consult: Consult requested for left foot wound.  Pt has gangrene and osteomyelitis according to progress notes and was previously scheduled to have a left foot amputation  earlier in July by [facility] which was cancelled. Wound type: Necrotic full thickness wound to left foot. Measurement: 100% dry eschar to toes, entire plantar area of foot with large amt tan, very foul smelling drainage.  Patchy areas of white macerated skin interspersed with fluctuant scabbed areas which have drainage.  Entire foot very painful.  This is beyond [facility] scope of practice.  PLEASE REFER TO [facility] TEAM FOR FURTHER PLAN OF CARE. Dressing procedure/placement/frequency: Dry gauze to protect from further injury and absorb drainage until further orders available from [facility] team. Will not plan to follow further unless re-consulted.  [address] RN, MSN, [employer]  [phone]

## 2011-08-01 NOTE — Progress Notes (Signed)
Subjective:  Patient denies any chest pain or shortness of breath. States her left foot pain has improved. Scheduled for left below-knee amputation tomorrow morning  Objective:  Vital Signs in the last 24 hours: Temp:  [97.3 F (36.3 C)-97.9 F (36.6 C)] 97.3 F (36.3 C) (07/16 0610) Pulse Rate:  [62-96] 62  (07/16 0610) Resp:  [16-23] 20  (07/16 0610) BP: (123-155)/(72-92) 123/72 mmHg (07/16 0610) SpO2:  [92 %-100 %] 92 % (07/16 0610) Weight:  [71 kg (156 lb 8.4 oz)-76.5 kg (168 lb 10.4 oz)] 72 kg (158 lb 11.7 oz) (07/16 0610)  Intake/Output from previous day: 07/15 0701 - 07/16 0700 In: -  Out: 475 [Urine:475] Intake/Output from this shift:    Physical Exam: Neck: no adenopathy, no carotid bruit, no JVD and supple, symmetrical, trachea midline Lungs: clear to auscultation bilaterally Heart: regular rate and rhythm, S1, S2 normal and Soft systolic murmur noted no S3 gallop Abdomen: soft, non-tender; bowel sounds normal; no masses,  no organomegaly Extremities: No clubbing cyanosis or edema. Right leg below-knee amputation left foot and gangrene is changes with foul-smelling drainage  Lab Results:  Basename 08/01/11 0525 07/31/11 1914  WBC 16.6* 20.9*  HGB 8.2* 9.5*  PLT 269 301    Basename 08/01/11 0525 07/31/11 1914 07/31/11 1737  NA 138 -- 137  K 4.7 -- 5.6*  CL 105 -- 100  CO2 25 -- 24  GLUCOSE 125* -- 169*  BUN 32* -- 36*  CREATININE 1.03 1.12 --   No results found for this basename: TROPONINI:2,CK,MB:2 in the last 72 hours Hepatic Function Panel  Basename 07/31/11 1737  PROT 7.8  ALBUMIN 2.3*  AST 46*  ALT 67*  ALKPHOS 218*  BILITOT 1.0  BILIDIR --  IBILI --   No results found for this basename: CHOL in the last 72 hours No results found for this basename: PROTIME in the last 72 hours  Imaging: Imaging results have been reviewed and Dg Abd Acute W/chest  07/31/2011  *RADIOLOGY REPORT*  Clinical Data: Abdominal pain.  Constipation.  High blood  pressure.  ACUTE ABDOMEN SERIES (ABDOMEN 2 VIEW & CHEST 1 VIEW)  Comparison: 07/19/2011.  Findings: Sequential pacemaker is in place with leads unchanged in position.  Cardiomegaly.  Central pulmonary vascular prominence.  No segmental infiltrate or gross pneumothorax.  Calcified slightly tortuous aorta.  Prominence of the superior mediastinum may be related to enlarged thyroid gland with slight displacement of the trachea to the right. This is incompletely assessed by plain film exam.  No plain film evidence of bowel obstruction or free intraperitoneal air.  IMPRESSION: No plain film evidence of bowel obstruction or free intraperitoneal air.  Please see above discussion.  Original Report Authenticated By: Fuller Canada, M.D.    Cardiac Studies:  Assessment/Plan:  Gangrenous right foot with osteomyelitis  Coronary artery disease history of MI in the past  Ischemic cardiomyopathy  Compensated systolic heart failure  History of complete heart block status post permanent pacemaker  Severe peripheral vascular disease history of right below-knee amputation in the past  Hypertension  Diabetes matters  Hypercholesteremia  History of CVA and PTA to middle cerebral artery in the past  Plan Continue present management Patient schedule for left BKA tomorrow   LOS: 1 day    Perrin Gens N 08/01/2011, 10:26 AM

## 2011-08-01 NOTE — Progress Notes (Signed)
VASCULAR PROGRESS NOTE  SUBJECTIVE: Notified of patient's admission. Patient now agreeable to proceed with left below the knee amputation.  PHYSICAL EXAM: Filed Vitals:   07/31/11 1732 07/31/11 1839 07/31/11 2045 08/01/11 0610  BP: 152/92 155/88 140/85 123/72  Pulse: 91 94 90 62  Temp:  97.6 F (36.4 C) 97.9 F (36.6 C) 97.3 F (36.3 C)  TempSrc:  Oral Oral Oral  Resp: 23 22 20 20  Height: 5' 8.9" (1.75 m) 5' 8" (1.727 m)    Weight: 168 lb 10.4 oz (76.5 kg) 156 lb 8.4 oz (71 kg)  158 lb 11.7 oz (72 kg)  SpO2: 100% 100% 99% 92%   Extensive wound of the left foot with odor noted.  LABS: Lab Results  Component Value Date   WBC 16.6* 08/01/2011   HGB 8.2* 08/01/2011   HCT 24.9* 08/01/2011   MCV 76.1* 08/01/2011   PLT 269 08/01/2011   Lab Results  Component Value Date   CREATININE 1.03 08/01/2011   Lab Results  Component Value Date   INR 1.42 07/31/2011   CBG (last 3)   Basename 08/01/11 1148 08/01/11 0619 07/31/11 1812  GLUCAP 164* 118* 136*     ASSESSMENT/PLAN: 1. This is a patient who I had previously seen with critical lower limb ischemia. He has gangrene of the left foot. He has undergone an arteriogram which demonstrates that his left femoral to above-knee popliteal artery bypass graft is patent, however he has severe tibial artery occlusive disease with no options for revascularization. I previously discussed with him below the knee amputation however he was initially reluctant to proceed. we had also discussed the option of above-the-knee amputation. He later was agreeable for left below the knee amputation was scheduled to have surgery by Dr. Todd Early. However he underwent preoperative cardiac evaluation and surgery was delayed. He is admitted now with left leg pain and is now agreeable to proceed with amputation. I have discussed with him the 20% risk of nonhealing a below-the-knee amputation. I've explained that almost always an above-the-knee amputation will heal. Also  explained it would be very difficult for him to use bilateral prostheses. He does have a prosthesis for his right BKA. I've explained that if he would seriously motivated to try to become ambulatory with bilateral prostheses it would be reasonable to attempt below the knee amputation which would increase his chances of success. However, again this would be associated with a 20% risk of nonhealing. He would like to proceed with left below-knee amputation. This is scheduled for Dr. Early tomorrow. He is currently on intravenous antibiotics. We will hold his SQ heparin in the morning. He is on Plavix which does slightly increase his risk of postoperative bleeding.  Sameerah Nachtigal, MD, FACS Beeper: [phone] 08/01/2011    

## 2011-08-01 NOTE — ED Provider Notes (Signed)
I was available during this patient's CDU course for consult  Gerhard Munch, MD 08/01/11 0005

## 2011-08-01 NOTE — Clinical Documentation Improvement (Signed)
DIABETIC  DOCUMENTATION CLARIFICATION QUERY  THIS DOCUMENT IS NOT A PERMANENT PART OF THE MEDICAL RECORD  TO RESPOND TO THE THIS QUERY, FOLLOW THE INSTRUCTIONS BELOW:  1. If needed, update documentation for the patient's encounter via the notes activity.  2. Access this query again and click edit on the In [employer].  3. After updating, or not, click F2 to complete all highlighted (required) fields concerning your review. Select "additional documentation in the medical record" OR "no additional documentation provided".  4. Click Sign note button.  5. The deficiency will fall out of your In Basket *Please let us know if you are not able to complete this workflow by phone or e-mail (listed below).  Please update your documentation within the medical record to reflect your response to this query.                                                                                        08/01/11   Dear Dr.Harwani/Associates,  In a better effort to capture your patient's severity of illness, reflect appropriate length of stay and utilization of resources, a review of the patient medical record has revealed the following indicators.    Based on your clinical judgment, please clarify and document in a progress note and/or discharge summary the clinical condition associated with the following supporting information:  In responding to this query please exercise your independent judgment.  The fact that a query is asked, does not imply that any particular answer is desired or expected.  Please clarify and specify Diabetes type, control, manifestations, and associated conditions.  Possible Clinical Conditions?   _______Diabetes Type  1 or 2 _______Controlled or uncontrolled  Manifestations:  _______DM retinopathy  _______DM PVD _______DM neuropathy   _______DM nephropathy  Associated conditions: _______DM cellulitis _______DM gangrene _______DM gastroparesis _______DM  osteomyelitis _______DM skin ulcer  _______Other Condition _______Cannot Clinically determine     Supporting Information:  Risk Factors: Noted NIDDM per 7/15 progress notes.  Diagnostics: Lab:   7/15:   Hgb A1c= 7.6 Mean plasma glucose:  171 Glucose, blood:  185   Treatment: 7/16:  Sliding scale insulin ordered 3x/day.  You may use possible, probable, or suspect with inpatient documentation. possible, probable, suspected diagnoses MUST be documented at the time of discharge  Reviewed: No additional documentation provided.                                                      Thank You,  Marciano Sequin,  Clinical Documentation Specialist:  Pager: [phone]  Health Information Management [place]

## 2011-08-01 NOTE — Telephone Encounter (Signed)
Rec'd phone call from pt's sister, Thomasenia Sales, stating pt. is hospitalized due to uncontrolled pain in left leg.  States that his left leg amputation is being decided upon as to when to proceed.

## 2011-08-02 ENCOUNTER — Encounter (HOSPITAL_COMMUNITY): Payer: Self-pay | Admitting: Anesthesiology

## 2011-08-02 ENCOUNTER — Ambulatory Visit: Payer: Self-pay | Admitting: Vascular Surgery

## 2011-08-02 ENCOUNTER — Encounter (HOSPITAL_COMMUNITY)
Admission: EM | Disposition: A | Discharge status: Skilled Nursing Facility | Payer: Self-pay | Source: Home / Self Care | Attending: Cardiology

## 2011-08-02 ENCOUNTER — Inpatient Hospital Stay (HOSPITAL_COMMUNITY): Payer: Medicare Other | Admitting: Anesthesiology

## 2011-08-02 DIAGNOSIS — I70269 Atherosclerosis of native arteries of extremities with gangrene, unspecified extremity: Secondary | ICD-10-CM

## 2011-08-02 HISTORY — PX: AMPUTATION: SHX166

## 2011-08-02 LAB — CBC
HCT: 26.4 % — ABNORMAL LOW (ref 39.0–52.0)
Hemoglobin: 8.7 g/dL — ABNORMAL LOW (ref 13.0–17.0)
MCH: 25.2 pg — ABNORMAL LOW (ref 26.0–34.0)
MCHC: 33 g/dL (ref 30.0–36.0)
MCV: 76.5 fL — ABNORMAL LOW (ref 78.0–100.0)

## 2011-08-02 LAB — BASIC METABOLIC PANEL
BUN: 27 mg/dL — ABNORMAL HIGH (ref 6–23)
CO2: 25 mEq/L (ref 19–32)
Chloride: 105 mEq/L (ref 96–112)
GFR calc Af Amer: >90 mL/min (ref >90)
Glucose, Bld: 128 mg/dL — ABNORMAL HIGH (ref 70–99)
Potassium: 4.6 mEq/L (ref 3.5–5.1)

## 2011-08-02 LAB — GLUCOSE, CAPILLARY
Glucose-Capillary: 126 mg/dL — ABNORMAL HIGH (ref 70–99)
Glucose-Capillary: 135 mg/dL — ABNORMAL HIGH (ref 70–99)

## 2011-08-02 SURGERY — AMPUTATION BELOW KNEE
Anesthesia: General | Site: Leg Lower | Laterality: Left | Wound class: Clean

## 2011-08-02 MED ORDER — FENTANYL CITRATE 0.05 MG/ML IJ SOLN: INTRAMUSCULAR | Status: DC | PRN

## 2011-08-02 MED ORDER — DEXTROSE 5 % IV SOLN
INTRAVENOUS | Status: DC | PRN
  Administered 2011-08-02: 12:00:00 via INTRAVENOUS

## 2011-08-02 MED ORDER — SODIUM CHLORIDE 0.9 % IV SOLN: 250.0000 mL | INTRAVENOUS | Status: DC | PRN

## 2011-08-02 MED ORDER — NEOSTIGMINE METHYLSULFATE 1 MG/ML IJ SOLN
INTRAMUSCULAR | Status: DC | PRN
  Administered 2011-08-02: 3.5 mg via INTRAVENOUS

## 2011-08-02 MED ORDER — LACTATED RINGERS IV SOLN: INTRAVENOUS | Status: DC | PRN

## 2011-08-02 MED ORDER — LABETALOL HCL 5 MG/ML IV SOLN
10.0000 mg | INTRAVENOUS | Status: DC | PRN
  Filled 2011-08-02: qty 4

## 2011-08-02 MED ORDER — POTASSIUM CHLORIDE CRYS ER 20 MEQ PO TBCR: 20.0000 meq | EXTENDED_RELEASE_TABLET | Freq: Once | ORAL | Status: AC | PRN

## 2011-08-02 MED ORDER — ACETAMINOPHEN 650 MG RE SUPP: 325.0000 mg | RECTAL | Status: DC | PRN

## 2011-08-02 MED ORDER — METOPROLOL TARTRATE 1 MG/ML IV SOLN: 2.0000 mg | INTRAVENOUS | Status: DC | PRN

## 2011-08-02 MED ORDER — CEFUROXIME SODIUM 1.5 G IJ SOLR: 1.5000 g | INTRAMUSCULAR | Status: DC | PRN

## 2011-08-02 MED ORDER — LACTATED RINGERS IV SOLN
INTRAVENOUS | Status: DC
  Administered 2011-08-02 (×2): via INTRAVENOUS

## 2011-08-02 MED ORDER — ONDANSETRON HCL 4 MG/2ML IJ SOLN: 4.0000 mg | Freq: Once | INTRAMUSCULAR | Status: DC | PRN

## 2011-08-02 MED ORDER — FENTANYL CITRATE 0.05 MG/ML IJ SOLN
INTRAMUSCULAR | Status: DC | PRN
  Administered 2011-08-02: 50 ug via INTRAVENOUS
  Administered 2011-08-02: 100 ug via INTRAVENOUS

## 2011-08-02 MED ORDER — BISACODYL 10 MG RE SUPP
10.0000 mg | Freq: Every day | RECTAL | Status: DC | PRN
  Administered 2011-08-04: 10 mg via RECTAL
  Filled 2011-08-02: qty 1

## 2011-08-02 MED ORDER — ALUM & MAG HYDROXIDE-SIMETH 200-200-20 MG/5ML PO SUSP: 15.0000 mL | ORAL | Status: DC | PRN

## 2011-08-02 MED ORDER — SODIUM CHLORIDE 0.9 % IJ SOLN
3.0000 mL | Freq: Two times a day (BID) | INTRAMUSCULAR | Status: DC
  Administered 2011-08-04 – 2011-08-09 (×5): 3 mL via INTRAVENOUS

## 2011-08-02 MED ORDER — POLYETHYLENE GLYCOL 3350 17 G PO PACK
17.0000 g | PACK | Freq: Every day | ORAL | Status: DC | PRN
  Administered 2011-08-05 – 2011-08-06 (×2): 17 g via ORAL
  Filled 2011-08-02 (×3): qty 1

## 2011-08-02 MED ORDER — LIDOCAINE HCL (CARDIAC) 20 MG/ML IV SOLN
INTRAVENOUS | Status: DC | PRN
  Administered 2011-08-02: 30 mg via INTRAVENOUS

## 2011-08-02 MED ORDER — HETASTARCH-ELECTROLYTES 6 % IV SOLN
INTRAVENOUS | Status: DC | PRN
  Administered 2011-08-02: 13:00:00 via INTRAVENOUS

## 2011-08-02 MED ORDER — VANCOMYCIN HCL IN DEXTROSE 1-5 GM/200ML-% IV SOLN
1000.0000 mg | Freq: Two times a day (BID) | INTRAVENOUS | Status: DC
  Administered 2011-08-02 – 2011-08-04 (×5): 1000 mg via INTRAVENOUS
  Filled 2011-08-02 (×7): qty 200

## 2011-08-02 MED ORDER — CLOPIDOGREL BISULFATE 75 MG PO TABS
75.0000 mg | ORAL_TABLET | Freq: Every day | ORAL | Status: DC
  Administered 2011-08-03 – 2011-08-04 (×2): 75 mg via ORAL
  Filled 2011-08-02 (×3): qty 1

## 2011-08-02 MED ORDER — SODIUM CHLORIDE 0.9 % IJ SOLN: 3.0000 mL | INTRAMUSCULAR | Status: DC | PRN

## 2011-08-02 MED ORDER — GLYCOPYRROLATE 0.2 MG/ML IJ SOLN
INTRAMUSCULAR | Status: DC | PRN
  Administered 2011-08-02: .5 mg via INTRAVENOUS

## 2011-08-02 MED ORDER — HYDRALAZINE HCL 20 MG/ML IJ SOLN
10.0000 mg | INTRAMUSCULAR | Status: DC | PRN
  Filled 2011-08-02: qty 0.5

## 2011-08-02 MED ORDER — LACTATED RINGERS IV SOLN
INTRAVENOUS | Status: DC | PRN
  Administered 2011-08-02: 11:00:00 via INTRAVENOUS

## 2011-08-02 MED ORDER — ONDANSETRON HCL 4 MG/2ML IJ SOLN
INTRAMUSCULAR | Status: DC | PRN
  Administered 2011-08-02: 4 mg via INTRAVENOUS

## 2011-08-02 MED ORDER — ACETAMINOPHEN 10 MG/ML IV SOLN: 1000.0000 mg | Freq: Once | INTRAVENOUS | Status: DC | PRN

## 2011-08-02 MED ORDER — EPHEDRINE SULFATE 50 MG/ML IJ SOLN
INTRAMUSCULAR | Status: DC | PRN
  Administered 2011-08-02 (×2): 5 mg via INTRAVENOUS
  Administered 2011-08-02: 10 mg via INTRAVENOUS

## 2011-08-02 MED ORDER — ACETAMINOPHEN 325 MG PO TABS
325.0000 mg | ORAL_TABLET | ORAL | Status: DC | PRN
  Filled 2011-08-02: qty 2

## 2011-08-02 MED ORDER — HYDROMORPHONE HCL PF 1 MG/ML IJ SOLN: 0.2500 mg | INTRAMUSCULAR | Status: DC | PRN

## 2011-08-02 MED ORDER — SODIUM CHLORIDE 0.9 % IV SOLN
INTRAVENOUS | Status: DC | PRN
  Administered 2011-08-02: 13:00:00 via INTRAVENOUS

## 2011-08-02 MED ORDER — PHENOL 1.4 % MT LIQD
1.0000 | OROMUCOSAL | Status: DC | PRN
  Filled 2011-08-02: qty 177

## 2011-08-02 MED ORDER — PROPOFOL 10 MG/ML IV EMUL
INTRAVENOUS | Status: DC | PRN
  Administered 2011-08-02: 70 mg via INTRAVENOUS

## 2011-08-02 MED ORDER — MORPHINE SULFATE 2 MG/ML IJ SOLN
2.0000 mg | INTRAMUSCULAR | Status: DC | PRN
  Administered 2011-08-02: 4 mg via INTRAVENOUS
  Filled 2011-08-02: qty 2

## 2011-08-02 MED ORDER — MORPHINE SULFATE 2 MG/ML IJ SOLN
2.0000 mg | INTRAMUSCULAR | Status: DC | PRN
  Administered 2011-08-02 (×2): 2 mg via INTRAVENOUS
  Filled 2011-08-02 (×2): qty 1

## 2011-08-02 MED ORDER — ONDANSETRON HCL 4 MG/2ML IJ SOLN: 4.0000 mg | Freq: Four times a day (QID) | INTRAMUSCULAR | Status: DC | PRN

## 2011-08-02 MED ORDER — 0.9 % SODIUM CHLORIDE (POUR BTL) OPTIME
TOPICAL | Status: DC | PRN
  Administered 2011-08-02: 1000 mL

## 2011-08-02 MED ORDER — DOCUSATE SODIUM 100 MG PO CAPS
100.0000 mg | ORAL_CAPSULE | Freq: Every day | ORAL | Status: DC
  Administered 2011-08-03 – 2011-08-09 (×6): 100 mg via ORAL
  Filled 2011-08-02 (×7): qty 1

## 2011-08-02 MED ORDER — ROCURONIUM BROMIDE 100 MG/10ML IV SOLN
INTRAVENOUS | Status: DC | PRN
  Administered 2011-08-02: 25 mg via INTRAVENOUS

## 2011-08-02 MED ORDER — MIDAZOLAM HCL 5 MG/5ML IJ SOLN: INTRAMUSCULAR | Status: DC | PRN

## 2011-08-02 SURGICAL SUPPLY — 49 items
BANDAGE ELASTIC 4 VELCRO ST LF (GAUZE/BANDAGES/DRESSINGS) ×2 IMPLANT
BANDAGE ELASTIC 6 VELCRO ST LF (GAUZE/BANDAGES/DRESSINGS) IMPLANT
BANDAGE ESMARK 6X9 LF (GAUZE/BANDAGES/DRESSINGS) IMPLANT
BANDAGE GAUZE ELAST BULKY 4 IN (GAUZE/BANDAGES/DRESSINGS) IMPLANT
BNDG CMPR 9X6 STRL LF SNTH (GAUZE/BANDAGES/DRESSINGS)
BNDG ESMARK 6X9 LF (GAUZE/BANDAGES/DRESSINGS)
CANISTER SUCTION 2500CC (MISCELLANEOUS) ×2 IMPLANT
CLIP LIGATING EXTRA MED SLVR (CLIP) ×2 IMPLANT
CLIP LIGATING EXTRA SM BLUE (MISCELLANEOUS) ×2 IMPLANT
CLOTH BEACON ORANGE TIMEOUT ST (SAFETY) ×2 IMPLANT
COVER SURGICAL LIGHT HANDLE (MISCELLANEOUS) ×2 IMPLANT
CUFF TOURNIQUET SINGLE 34IN LL (TOURNIQUET CUFF) IMPLANT
CUFF TOURNIQUET SINGLE 44IN (TOURNIQUET CUFF) IMPLANT
DRAIN SNY 10X20 3/4 PERF (WOUND CARE) IMPLANT
DRAPE ORTHO SPLIT 77X108 STRL (DRAPES) ×4
DRAPE PROXIMA HALF (DRAPES) ×2 IMPLANT
DRAPE SURG ORHT 6 SPLT 77X108 (DRAPES) ×2 IMPLANT
ELECT REM PT RETURN 9FT ADLT (ELECTROSURGICAL) ×2
ELECTRODE REM PT RTRN 9FT ADLT (ELECTROSURGICAL) ×1 IMPLANT
EVACUATOR SILICONE 100CC (DRAIN) IMPLANT
GAUZE XEROFORM 5X9 LF (GAUZE/BANDAGES/DRESSINGS) ×2 IMPLANT
GLOVE BIO SURGEON STRL SZ 6.5 (GLOVE) ×1 IMPLANT
GLOVE BIOGEL PI IND STRL 6.5 (GLOVE) IMPLANT
GLOVE BIOGEL PI IND STRL 7.0 (GLOVE) IMPLANT
GLOVE BIOGEL PI INDICATOR 6.5 (GLOVE) ×2
GLOVE BIOGEL PI INDICATOR 7.0 (GLOVE) ×1
GLOVE ECLIPSE 6.5 STRL STRAW (GLOVE) ×1 IMPLANT
GLOVE SS BIOGEL STRL SZ 7.5 (GLOVE) ×1 IMPLANT
GLOVE SUPERSENSE BIOGEL SZ 7.5 (GLOVE) ×1
GOWN STRL NON-REIN LRG LVL3 (GOWN DISPOSABLE) ×6 IMPLANT
KIT BASIN OR (CUSTOM PROCEDURE TRAY) ×2 IMPLANT
KIT ROOM TURNOVER OR (KITS) ×2 IMPLANT
NS IRRIG 1000ML POUR BTL (IV SOLUTION) ×2 IMPLANT
PACK GENERAL/GYN (CUSTOM PROCEDURE TRAY) ×2 IMPLANT
PAD ARMBOARD 7.5X6 YLW CONV (MISCELLANEOUS) ×4 IMPLANT
PADDING CAST COTTON 6X4 STRL (CAST SUPPLIES) IMPLANT
SAW GIGLI STERILE 20 (MISCELLANEOUS) ×2 IMPLANT
SPONGE GAUZE 4X4 12PLY (GAUZE/BANDAGES/DRESSINGS) ×2 IMPLANT
STAPLER VISISTAT 35W (STAPLE) ×2 IMPLANT
STOCKINETTE IMPERVIOUS LG (DRAPES) ×2 IMPLANT
SUT ETHILON 3 0 PS 1 (SUTURE) IMPLANT
SUT VIC AB 0 CT1 18XCR BRD 8 (SUTURE) ×2 IMPLANT
SUT VIC AB 0 CT1 8-18 (SUTURE) ×4
SUT VICRYL 2 0 18  TIES (SUTURE)
SUT VICRYL 2 0 18 TIES (SUTURE) ×1 IMPLANT
TOWEL OR 17X24 6PK STRL BLUE (TOWEL DISPOSABLE) ×2 IMPLANT
TOWEL OR 17X26 10 PK STRL BLUE (TOWEL DISPOSABLE) ×2 IMPLANT
UNDERPAD 30X30 INCONTINENT (UNDERPADS AND DIAPERS) ×2 IMPLANT
WATER STERILE IRR 1000ML POUR (IV SOLUTION) ×2 IMPLANT

## 2011-08-02 NOTE — Op Note (Signed)
OPERATIVE REPORT  DATE OF SURGERY: 08/02/2011  PATIENT: [name], 78 y.o. male MRN: [id number]  DOB: 1931/08/26  PRE-OPERATIVE DIAGNOSIS: Gangrene left foot  POST-OPERATIVE DIAGNOSIS:  Same  PROCEDURE: Left below knee amputation  SURGEON:  Gretta Began, M.D.  PHYSICIAN ASSISTANT: Roczniak  ANESTHESIA:  Gen.  EBL: 75 ml  Total I/O In: 1800 [I.V.:1550; IV Piggyback:250] Out: 75 [Blood:75]  BLOOD ADMINISTERED: None  DRAINS: None  SPECIMEN: Left below-knee amputation specimen  COUNTS CORRECT:  YES  PLAN OF CARE: PACU   PATIENT DISPOSITION:  PACU - hemodynamically stable  PROCEDURE DETAILS: The patient was taken to the operating room and placed in supine position where the area of the left leg was prepped and draped in usual sterile fashion incision was made through the skin and carried down to the fascia with electrocautery. A posterior based muscle flap was utilized. The muscle bodies although very viable with good blood flow to the muscles themselves. The anterior tibial neurovascular bundle were ligated with 0 Vicryl ties and divided. Periosteum was elevated off the fibula and this was divided with a bone shear. The soleus muscle was divided in line with the anterior incision. The gastrocnemius was left intact with the posterior based muscle flap. Popliteal artery was ligated and divided as was the popliteal vein. Periosteum was elevated off the tibia and this was divided with a Gigli saw. The specimen was passed off the field. The bone edges were smoothed with a bone rasp. The wounds were irrigated with saline and hemostasis was obtained with electrocautery. The posterior fascia was brought up to the anterior fascia and was closed to the anterior fascia with interrupted 0 Vicryl figure-of-eight sutures. Skin staples were used for skin closure. Sterile dressing was applied an Ace wrap was applied over this and the patient was taken to the recovery room in stable  condition   Gretta Began, M.D. 08/02/2011 3:38 PM

## 2011-08-02 NOTE — Preoperative (Signed)
Beta Blockers   Reason not to administer Beta Blockers:Not Applicable, taken this am per pt 

## 2011-08-02 NOTE — Transfer of Care (Signed)
Immediate Anesthesia Transfer of Care Note  Patient: [name]  Procedure(s) Performed: Procedure(s) (LRB): AMPUTATION BELOW KNEE (Left)  Patient Location: PACU  Anesthesia Type: General  Level of Consciousness: awake and alert   Airway & Oxygen Therapy: Patient Spontanous Breathing and Patient connected to nasal cannula oxygen  Post-op Assessment: Report given to PACU RN and Post -op Vital signs reviewed and stable  Post vital signs: Reviewed and stable  Complications: No apparent anesthesia complications

## 2011-08-02 NOTE — Interval H&P Note (Signed)
History and Physical Interval Note:  08/02/2011 12:06 PM  [name]  has presented today for surgery, with the diagnosis of PVD with gangrene  The various methods of treatment have been discussed with the patient and family. After consideration of risks, benefits and other options for treatment, the patient has consented to  Procedure(s) (LRB): AMPUTATION BELOW KNEE (Left) as a surgical intervention .  The patient's history has been reviewed, patient examined, no change in status, stable for surgery.  I have reviewed the patients' chart and labs.  Questions were answered to the patient's satisfaction.     [name]

## 2011-08-02 NOTE — H&P (View-Only) (Signed)
VASCULAR PROGRESS NOTE  SUBJECTIVE: Notified of patient's admission. Patient now agreeable to proceed with left below the knee amputation.  PHYSICAL EXAM: Filed Vitals:   07/31/11 1732 07/31/11 1839 07/31/11 2045 08/01/11 0610  BP: 152/92 155/88 140/85 123/72  Pulse: 91 94 90 62  Temp:  97.6 F (36.4 C) 97.9 F (36.6 C) 97.3 F (36.3 C)  TempSrc:  Oral Oral Oral  Resp: 23 22 20 20   Height: 5' 8.9" (1.75 m) 5\' 8"  (1.727 m)    Weight: 168 lb 10.4 oz (76.5 kg) 156 lb 8.4 oz (71 kg)  158 lb 11.7 oz (72 kg)  SpO2: 100% 100% 99% 92%   Extensive wound of the left foot with odor noted.  LABS: Lab Results  Component Value Date   WBC 16.6* 08/01/2011   HGB 8.2* 08/01/2011   HCT 24.9* 08/01/2011   MCV 76.1* 08/01/2011   PLT 269 08/01/2011   Lab Results  Component Value Date   CREATININE 1.03 08/01/2011   Lab Results  Component Value Date   INR 1.42 07/31/2011   CBG (last 3)   Basename 08/01/11 1148 08/01/11 0619 07/31/11 1812  GLUCAP 164* 118* 136*     ASSESSMENT/PLAN: 1. This is a patient who I had previously seen with critical lower limb ischemia. He has gangrene of the left foot. He has undergone an arteriogram which demonstrates that his left femoral to above-knee popliteal artery bypass graft is patent, however he has severe tibial artery occlusive disease with no options for revascularization. I previously discussed with him below the knee amputation however he was initially reluctant to proceed. we had also discussed the option of above-the-knee amputation. He later was agreeable for left below the knee amputation was scheduled to have surgery by Dr. Gretta Began. However he underwent preoperative cardiac evaluation and surgery was delayed. He is admitted now with left leg pain and is now agreeable to proceed with amputation. I have discussed with him the 20% risk of nonhealing a below-the-knee amputation. I've explained that almost always an above-the-knee amputation will heal. Also  explained it would be very difficult for him to use bilateral prostheses. He does have a prosthesis for his right BKA. I've explained that if he would seriously motivated to try to become ambulatory with bilateral prostheses it would be reasonable to attempt below the knee amputation which would increase his chances of success. However, again this would be associated with a 20% risk of nonhealing. He would like to proceed with left below-knee amputation. This is scheduled for Dr. Arbie Cookey tomorrow. He is currently on intravenous antibiotics. We will hold his SQ heparin in the morning. He is on Plavix which does slightly increase his risk of postoperative bleeding.  Waverly Ferrari, MD, FACS Beeper: [phone] 08/01/2011

## 2011-08-02 NOTE — Anesthesia Procedure Notes (Signed)
Procedure Name: Intubation Date/Time: 08/02/2011 12:17 PM Performed by: Lafreda Casebeer S Pre-anesthesia Checklist: Patient identified, Emergency Drugs available, Suction available, Patient being monitored and Timeout performed Patient Re-evaluated:Patient Re-evaluated prior to inductionOxygen Delivery Method: Circle system utilized Preoxygenation: Pre-oxygenation with 100% oxygen Intubation Type: IV induction and Cricoid Pressure applied Ventilation: Mask ventilation without difficulty and Oral airway inserted - appropriate to patient size Laryngoscope Size: Mac and 4 Grade View: Grade II Tube type: Oral Tube size: 8.0 mm Number of attempts: 1 Airway Equipment and Method: Stylet Placement Confirmation: positive ETCO2,  ETT inserted through vocal cords under direct vision and breath sounds checked- equal and bilateral Secured at: 22 cm Tube secured with: Tape Dental Injury: Teeth and Oropharynx as per pre-operative assessment

## 2011-08-02 NOTE — Anesthesia Preprocedure Evaluation (Addendum)
Anesthesia Evaluation  Patient identified by MRN, date of birth, ID band Patient awake    Reviewed: Allergy & Precautions, H&P , NPO status , Patient's Chart, lab work & pertinent test results, reviewed documented beta blocker date and time   Airway Mallampati: II  Neck ROM: Full    Dental  (+) Edentulous Upper and Edentulous Lower   Pulmonary shortness of breath,  breath sounds clear to auscultation        Cardiovascular hypertension, Pt. on medications and Pt. on home beta blockers + CAD + dysrhythmias + pacemaker Rhythm:Regular Rate:Normal     Neuro/Psych CVA, Residual Symptoms negative psych ROS   GI/Hepatic   Endo/Other  Type 2, Insulin Dependent  Renal/GU      Musculoskeletal negative musculoskeletal ROS (+)   Abdominal   Peds  Hematology negative hematology ROS (+)   Anesthesia Other Findings   Reproductive/Obstetrics                         Anesthesia Physical Anesthesia Plan  ASA: III  Anesthesia Plan: General   Post-op Pain Management:    Induction: Intravenous  Airway Management Planned: Oral ETT  Additional Equipment:   Intra-op Plan:   Post-operative Plan: Extubation in OR  Informed Consent: I have reviewed the patients History and Physical, chart, labs and discussed the procedure including the risks, benefits and alternatives for the proposed anesthesia with the patient or authorized representative who has indicated his/her understanding and acceptance.   Dental advisory given  Plan Discussed with: CRNA, Surgeon and Anesthesiologist  Anesthesia Plan Comments: (PVD with gangrene L. LE Ischemic cardiomyopathy EF 30% no inducible ischemia on nuclear stress test 07/26/11 htn Type 2 DM glucose 120 Dual Chamber pacemaker for CHB will assume he is pacer dependent H/O CVA  Plan GA with Oral ETT  Kipp Brood, MD)      Anesthesia Quick Evaluation

## 2011-08-02 NOTE — Progress Notes (Signed)
Subjective:  Patient denies any chest pain or shortness of breath complains of left foot pain scheduled for left below-knee amputation today  Objective:  Vital Signs in the last 24 hours: Temp:  [96.5 F (35.8 C)-97.3 F (36.3 C)] 97.3 F (36.3 C) (07/17 0438) Pulse Rate:  [50-60] 60  (07/17 0438) Resp:  [19-20] 19  (07/17 0438) BP: (101-115)/(62-67) 110/64 mmHg (07/17 0438) SpO2:  [94 %-97 %] 94 % (07/17 0438)  Intake/Output from previous day: 07/16 0701 - 07/17 0700 In: 480 [P.O.:480] Out: 400 [Urine:400] Intake/Output from this shift:    Physical Exam: Neck: no adenopathy, no carotid bruit, no JVD and supple, symmetrical, trachea midline Lungs: clear to auscultation bilaterally Heart: regular rate and rhythm, S1, S2 normal, no murmur, click, rub or gallop Abdomen: soft, non-tender; bowel sounds normal; no masses,  no organomegaly Extremities: No clubbing cyanosis edema right BKA left gangrenous foot foul-smelling drainage  Lab Results:  Basename 08/02/11 0510 08/01/11 0525  WBC 16.2* 16.6*  HGB 8.7* 8.2*  PLT 254 269    Basename 08/02/11 0510 08/01/11 0525  NA 138 138  K 4.6 4.7  CL 105 105  CO2 25 25  GLUCOSE 128* 125*  BUN 27* 32*  CREATININE 0.89 1.03   No results found for this basename: TROPONINI:2,CK,MB:2 in the last 72 hours Hepatic Function Panel  Basename 07/31/11 1737  PROT 7.8  ALBUMIN 2.3*  AST 46*  ALT 67*  ALKPHOS 218*  BILITOT 1.0  BILIDIR --  IBILI --   No results found for this basename: CHOL in the last 72 hours No results found for this basename: PROTIME in the last 72 hours  Imaging: Imaging results have been reviewed and Dg Abd Acute W/chest  07/31/2011  *RADIOLOGY REPORT*  Clinical Data: Abdominal pain.  Constipation.  High blood pressure.  ACUTE ABDOMEN SERIES (ABDOMEN 2 VIEW & CHEST 1 VIEW)  Comparison: 07/19/2011.  Findings: Sequential pacemaker is in place with leads unchanged in position.  Cardiomegaly.  Central pulmonary  vascular prominence.  No segmental infiltrate or gross pneumothorax.  Calcified slightly tortuous aorta.  Prominence of the superior mediastinum may be related to enlarged thyroid gland with slight displacement of the trachea to the right. This is incompletely assessed by plain film exam.  No plain film evidence of bowel obstruction or free intraperitoneal air.  IMPRESSION: No plain film evidence of bowel obstruction or free intraperitoneal air.  Please see above discussion.  Original Report Authenticated By: Fuller Canada, M.D.    Cardiac Studies:  Assessment/Plan:  Gangrenous left foot with osteomyelitis  Coronary artery disease history of MI in the past  Ischemic cardiomyopathy  Compensated systolic heart failure  History of complete heart block status post permanent pacemaker  Severe peripheral vascular disease history of right below-knee amputation in the past  Hypertension  Diabetes matters  Hypercholesteremia  History of CVA and PTA to middle cerebral artery in the past  Plan Continue present management  LOS: 2 days    Audrianna Driskill N 08/02/2011, 8:16 AM

## 2011-08-02 NOTE — Progress Notes (Signed)
ANTIBIOTIC CONSULT NOTE - FOLLOW UP  Pharmacy Consult for Vancomycin Indication: necrotic left leg  No Known Allergies  Vital Signs: Temp: 97.3 F (36.3 C) (07/17 0438) Temp src: Oral (07/17 0438) BP: 110/64 mmHg (07/17 0438) Pulse Rate: 60  (07/17 0438) Intake/Output from previous day: 07/16 0701 - 07/17 0700 In: 480 [P.O.:480] Out: 400 [Urine:400] Intake/Output from this shift:    Labs:  Basename 08/02/11 0510 08/01/11 0525 07/31/11 1914  WBC 16.2* 16.6* 20.9*  HGB 8.7* 8.2* 9.5*  PLT 254 269 301  LABCREA -- -- --  CREATININE 0.89 1.03 1.12   Estimated Creatinine Clearance: 66.2 ml/min (by C-G formula based on Cr of 0.89).  Microbiology: Recent Results (from the past 720 hour(s))  SURGICAL PCR SCREEN     Status: Normal   Collection Time   07/19/11 11:45 AM      Component Value Range Status Comment   MRSA, PCR NEGATIVE  NEGATIVE Final    Staphylococcus aureus NEGATIVE  NEGATIVE Final     Anti-infectives     Start     Dose/Rate Route Frequency Ordered Stop   08/02/11 0600   ceFAZolin (ANCEF) IVPB 2 g/50 mL premix        2 g 100 mL/hr over 30 Minutes Intravenous 30 min pre-op 08/01/11 1509     07/31/11 1800   vancomycin (VANCOCIN) 1,250 mg in sodium chloride 0.9 % 250 mL IVPB        1,250 mg 166.7 mL/hr over 90 Minutes Intravenous Every 24 hours 07/31/11 1752           Assessment: 80yom continues on day #3 vancomycin for gangrenous left foot with osteo. He is scheduled for left BKA today.  Since vancomycin therapy initiated, patient's renal function has improved. Will now aim for a higher vancomycin trough as well given osteo.  Goal of Therapy:  Vancomycin trough level 15-20 mcg/ml  Plan:  1) Change vancomycin to 1g IV q12 2) Follow up after OR to see if vancomycin is to continue 3) If to continue, trough at steady state  Fredrik Rigger 08/02/2011,10:15 AM

## 2011-08-02 NOTE — Anesthesia Postprocedure Evaluation (Signed)
  Anesthesia Post-op Note  Patient: [name]  Procedure(s) Performed: Procedure(s) (LRB): AMPUTATION BELOW KNEE (Left)  Patient Location: PACU  Anesthesia Type: General  Level of Consciousness: awake and alert   Airway and Oxygen Therapy: Patient Spontanous Breathing and Patient connected to nasal cannula oxygen  Post-op Pain: mild  Post-op Assessment: Post-op Vital signs reviewed and Patient's Cardiovascular Status Stable  Post-op Vital Signs: stable  Complications: No apparent anesthesia complications

## 2011-08-03 DIAGNOSIS — I739 Peripheral vascular disease, unspecified: Secondary | ICD-10-CM

## 2011-08-03 DIAGNOSIS — S88119A Complete traumatic amputation at level between knee and ankle, unspecified lower leg, initial encounter: Secondary | ICD-10-CM

## 2011-08-03 DIAGNOSIS — L98499 Non-pressure chronic ulcer of skin of other sites with unspecified severity: Secondary | ICD-10-CM

## 2011-08-03 LAB — CBC
Hemoglobin: 8.8 g/dL — ABNORMAL LOW (ref 13.0–17.0)
MCH: 24.9 pg — ABNORMAL LOW (ref 26.0–34.0)
MCHC: 32.5 g/dL (ref 30.0–36.0)
Platelets: 257 10*3/uL (ref 150–400)
RDW: 17.2 % — ABNORMAL HIGH (ref 11.5–15.5)

## 2011-08-03 LAB — BASIC METABOLIC PANEL
BUN: 23 mg/dL (ref 6–23)
Calcium: 8.9 mg/dL (ref 8.4–10.5)
GFR calc Af Amer: >90 mL/min (ref >90)
GFR calc non Af Amer: 80 mL/min — ABNORMAL LOW (ref >90)
Glucose, Bld: 164 mg/dL — ABNORMAL HIGH (ref 70–99)
Potassium: 4.9 mEq/L (ref 3.5–5.1)

## 2011-08-03 LAB — GLUCOSE, CAPILLARY: Glucose-Capillary: 101 mg/dL — ABNORMAL HIGH (ref 70–99)

## 2011-08-03 NOTE — Progress Notes (Signed)
Referral rec'd for ?SNF- Per Fayrene Fearing, OT,patient is somewhat confused- I did speak with patient who has given me permission to contact family to discuss d/c plans- per OT his sister wants to be involved in SNF discussions. Will ask CSW to contact family tomorrow to assess and proceed with ?SNF Reece Levy, MSW, [place] [phone]

## 2011-08-03 NOTE — Progress Notes (Signed)
Subjective:  Patient denies any chest pain or shortness of breath. States feels better after the surgery with minimal pain left amputated leg.  Objective:  Vital Signs in the last 24 hours: Temp:  [95.9 F (35.5 C)-97.5 F (36.4 C)] 97.5 F (36.4 C) (07/18 0329) Pulse Rate:  [59-96] 64  (07/18 0329) Resp:  [15-32] 18  (07/18 0329) BP: (111-129)/(56-72) 120/67 mmHg (07/18 0329) SpO2:  [93 %-100 %] 98 % (07/18 0329)  Intake/Output from previous day: 07/17 0701 - 07/18 0700 In: 1950 [P.O.:150; I.V.:1550; IV Piggyback:250] Out: 75 [Blood:75] Intake/Output from this shift:    Physical Exam: Neck: no adenopathy, no carotid bruit, no JVD and supple, symmetrical, trachea midline Lungs: clear to auscultation bilaterally Heart: regular rate and rhythm, S1, S2 normal and Soft systolic murmur no S3 gallop Abdomen: soft, non-tender; bowel sounds normal; no masses,  no organomegaly Extremities: Bilateral BKA. Surgical dressing left BKA dry  Lab Results:  Basename 08/03/11 0525 08/02/11 0510  WBC 8.9 16.2*  HGB 8.8* 8.7*  PLT 257 254    Basename 08/03/11 0525 08/02/11 0510  NA 136 138  K 4.9 4.6  CL 104 105  CO2 24 25  GLUCOSE 164* 128*  BUN 23 27*  CREATININE 0.88 0.89   No results found for this basename: TROPONINI:2,CK,MB:2 in the last 72 hours Hepatic Function Panel  Basename 07/31/11 1737  PROT 7.8  ALBUMIN 2.3*  AST 46*  ALT 67*  ALKPHOS 218*  BILITOT 1.0  BILIDIR --  IBILI --   No results found for this basename: CHOL in the last 72 hours No results found for this basename: PROTIME in the last 72 hours  Imaging: Imaging results have been reviewed and No results found.  Cardiac Studies:  Assessment/Plan:  Gangrenous left foot with osteomyelitis status post left BKA postop day 1 doing well Coronary artery disease history of MI in the past  Ischemic cardiomyopathy  Compensated systolic heart failure  History of complete heart block status post permanent  pacemaker  Severe peripheral vascular disease history of right below-knee amputation in the past  Hypertension  Diabetes matters  Hypercholesteremia  History of CVA and PTA to middle cerebral artery in the past  Plan Continue present management per vascular surgery Social service for discharge planning  LOS: 3 days    Nikka Hakimian N 08/03/2011, 8:00 AM

## 2011-08-03 NOTE — Consult Note (Signed)
Physical Medicine and Rehabilitation Consult Reason for Consult: Left BKA Referring Physician: Dr. Sharyn Lull   HPI: Nyeem Stoke is a 78 y.o. right-handed male with history of coronary artery disease, congestive heart failure with ischemic cardiomyopathy as well as permanent pacemaker, CVA and history of right below-knee amputation approximately 6 years ago and patient has a prosthesis. Admitted 07/31/2011 with peripheral vascular disease and ischemic gangrenous left foot. Recent arteriogram demonstrated left femoral to above knee popliteal artery bypass graft being patent with severe tibial artery occlusive disease. Not felt to be a candidate for revascularization. Underwent left below-knee amputation 08/02/2011 per Dr. Arbie Cookey. Postoperative pain control. Postoperative anemia 8.7 and monitored. Physical and occupational therapy evaluations are pending. M.D. is requested physical medicine rehabilitation consult to consider inpatient rehabilitation services  Review of Systems  Cardiovascular: Positive for palpitations and leg swelling.  Gastrointestinal: Positive for constipation.  Musculoskeletal: Positive for myalgias.  All other systems reviewed and are negative.   Past Medical History  Diagnosis Date  . Hypercholesteremia     takes Lipitor daily  . CAD (coronary artery disease)     s/p PTCA to PLV branch in 2002  . Non-insulin dependent diabetes mellitus   . PVD (peripheral vascular disease)     s/p right BKA  . CVA (cerebral infarction)     s/p PTA to middle cerebral artery;slurred speech  . HTN (hypertension)     takes Benazepril daily  . Pacemaker   . Shortness of breath     sitting as well as standing  . Glaucoma   . Complete heart block     s/p Medtronic PPM by JA 09/2009  . Stroke    Past Surgical History  Procedure Date  . Pacemaker insertion 09/2010  . Appendectomy   . Amputation     below amputation  . Cardiac catheterization     2002/2009   Family History  Problem  Relation Age of Onset  . Heart failure Mother   . Heart failure Father   . Prostate cancer Brother    Social History:  reports that he has never smoked. He has never used smokeless tobacco. He reports that he does not drink alcohol or use illicit drugs. Allergies: No Known Allergies Medications Prior to Admission  Medication Sig Dispense Refill  . aspirin EC 325 MG tablet Take 325 mg by mouth daily.        Marland Kitchen atorvastatin (LIPITOR) 80 MG tablet Take 80 mg by mouth daily.      . clopidogrel (PLAVIX) 75 MG tablet Take 75 mg by mouth daily.        Home:    Functional History:   Functional Status:  Mobility:          ADL:    Cognition: Cognition Orientation Level: Oriented to person;Oriented to place;Oriented to situation    Blood pressure 120/67, pulse 64, temperature 97.5 F (36.4 C), temperature source Oral, resp. rate 18, height 5\' 8"  (1.727 m), weight 72 kg (158 lb 11.7 oz), SpO2 98.00%. Physical Exam  HENT:  Head: Normocephalic.  Eyes: Conjunctivae and EOM are normal. Left eye exhibits no discharge. No scleral icterus.  Neck: Neck supple. No thyromegaly present.  Cardiovascular: Normal rate and regular rhythm.   Pulmonary/Chest: Breath sounds normal. No respiratory distress. He has no wheezes.  Abdominal: Bowel sounds are normal. He exhibits no distension. There is no tenderness.  Musculoskeletal:       Significant pain in left lower extremity into left buttock. Uncomfortable in sitting. Could  not find a comfortable position. Very distractable due to pain  Neurological: He is alert. A sensory deficit is present.       Patient oriented x3 To Person, place and time. He follows three-step commands. Strength 3-4/5.   Skin:       Right below knee amputation site is well healed. Dry dressing in place to left below-knee amputation site  Psychiatric: His mood appears anxious.    Results for orders placed during the hospital encounter of 07/31/11 (from the past 24 hour(s))   GLUCOSE, CAPILLARY     Status: Abnormal   Collection Time   08/02/11 11:37 AM      Component Value Range   Glucose-Capillary 120 (*) 70 - 99 mg/dL  GLUCOSE, CAPILLARY     Status: Abnormal   Collection Time   08/02/11  1:57 PM      Component Value Range   Glucose-Capillary 126 (*) 70 - 99 mg/dL  GLUCOSE, CAPILLARY     Status: Abnormal   Collection Time   08/02/11  4:18 PM      Component Value Range   Glucose-Capillary 121 (*) 70 - 99 mg/dL  GLUCOSE, CAPILLARY     Status: Abnormal   Collection Time   08/02/11  9:37 PM      Component Value Range   Glucose-Capillary 135 (*) 70 - 99 mg/dL   Comment 1 Notify RN     No results found.  Assessment/Plan: Diagnosis: left BKA (hx of right BKA) 1. Does the need for close, 24 hr/day medical supervision in concert with the patient's rehab needs make it unreasonable for this patient to be served in a less intensive setting? Potentially 2. Co-Morbidities requiring supervision/potential complications: dm, htn, heart block, pvd 3. Due to bladder management, bowel management, safety, skin/wound care, disease management, medication administration, pain management and patient education, does the patient require 24 hr/day rehab nursing? Yes 4. Does the patient require coordinated care of a physician, rehab nurse, PT (1-2 hrs/day, 5 days/week) and OT (1-2 hrs/day, 5 days/week) to address physical and functional deficits in the context of the above medical diagnosis(es)? Yes Addressing deficits in the following areas: balance, endurance, locomotion, strength, transferring, bowel/bladder control, bathing, dressing, feeding, grooming, toileting and psychosocial support 5. Can the patient actively participate in an intensive therapy program of at least 3 hrs of therapy per day at least 5 days per week? Potentially 6. The potential for patient to make measurable gains while on inpatient rehab is good 7. Anticipated functional outcomes upon discharge from inpatient  rehab are wc min assist with PT, wc min to mod assist with OT 8. Estimated rehab length of stay to reach the above functional goals is: ?2 weeks 9. Does the patient have adequate social supports to accommodate these discharge functional goals? Potentially 10. Anticipated D/C setting: Home 11. Anticipated post D/C treatments: HH therapy 12. Overall Rehab/Functional Prognosis: good and fair  RECOMMENDATIONS: This patient's condition is appropriate for continued rehabilitative care in the following setting: CIR vs SNF Patient has agreed to participate in recommended program. Potentially Note that insurance prior authorization may be required for reimbursement for recommended care.  Comment: Pt with poor therapy tolerance today. I'm not surprised given his pain levels and distractibility.  I was able to get out of him, that he was using his right BK prosthesis to ambulate PTA. I'm not sure how long ago it was since he actually used it, however. Will follow for improved activity tolerance and pain levels.  If no improvement, then SNF,  Ivory Broad, MD    08/03/2011

## 2011-08-03 NOTE — Evaluation (Signed)
Clinical/Bedside Swallow Evaluation Patient Details  Name: [name] MRN: [id number] Date of Birth: 12-09-1931  Today's Date: 08/03/2011 Time: 2130-8657 SLP Time Calculation (min): 30 min  Past Medical History:  Past Medical History  Diagnosis Date  . Hypercholesteremia     takes Lipitor daily  . CAD (coronary artery disease)     s/p PTCA to PLV branch in 2002  . Non-insulin dependent diabetes mellitus   . PVD (peripheral vascular disease)     s/p right BKA  . CVA (cerebral infarction)     s/p PTA to middle cerebral artery;slurred speech  . HTN (hypertension)     takes Benazepril daily  . Pacemaker   . Shortness of breath     sitting as well as standing  . Glaucoma   . Complete heart block     s/p Medtronic PPM by JA 09/2009  . Stroke    Past Surgical History:  Past Surgical History  Procedure Date  . Pacemaker insertion 09/2010  . Appendectomy   . Amputation     below amputation  . Cardiac catheterization     2002/2009   HPI:  [name] is a 78 y.o. right-handed male with history of coronary artery disease, congestive heart failure with ischemic cardiomyopathy as well as permanent pacemaker, CVA and history of right below-knee amputation approximately 6 years ago and patient has a prosthesis. Admitted 07/31/2011 with peripheral vascular disease and ischemic gangrenous left foot. Underwent left BKA on 08/02/11. MD reports pt has not been able to swallow pills with water.    Assessment / Plan / Recommendation Clinical Impression  Pt demonstrated a mild oral dyspahgia likely secondary to mildly impaired cognition. Slow mastication with piecemeal deglutition of masticated solid bolus. RN reports pt with difficulty transiting pills, much improved when given whole in puree. She also reports limited PO intake. SL Pwill downgrade diet to a dysphagia 2 (fine chop) to facilitate PO intake. SLP will f/u x1 to check pts to[place].     Aspiration Risk  None    Diet  Recommendation Dysphagia 2 (Fine chop);Thin liquid   Liquid Administration via: Cup;Straw Medication Administration: Whole meds with puree Supervision: Patient able to self feed;Intermittent supervision to cue for compensatory strategies Compensations: Slow rate;Small sips/bites Postural Changes and/or Swallow Maneuvers: Seated upright 90 degrees;Upright 30-60 min after meal    Other  Recommendations Oral Care Recommendations: Oral care BID   Follow Up Recommendations       Frequency and Duration min 1 x/week  2 weeks   Pertinent Vitals/Pain NA    SLP Swallow Goals Patient will consume recommended diet without observed clinical signs of aspiration with: Independent assistance Patient will utilize recommended strategies during swallow to increase swallowing safety with: Independent assistance   Swallow Study Prior Functional Status  Type of Home: House Lives With: Alone Available Help at Discharge: Family Vocation: Retired    Radio producer HPI: [name] is a 78 y.o. right-handed male with history of coronary artery disease, congestive heart failure with ischemic cardiomyopathy as well as permanent pacemaker, CVA and history of right below-knee amputation approximately 6 years ago and patient has a prosthesis. Admitted 07/31/2011 with peripheral vascular disease and ischemic gangrenous left foot. Underwent left BKA on 08/02/11. MD reports pt has not been able to swallow pills with water.  Type of Study: Bedside swallow evaluation Diet Prior to this Study: Regular;Thin liquids Temperature Spikes Noted: No Respiratory Status: Room air Behavior/Cognition: Alert;Cooperative;Decreased sustained attention Oral Cavity - Dentition: Edentulous Self-Feeding Abilities: Able  to feed self Patient Positioning: Upright in bed Baseline Vocal Quality: Clear Volitional Cough: Strong Volitional Swallow: Able to elicit    Oral/Motor/Sensory Function Overall Oral Motor/Sensory Function: Appears  within functional limits for tasks assessed   Ice Chips     Thin Liquid Thin Liquid: Within functional limits    Nectar Thick Nectar Thick Liquid: Not tested   Honey Thick Honey Thick Liquid: Not tested   Puree Puree: Within functional limits   Solid Solid: Impaired Presentation: Self Fed Oral Phase Functional Implications: Other (comment) (slow piecemeal deglutition of solid bolus)    Alesia Oshields, Riley Nearing 08/03/2011,4:05 PM

## 2011-08-03 NOTE — Evaluation (Signed)
Physical Therapy Evaluation Patient Details Name: [name] MRN: [id number] DOB: Mar 24, 1931 Today's Date: 08/03/2011 Time: 7846-9629 PT Time Calculation (min): 18 min  PT Assessment / Plan / Recommendation Clinical Impression  Pt is a 78 y/o male admitted s/p left BKA with history of right BKA along with the below PT problem list.  Pt would benefit from acute PT to maximize independence and facilitate d/c to SNF.    PT Assessment  Patient needs continued PT services    Follow Up Recommendations  Skilled nursing facility    Barriers to Discharge Decreased caregiver support      Equipment Recommendations  Defer to next venue    Recommendations for Other Services     Frequency Min 3X/week    Precautions / Restrictions Precautions Precautions: Fall Restrictions Weight Bearing Restrictions: No   Pertinent Vitals/Pain None      Mobility  Bed Mobility Bed Mobility: Supine to Sit;Sitting - Scoot to Edge of Bed Supine to Sit: 1: +2 Total assist;HOB flat Supine to Sit: Patient Percentage: 20% Sitting - Scoot to Edge of Bed: 1: +2 Total assist Sitting - Scoot to Edge of Bed: Patient Percentage: 20% Details for Bed Mobility Assistance: Assist to translate trunk anterior due to strong posterior lean with cues for sequence and safety.  Assist with pad to shift weight left and right reciprocally scooting hips to EOB. Transfers Transfers: Risk manager: 1: +2 Total assist;To level surface Anterior-Posterior Transfers: Patient Percentage: 20% Details for Transfer Assistance: Assist with pad to off weight bilateral ischials in order to scoot hips anterior to posterior with cues to use bilateral UEs in order to attempt to off weight ischials and scoot posterior. Ambulation/Gait Ambulation/Gait Assistance: Not tested (comment) Stairs: No Wheelchair Mobility Wheelchair Mobility: No    Exercises     PT Diagnosis: Generalized weakness  PT  Problem List: Decreased strength;Decreased activity tolerance;Decreased balance;Decreased mobility;Decreased safety awareness PT Treatment Interventions: Functional mobility training;Therapeutic activities;Therapeutic exercise;Balance training;Patient/family education   PT Goals Acute Rehab PT Goals PT Goal Formulation: With patient Time For Goal Achievement: 08/17/11 Potential to Achieve Goals: Fair Pt will Roll Supine to Right Side: with min assist PT Goal: Rolling Supine to Right Side - Progress: Goal set today Pt will Roll Supine to Left Side: with min assist PT Goal: Rolling Supine to Left Side - Progress: Goal set today Pt will go Supine/Side to Sit: with mod assist PT Goal: Supine/Side to Sit - Progress: Goal set today Pt will Sit at Edge of Bed: with min assist;3-5 min;with bilateral upper extremity support PT Goal: Sit at Edge Of Bed - Progress: Goal set today Pt will go Sit to Supine/Side: with mod assist PT Goal: Sit to Supine/Side - Progress: Goal set today Pt will Transfer Bed to Chair/Chair to Bed: with mod assist PT Transfer Goal: Bed to Chair/Chair to Bed - Progress: Goal set today  Visit Information  Last PT Received On: 08/03/11 Assistance Needed: +2    Subjective Data  Subjective: "I can't walk." Patient Stated Goal: Get well.   Prior Functioning  Home Living Lives With: Spouse Available Help at Discharge: Family Type of Home: House Home Access: Level entry Home Layout: Two level;Able to live on main level with bedroom/bathroom Home Adaptive Equipment: Walker - rolling Prior Function Level of Independence: Independent with assistive device(s) (Used RW.) Able to Take Stairs?: Yes Driving: Yes Vocation: Retired Musician: No difficulties    Cognition  Overall Cognitive Status: Appears within functional limits for tasks assessed/performed  Arousal/Alertness: Awake/alert Orientation Level: Appears intact for tasks assessed Behavior  During Session: [facility] for tasks performed    Extremity/Trunk Assessment Right Upper Extremity Assessment RUE ROM/Strength/Tone:  (Defer to OT evaluation.) Left Upper Extremity Assessment LUE ROM/Strength/Tone:  (Defer to OT evaluation.) Right Lower Extremity Assessment RLE ROM/Strength/Tone: Within functional levels (Prior R BKA.  Has prosthesis.) RLE Sensation: WFL - Light Touch RLE Coordination: WFL - gross motor Left Lower Extremity Assessment LLE ROM/Strength/Tone: Deficits LLE ROM/Strength/Tone Deficits: 3/5 throughout. LLE Sensation: Deficits LLE Sensation Deficits: Decreased to light touch. LLE Coordination: WFL - gross motor Trunk Assessment Trunk Assessment: Normal   Balance Balance Balance Assessed: Yes Static Sitting Balance Static Sitting - Balance Support: Bilateral upper extremity supported Static Sitting - Level of Assistance: 3: Mod assist Static Sitting - Comment/# of Minutes: Pt sat EOB for 5 minutes with up to mod assist to attain and maintain anterior lean over BOS.  Pt with tendency to lean posterior.  End of Session PT - End of Session Activity Tolerance: Patient tolerated treatment well Patient left: in chair;with call bell/phone within reach Nurse Communication: Mobility status  GP     Cephus Shelling 08/03/2011, 10:17 AM  08/03/2011 Cephus Shelling, PT, DPT [phone]

## 2011-08-03 NOTE — Evaluation (Signed)
Occupational Therapy Evaluation Patient Details Name: [name] MRN: [id number] DOB: 21-Jul-1931 Today's Date: 08/03/2011 Time: 6387-5643 OT Time Calculation (min): 27 min  OT Assessment / Plan / Recommendation Clinical Impression  78yr old male with history of L BKA now with new R BKA.  Feel he will benefit from acute OT services to address new increase in dependence with all basic selfcare tasks and functional transfers.  feel he will need follow-up SNF level therapies at discharge secondary to not having 24 hour supervision at home.    OT Assessment  Patient needs continued OT Services    Follow Up Recommendations  Skilled nursing facility    Barriers to Discharge Decreased caregiver support    Equipment Recommendations  Defer to next venue       Frequency  Min 2X/week    Precautions / Restrictions Precautions Precautions: Fall Precaution Comments: history of R BKA Restrictions Weight Bearing Restrictions: No   Pertinent Vitals/Pain Faces scale for pain 3 in the LLE, pt repositioned   ADL  Eating/Feeding: Simulated;Set up Where Assessed - Eating/Feeding: Chair Grooming: Performed;Wash/dry face;Set up Where Assessed - Grooming: Supported sitting Upper Body Bathing: Simulated;Minimal assistance Where Assessed - Upper Body Bathing: Supported sitting Lower Body Bathing: Simulated;Maximal assistance Where Assessed - Lower Body Bathing: Supine, head of bed up;Rolling right and/or left Upper Body Dressing: Simulated;Moderate assistance Where Assessed - Upper Body Dressing: Supported sitting Lower Body Dressing: Simulated;Maximal assistance Where Assessed - Lower Body Dressing: Supine, head of bed flat;Rolling right and/or left Toilet Transfer: +2 Total assistance Toilet Transfer: Patient Percentage: 10% Toilet Transfer Method: Anterior-posterior Toilet Transfer Equipment: Other (comment) (simulated to bed from bedside chair.  Still with cathetor) Toileting - Clothing  Manipulation and Hygiene: +1 Total assistance Where Assessed - Toileting Clothing Manipulation and Hygiene: Supine, head of bed flat;Rolling right and/or left Transfers/Ambulation Related to ADLs: Pt requiring total +2 (pt 10%) for anterior posterior transfer to bed from bedside chair.  Demonstrates posterior lean in sitting and when attempting to scoot. ADL Comments: Pt confused, decreased ability to follow manual muscle test directions or follow 2 step commands.  Mod assist for dynamic sitting balance with simulated selfcare tasks.  Decreased ability to raise either arm for simulated dressing and bathing tasks.    OT Diagnosis: Generalized weakness;Cognitive deficits;Acute pain  OT Problem List: Decreased strength;Impaired balance (sitting and/or standing);Decreased activity tolerance;Decreased safety awareness;Decreased knowledge of use of DME or AE;Decreased cognition;Pain;Impaired UE functional use OT Treatment Interventions: Self-care/ADL training;Therapeutic exercise;DME and/or AE instruction;Neuromuscular education;Balance training;Patient/family education;Therapeutic activities   OT Goals Acute Rehab OT Goals OT Goal Formulation: With patient/family Time For Goal Achievement: 08/17/11 Potential to Achieve Goals: Good ADL Goals Pt Will Perform Grooming: with supervision;Unsupported;Sitting, edge of bed ADL Goal: Grooming - Progress: Goal set today Pt Will Perform Upper Body Bathing: with set-up;Unsupported;Sitting, edge of bed ADL Goal: Upper Body Bathing - Progress: Goal set today Pt Will Perform Lower Body Bathing: with min assist;Sitting, edge of bed;Other (comment);Unsupported (lateral leans for peri area) ADL Goal: Lower Body Bathing - Progress: Goal set today Pt Will Perform Upper Body Dressing: Sitting, bed;with supervision ADL Goal: Upper Body Dressing - Progress: Goal set today Pt Will Perform Lower Body Dressing: with min assist;Unsupported;Sitting, bed (lateral leans for  pulling clothing over hips) ADL Goal: Lower Body Dressing - Progress: Goal set today Miscellaneous OT Goals Miscellaneous OT Goal #1: Pt will increase bilateral shoulder strength to at least 3/5 for greater use with selfcare tasks. OT Goal: Miscellaneous Goal #1 - Progress:  Goal set today  Visit Information  Last OT Received On: 08/03/11 Assistance Needed: +2    Subjective Data  Subjective: "I just came back from surgery." Patient Stated Goal: Pt unable to state but wanted to get back into the bed from the bedside chair.   Prior Functioning  Vision/Perception  Home Living Lives With: Alone Available Help at Discharge: Family Type of Home: House Home Access: Level entry Home Layout: Two level;Able to live on main level with bedroom/bathroom Home Adaptive Equipment: Walker - rolling Additional Comments: Pt unreliable for current information duirng OT eval. Prior Function Level of Independence: Independent with assistive device(s) (Used RW.) Able to Take Stairs?: Yes Driving: Yes Vocation: Retired Musician: No difficulties Dominant Hand: Right   Vision - Assessment Vision Assessment: Vision not tested Perception Perception: Impaired (Pt incorrect with identifying time.  Stated 6:30 it was 1:40) Praxis Praxis: Intact  Cognition  Overall Cognitive Status: Impaired Arousal/Alertness: Lethargic Orientation Level: Disoriented to;Time;Place;Person Behavior During Session: Lethargic Cognition - Other Comments: When asked how long he had been up in the chair he stated "I just got back from surgery."  Also inconsistent with following 2 step commands.    Extremity/Trunk Assessment Right Upper Extremity Assessment RUE ROM/Strength/Tone: Deficits RUE ROM/Strength/Tone Deficits: Shoulder flexion strength 2-/5,  elbow flexion/extension and grip all 3+/5. RUE Sensation: WFL - Light Touch RUE Coordination: WFL - gross/fine motor Left Upper Extremity Assessment LUE  ROM/Strength/Tone: Deficits LUE ROM/Strength/Tone Deficits: Shoulder flexion strength 2-/5,  elbow flexion/extension and grip all 3+/5. LUE Sensation: WFL - Light Touch LUE Coordination: WFL - gross/fine motor Trunk Assessment Trunk Assessment: Normal   Mobility Bed Mobility Bed Mobility: Rolling Right;Rolling Left;Sit to Supine Rolling Right: With rail;4: Min assist Rolling Left: 4: Min assist;With rail Sit to Supine: 1: +1 Total assist Transfers Transfers: Not assessed Details for Transfer Assistance: Pt lifted into bed using bed pad in anterior position.  Was not able to help very much with the transfer.      Balance Balance Balance Assessed: Yes Static Sitting Balance Static Sitting - Balance Support: Right upper extremity supported;Left upper extremity supported Static Sitting - Level of Assistance: 3: Mod assist  End of Session OT - End of Session Activity Tolerance: Patient limited by pain Patient left: in bed;with call bell/phone within reach;with bed alarm set;with family/visitor present     Evalena Fujii OTR/L 08/03/2011, 2:20 PM Pager number [phone]

## 2011-08-03 NOTE — Progress Notes (Signed)
Subjective: Interval History: none, comfortable with no complaints of pain.   Objective: Vital signs in last 24 hours: Temp:  [95.9 F (35.5 C)-97.5 F (36.4 C)] 97.5 F (36.4 C) (07/18 0329) Pulse Rate:  [59-96] 64  (07/18 0329) Resp:  [15-32] 18  (07/18 0329) BP: (111-129)/(56-72) 120/67 mmHg (07/18 0329) SpO2:  [93 %-100 %] 98 % (07/18 0329)  Intake/Output from previous day: 07/17 0701 - 07/18 0700 In: 1950 [P.O.:150; I.V.:1550; IV Piggyback:250] Out: 75 [Blood:75] Intake/Output this shift:    Left below-knee amputation dressing dry and intact  Lab Results:  Basename 08/03/11 0525 08/02/11 0510  WBC 8.9 16.2*  HGB 8.8* 8.7*  HCT 27.1* 26.4*  PLT 257 254   BMET  Basename 08/03/11 0525 08/02/11 0510  NA 136 138  K 4.9 4.6  CL 104 105  CO2 24 25  GLUCOSE 164* 128*  BUN 23 27*  CREATININE 0.88 0.89  CALCIUM 8.9 9.0    Studies/Results: Dg Chest 2 View  07/19/2011  *RADIOLOGY REPORT*  Clinical Data: Preop for below-the-knee amputation on the left  CHEST - 2 VIEW  Comparison: Chest x-ray of 09/22/2009  Findings: The lungs are clear.  Moderate cardiomegaly is stable.  A dual lead permanent pacemaker remains.  There is persistent prominence of soft tissues in the superior mediastinum probably due to prominent thyroid but clinical correlation is recommended.  This has been present for many years and therefore enlargement of the thyroid is favored.  There are degenerative changes throughout the thoracic spine.  IMPRESSION: Stable cardiomegaly with permanent pacemaker.  Probable enlarged thyroid gland.  Original Report Authenticated By: Juline Patch, M.D.   Nm Myocar Multi W/spect W/wall Motion / Ef  07/26/2011  Clinical Data:  Diabetes.  Hypercholesterolemia and hyper lipidemia.  Preoperative.  Technique:  Standard myocardial SPECT imaging performed after resting intravenous injection of Tc-20m tetrofosmin.  Subsequently, stress in the form of Lexiscan  was administered under  the supervision of the Cardiology staff.  At peak stress, Tc-73m tetrofosmin was injected intravenously and standard myocardial SPECT imaging performed.  Quantitative gated imaging also performed to evaluate left ventricular wall motion and estimate left ventricular ejection fraction.  Radiopharmaceutical: Tc-17m tetrofosmin, 10 mCi at rest and 30 mCi at stress.  Comparison:  09/12/2010  MYOCARDIAL IMAGING WITH SPECT (REST AND STRESS)  Findings:  Apical thinning or scarring noted.  No inducible ischemia is identified.  LEFT VENTRICULAR EJECTION FRACTION  Findings:  Left ventricular end-diastolic volume is 159 cc.  End- systolic volume is 108 cc.  Derived LV ejection fraction is 32%.  GATED LEFT VENTRICULAR WALL MOTION STUDY  Findings:  Moderate to prominent global hypokinesis and poor wall thickening noted with some sparing of the anterior wall, more striking involvement of the posterior wall.  IMPRESSION:  1.  Apical scarring or thinning. 2.  Essentially stable ejection fraction in the low 30s, with global hypokinesis and poor wall thickening similar to the prior exam.  Original Report Authenticated By: Dellia Cloud, M.D.   Dg Abd Acute W/chest  07/31/2011  *RADIOLOGY REPORT*  Clinical Data: Abdominal pain.  Constipation.  High blood pressure.  ACUTE ABDOMEN SERIES (ABDOMEN 2 VIEW & CHEST 1 VIEW)  Comparison: 07/19/2011.  Findings: Sequential pacemaker is in place with leads unchanged in position.  Cardiomegaly.  Central pulmonary vascular prominence.  No segmental infiltrate or gross pneumothorax.  Calcified slightly tortuous aorta.  Prominence of the superior mediastinum may be related to enlarged thyroid gland with slight displacement of the  trachea to the right. This is incompletely assessed by plain film exam.  No plain film evidence of bowel obstruction or free intraperitoneal air.  IMPRESSION: No plain film evidence of bowel obstruction or free intraperitoneal air.  Please see above discussion.   Original Report Authenticated By: Fuller Canada, M.D.   Dg Foot Complete Left  07/17/2011  *RADIOLOGY REPORT*  Clinical Data: Pain, swelling.  No injury.  LEFT FOOT - COMPLETE 3+ VIEW  Comparison: 05/18/2011  Findings: Prior left second toe amputation. Irregularity at the tip of the left great toe distal phalanx.  Recommend clinical correlation.  Soft tissues are intact.  IMPRESSION: Irregularity at the tip of the left great toe distal phalanx.  Cannot exclude osteomyelitis.  Prior second toe amputation.  Original Report Authenticated By: Cyndie Chime, M.D.   Anti-infectives: Anti-infectives     Start     Dose/Rate Route Frequency Ordered Stop   08/02/11 1100   vancomycin (VANCOCIN) IVPB 1000 mg/200 mL premix        1,000 mg 200 mL/hr over 60 Minutes Intravenous Every 12 hours 08/02/11 1019     08/02/11 0600   ceFAZolin (ANCEF) IVPB 2 g/50 mL premix        2 g 100 mL/hr over 30 Minutes Intravenous 30 min pre-op 08/01/11 1509 08/02/11 1223   07/31/11 1800   vancomycin (VANCOCIN) 1,250 mg in sodium chloride 0.9 % 250 mL IVPB  Status:  Discontinued        1,250 mg 166.7 mL/hr over 90 Minutes Intravenous Every 24 hours 07/31/11 1752 08/02/11 1019          Assessment/Plan: s/p Procedure(s) (LRB): AMPUTATION BELOW KNEE (Left) Stable postop day #1, with good pain control. Will check amputation site tomorrow. Should the safe forSNF transfer in 1-2 days   LOS: 3 days   EARLY, TODD 08/03/2011, 7:27 AM

## 2011-08-04 ENCOUNTER — Encounter (HOSPITAL_COMMUNITY): Payer: Self-pay | Admitting: Vascular Surgery

## 2011-08-04 LAB — CBC
HCT: 26.6 % — ABNORMAL LOW (ref 39.0–52.0)
Hemoglobin: 8.7 g/dL — ABNORMAL LOW (ref 13.0–17.0)
MCH: 24.9 pg — ABNORMAL LOW (ref 26.0–34.0)
MCHC: 32.7 g/dL (ref 30.0–36.0)
MCV: 76 fL — ABNORMAL LOW (ref 78.0–100.0)
RBC: 3.5 MIL/uL — ABNORMAL LOW (ref 4.22–5.81)

## 2011-08-04 LAB — GLUCOSE, CAPILLARY
Glucose-Capillary: 107 mg/dL — ABNORMAL HIGH (ref 70–99)
Glucose-Capillary: 153 mg/dL — ABNORMAL HIGH (ref 70–99)
Glucose-Capillary: 137 mg/dL — ABNORMAL HIGH (ref 70–99)

## 2011-08-04 LAB — BASIC METABOLIC PANEL
BUN: 26 mg/dL — ABNORMAL HIGH (ref 6–23)
CO2: 24 mEq/L (ref 19–32)
Chloride: 103 mEq/L (ref 96–112)
GFR calc non Af Amer: 64 mL/min — ABNORMAL LOW (ref >90)
Glucose, Bld: 126 mg/dL — ABNORMAL HIGH (ref 70–99)
Potassium: 4.7 mEq/L (ref 3.5–5.1)
Sodium: 134 mEq/L — ABNORMAL LOW (ref 135–145)

## 2011-08-04 LAB — URINALYSIS, ROUTINE W REFLEX MICROSCOPIC
Glucose, UA: NEGATIVE mg/dL
Hgb urine dipstick: NEGATIVE
Leukocytes, UA: NEGATIVE
Protein, ur: NEGATIVE mg/dL
Specific Gravity, Urine: 1.036 — ABNORMAL HIGH (ref 1.005–1.030)
pH: 5 (ref 5.0–8.0)

## 2011-08-04 MED ORDER — VANCOMYCIN HCL 1000 MG IV SOLR
1250.0000 mg | INTRAVENOUS | Status: DC
  Administered 2011-08-05 – 2011-08-06 (×2): 1250 mg via INTRAVENOUS
  Filled 2011-08-04 (×3): qty 1250

## 2011-08-04 NOTE — Progress Notes (Signed)
ANTIBIOTIC CONSULT NOTE - FOLLOW UP  Pharmacy Consult for vancomycin Indication: gangrenous Left foot now s/p Left BKA 7/17  No Known Allergies  Patient Measurements: Height: 5\' 8"  (172.7 cm) Weight: 158 lb 11.7 oz (72 kg) IBW/kg (Calculated) : 68.4  Adjusted Body Weight:   Vital Signs: Temp: 98.1 F (36.7 C) (07/19 1401) Temp src: Oral (07/19 1401) BP: 94/50 mmHg (07/19 1401) Pulse Rate: 66  (07/19 1401) Intake/Output from previous day: 07/18 0701 - 07/19 0700 In: 3280.7 [P.O.:120; I.V.:3160.7] Out: 200 [Urine:200] Intake/Output from this shift:    Labs:  Basename 08/04/11 0734 08/03/11 0525 08/02/11 0510  WBC 8.0 8.9 16.2*  HGB 8.7* 8.8* 8.7*  PLT 246 257 254  LABCREA -- -- --  CREATININE 1.07 0.88 0.89   Estimated Creatinine Clearance: 55 ml/min (by C-G formula based on Cr of 1.07).  Basename 08/04/11 1539  VANCOTROUGH 23.6*  VANCOPEAK --  Drue Dun --  GENTTROUGH --  GENTPEAK --  GENTRANDOM --  TOBRATROUGH --  TOBRAPEAK --  TOBRARND --  AMIKACINPEAK --  AMIKACINTROU --  AMIKACIN --     Microbiology: Recent Results (from the past 720 hour(s))  SURGICAL PCR SCREEN     Status: Normal   Collection Time   07/19/11 11:45 AM      Component Value Range Status Comment   MRSA, PCR NEGATIVE  NEGATIVE Final    Staphylococcus aureus NEGATIVE  NEGATIVE Final     Anti-infectives     Start     Dose/Rate Route Frequency Ordered Stop   08/02/11 1100   vancomycin (VANCOCIN) IVPB 1000 mg/200 mL premix        1,000 mg 200 mL/hr over 60 Minutes Intravenous Every 12 hours 08/02/11 1019     08/02/11 0600   ceFAZolin (ANCEF) IVPB 2 g/50 mL premix        2 g 100 mL/hr over 30 Minutes Intravenous 30 min pre-op 08/01/11 1509 08/02/11 1223   07/31/11 1800   vancomycin (VANCOCIN) 1,250 mg in sodium chloride 0.9 % 250 mL IVPB  Status:  Discontinued        1,250 mg 166.7 mL/hr over 90 Minutes Intravenous Every 24 hours 07/31/11 1752 08/02/11 1019           Assessment: 80 YOM admitted with gangrenous/necrotic Left foot with osteomyelitis.  He is now s/p L BKA.  He has previous R BKA. Orders for vancomycin for wound and osteomyelitis.  Vancomycin trough = 23.53mcg/ml on 1gm IV q12h. No new post-op weight.    Goal of Therapy:  Vancomycin trough level 10-15 mcg/ml  Plan:  1. Change vancomycin to 1250mg  IV q24h, now that infected foot amputated, no need for higher troughs.  Follow-up duration of therapy.  Dannielle Huh 08/04/2011,4:59 PM

## 2011-08-04 NOTE — Progress Notes (Signed)
Pt didn't have any documpented output from condom cath since 08/03/11. Bladder scan performed. Found possible output 616 ml. Pt didn't complain any fullness or tenderness. In/out cath performed and obtained 200 ml amber colored urine. Pt tolerated well. Will continue to monitor for spontaneous urine output.

## 2011-08-04 NOTE — Progress Notes (Addendum)
08/04/2011 7:45 AM POD 2  Subjective:  No complaints; family states he has not voided.  RN states that the bladder scan last pm showed > 600 cc and I&O cath only with 200 cc out.  VSS  97% RA Filed Vitals:   08/04/11 0516  BP: 122/77  Pulse: 61  Temp: 97 F (36.1 C)  Resp: 18    Physical Exam: Incisions:  C/d/i with staples in tact. Extremities:  Good movement of LLE Abdomen:  Soft NT/ND  CBC    Component Value Date/Time   WBC 8.9 08/03/2011 0525   RBC 3.54* 08/03/2011 0525   HGB 8.8* 08/03/2011 0525   HCT 27.1* 08/03/2011 0525   PLT 257 08/03/2011 0525   MCV 76.6* 08/03/2011 0525   MCH 24.9* 08/03/2011 0525   MCHC 32.5 08/03/2011 0525   RDW 17.2* 08/03/2011 0525   LYMPHSABS 1.0 07/31/2011 1737   MONOABS 0.5 07/31/2011 1737   EOSABS 0.0 07/31/2011 1737   BASOSABS 0.0 07/31/2011 1737    BMET    Component Value Date/Time   NA 136 08/03/2011 0525   K 4.9 08/03/2011 0525   CL 104 08/03/2011 0525   CO2 24 08/03/2011 0525   GLUCOSE 164* 08/03/2011 0525   BUN 23 08/03/2011 0525   CREATININE 0.88 08/03/2011 0525   CALCIUM 8.9 08/03/2011 0525   GFRNONAA 80* 08/03/2011 0525   GFRAA >90 08/03/2011 0525    INR    Component Value Date/Time   INR 1.42 07/31/2011 1737     Intake/Output Summary (Last 24 hours) at 08/04/11 0745 Last data filed at 08/04/11 0227  Gross per 24 hour  Intake 3280.67 ml  Output    200 ml  Net 3080.67 ml     Assessment/Plan:  78 y.o. male is s/p left below knee amputation  POD 2  -Pt with condom cath on -has not voided.  Only 200 cc out with I&O cath last pm. -will have RN do bladder scan and try I&O cath again.  Pt may need foley. -To SNF soon per primary team.  Doreatha Massed, PA-C [facility] [phone] 08/04/2011 7:45 AM  I have examined the patient, reviewed and agree with above.  Daelan Gatt, MD 08/04/2011 2:21 PM

## 2011-08-04 NOTE — Progress Notes (Signed)
Subjective:  Patient denies any chest pain or shortness of breath. States her left leg pain has improved. Surgical site appears to be dry with no evidence of induration or discharge  Objective:  Vital Signs in the last 24 hours: Temp:  [97 F (36.1 C)-98.1 F (36.7 C)] 98.1 F (36.7 C) (07/19 1401) Pulse Rate:  [61-66] 66  (07/19 1401) Resp:  [18] 18  (07/19 1401) BP: (88-122)/(50-77) 94/50 mmHg (07/19 1401) SpO2:  [97 %-100 %] 98 % (07/19 1401)  Intake/Output from previous day: 07/18 0701 - 07/19 0700 In: 3280.7 [P.O.:120; I.V.:3160.7] Out: 200 [Urine:200] Intake/Output from this shift:    Physical Exam: Neck: no adenopathy, no carotid bruit, no JVD and supple, symmetrical, trachea midline Lungs: clear to auscultation bilaterally Heart: regular rate and rhythm, S1, S2 normal and Soft systolic murmur noted no S3 gallop Abdomen: soft, non-tender; bowel sounds normal; no masses,  no organomegaly Extremities: No clubbing cyanosis edema status post left BKA and right BKA in the past  Lab Results:  Basename 08/04/11 0734 08/03/11 0525  WBC 8.0 8.9  HGB 8.7* 8.8*  PLT 246 257    Basename 08/04/11 0734 08/03/11 0525  NA 134* 136  K 4.7 4.9  CL 103 104  CO2 24 24  GLUCOSE 126* 164*  BUN 26* 23  CREATININE 1.07 0.88   No results found for this basename: TROPONINI:2,CK,MB:2 in the last 72 hours Hepatic Function Panel No results found for this basename: PROT,ALBUMIN,AST,ALT,ALKPHOS,BILITOT,BILIDIR,IBILI in the last 72 hours No results found for this basename: CHOL in the last 72 hours No results found for this basename: PROTIME in the last 72 hours  Imaging: Imaging results have been reviewed and No results found.  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

## 2011-08-04 NOTE — Clinical Social Work Psychosocial (Signed)
     Clinical Social Work Department BRIEF PSYCHOSOCIAL ASSESSMENT 08/04/2011  Patient:  Keough,Keene     Account Number:  [id number]     Admit date:  07/31/2011  Clinical Social Worker:  Margaree Mackintosh  Date/Time:  08/04/2011 11:15 AM  Referred by:  Physician  Date Referred:  08/03/2011  Other Referral:   Interview type:  Patient Other interview type:   with sister, brother, and granddtr present.    PSYCHOSOCIAL DATA Living Status:  ALONE Admitted from facility:   Level of care:   Primary support name:  Ada: [phone] Primary support relationship to patient:  SIBLING Degree of support available:   Adequate.    CURRENT CONCERNS Current Concerns  Post-Acute Placement   Other Concerns:    SOCIAL WORK ASSESSMENT / PLAN Clinical Social Worker recieved referral for potential SNF placement.  CSW reviewed chart and met with pt and family at bedside.  CSW introduced self, explained role, and provided support.  CSW reviewed PT recommendations; all in agreement to ST-SNF.  CSW provided SNF list and reviewed SNF process.  CSW to continue to follow and assist as needed.   Assessment/plan status:  Information/Referral to [facility] Other assessment/ plan:   Information/referral to community resources:   SNF    PATIENTS/FAMILYS RESPONSE TO PLAN OF CARE: Pt and family were pleasant and engaged in conversation; thanked CSW for intervention.

## 2011-08-04 NOTE — Progress Notes (Signed)
Rehab admissions - Evaluated for possible admission.  Please see rehab consult done by Dr. Riley Kill on 07/18.  Noted PT and OT recommending SNF.  Patient currently requiring total assist +2 and only contributing 10-20% to his mobility.  At this time, he is more appropriate for SNF level therapies.  Call me for questions.  #[phone]

## 2011-08-04 NOTE — Progress Notes (Signed)
PT Cancellation Note  Treatment cancelled today due to with IV team for new IV.  Will follow as able.  Deeandra Jerry M 08/04/2011, 2:38 PM  08/04/2011 Cephus Shelling, PT, DPT [phone]

## 2011-08-04 NOTE — Clinical Social Work Placement (Addendum)
     Clinical Social Work Department CLINICAL SOCIAL WORK PLACEMENT NOTE 08/09/2011  Patient:  [name],[name]  Account Number:  [id number] Admit date:  07/31/2011  Clinical Social Worker:  Margaree Mackintosh  Date/time:  08/04/2011 11:15 AM  Clinical Social Work is seeking post-discharge placement for this patient at the following level of care:   SKILLED NURSING   (*CSW will update this form in Epic as items are completed)   08/04/2011  Patient/family provided with [facility] Department of Clinical Social Works list of facilities offering this level of care within the geographic area requested by the patient (or if unable, by the patients family).  08/04/2011  Patient/family informed of their freedom to choose among providers that offer the needed level of care, that participate in Medicare, Medicaid or managed care program needed by the patient, have an available bed and are willing to accept the patient.  08/04/2011  Patient/family informed of [facility] ownership interest in [facility], as well as of the fact that they are under no obligation to receive care at this facility.  PASARR submitted to EDS on 08/04/2011 PASARR number received from EDS on 08/08/2011  FL2 transmitted to all facilities in geographic area requested by pt/family on  08/04/2011 FL2 transmitted to all facilities within larger geographic area on   Patient informed that his/her managed care company has contracts with or will negotiate with  certain facilities, including the following:     Patient/family informed of bed offers received:  08/09/2011 Patient chooses bed at [facility] Physician recommends and patient chooses bed at    Patient to be transferred to [facility] on  08/09/2011 Patient to be transferred to facility by EMS  The following physician request were entered in Epic:   Additional Comments:

## 2011-08-04 NOTE — Progress Notes (Signed)
Speech Language Pathology Dysphagia Treatment Patient Details Name: [name] MRN: [id number] DOB: 10/16/1931 Today's Date: 08/04/2011 Time: 0102-7253 SLP Time Calculation (min): 15 min  Assessment / Plan / Recommendation Clinical Impression  Pt functionally consuming soft solids though intake remains slow with pt requiring min verbal cues to sustain attention to task. Pt observed to have prolonged mastication and transit, though improved with this soft texture in comparison with cracker yesterday. Pt also observed to have some delayed throat clear at the end of meal with solid foods, as well as belching. Pt admits to occasional globus sensation. SLP recommended esophageal precautions and discussion with MD regarding symptoms of reflux. Pt and family are unsure if pt takes medication for indigestion PTA. Pt return demonstrated, family expressed understanding of strategies. No SLP f/u neede at this time. Will sign off, continue current diet.     Diet Recommendation  Continue with Current Diet: Dysphagia 2 (fine chop);Thin liquid    SLP Plan All goals met;Discharge SLP treatment due to (comment)   Pertinent Vitals/Pain NA   Swallowing Goals  SLP Swallowing Goals Patient will consume recommended diet without observed clinical signs of aspiration with: Independent assistance Swallow Study Goal #1 - Progress: Met Patient will utilize recommended strategies during swallow to increase swallowing safety with: Independent assistance Swallow Study Goal #2 - Progress: Met  General Temperature Spikes Noted: No Respiratory Status: Room air Behavior/Cognition: Alert;Cooperative;Decreased sustained attention Oral Cavity - Dentition: Edentulous Patient Positioning: Upright in bed  Oral Cavity - Oral Hygiene     Dysphagia Treatment Treatment focused on: Skilled observation of diet tolerance;Patient/family/caregiver education;Utilization of compensatory strategies Family/Caregiver Educated: wife,  son Treatment Methods/Modalities: Skilled observation;Differential diagnosis Patient observed directly with PO's: Yes Type of PO's observed: Dysphagia 2 (chopped);Thin liquids Feeding: Able to feed self Liquids provided via: Cup;Straw Oral Phase Signs & Symptoms: Prolonged oral phase;Prolonged bolus formation;Prolonged mastication Pharyngeal Phase Signs & Symptoms: Delayed throat clear Type of cueing: Verbal Amount of cueing: Minimal   GO     Echo Propp, Riley Nearing 08/04/2011, 8:59 AM

## 2011-08-05 LAB — GLUCOSE, CAPILLARY: Glucose-Capillary: 165 mg/dL — ABNORMAL HIGH (ref 70–99)

## 2011-08-05 LAB — BASIC METABOLIC PANEL
BUN: 35 mg/dL — ABNORMAL HIGH (ref 6–23)
Chloride: 99 mEq/L (ref 96–112)
Glucose, Bld: 128 mg/dL — ABNORMAL HIGH (ref 70–99)
Potassium: 5.6 mEq/L — ABNORMAL HIGH (ref 3.5–5.1)

## 2011-08-05 MED ORDER — SODIUM CHLORIDE 0.9 % IV SOLN
INTRAVENOUS | Status: DC
  Administered 2011-08-05 – 2011-08-06 (×2): via INTRAVENOUS

## 2011-08-05 MED ORDER — SORBITOL 70 % SOLN
30.0000 mL | Freq: Two times a day (BID) | Status: DC
  Administered 2011-08-05 – 2011-08-09 (×7): 30 mL via ORAL
  Filled 2011-08-05 (×9): qty 30

## 2011-08-05 NOTE — Progress Notes (Signed)
Subjective:  Patient denies any new complaints. Nursing reports patient passing small hard stools after lactulose. Patient's oral intake isl poor. He tends to slide to the right side of the bed and becomes uncomfortable.   No Known Allergies Current Facility-Administered Medications  Medication Dose Route Frequency Provider Last Rate Last Dose  . 0.9 %  sodium chloride infusion   Intravenous Continuous Robynn Pane, MD 20 mL/hr at 08/03/11 1514    . 0.9 %  sodium chloride infusion  250 mL Intravenous PRN Marlowe Shores, PA      . acetaminophen (TYLENOL) tablet 325-650 mg  325-650 mg Oral Q4H PRN Marlowe Shores, PA       Or  . acetaminophen (TYLENOL) suppository 325-650 mg  325-650 mg Rectal Q4H PRN Marlowe Shores, PA      . alum & mag hydroxide-simeth (MAALOX/MYLANTA) 200-200-20 MG/5ML suspension 15-30 mL  15-30 mL Oral Q2H PRN Marlowe Shores, PA      . aspirin EC tablet 325 mg  325 mg Oral Daily Robynn Pane, MD   325 mg at 08/04/11 0950  . bisacodyl (DULCOLAX) suppository 10 mg  10 mg Rectal Daily PRN Marlowe Shores, PA   10 mg at 08/04/11 2204  . carvedilol (COREG) tablet 6.25 mg  6.25 mg Oral BID WC Robynn Pane, MD   6.25 mg at 08/04/11 1616  . clopidogrel (PLAVIX) tablet 75 mg  75 mg Oral Daily Amelia Jo [place], [place]   75 mg at 08/04/11 0950  . docusate sodium (COLACE) capsule 100 mg  100 mg Oral Daily Marlowe Shores, [place]   100 mg at 08/04/11 0950  . hydrALAZINE (APRESOLINE) injection 10 mg  10 mg Intravenous Q2H PRN Marlowe Shores, [place]      . HYDROcodone-acetaminophen (NORCO) 5-325 MG per tablet 1-2 tablet  1-2 tablet Oral Q4H PRN Robynn Pane, MD   2 tablet at 08/04/11 2342  . insulin aspart (novoLOG) injection 0-9 Units  0-9 Units Subcutaneous TID WC Robynn Pane, MD   2 Units at 08/05/11 0630  . isosorbide mononitrate (IMDUR) 24 hr tablet 60 mg  60 mg Oral Daily Robynn Pane, MD   60 mg at 08/04/11 0950  . labetalol (NORMODYNE,TRANDATE) injection  10 mg  10 mg Intravenous Q2H PRN Marlowe Shores, PA      . lactated ringers infusion   Intravenous Continuous Larina Earthly, MD      . metoprolol (LOPRESSOR) injection 2-5 mg  2-5 mg Intravenous Q2H PRN Marlowe Shores, [place]      . morphine 2 MG/ML injection 2 mg  2 mg Intravenous Q2H PRN Lars Mage, PA   2 mg at 08/02/11 1029  . morphine 2 MG/ML injection 2-5 mg  2-5 mg Intravenous Q1H PRN Marlowe Shores, PA   4 mg at 08/02/11 1542  . ondansetron (ZOFRAN) injection 4 mg  4 mg Intravenous Q6H PRN Marlowe Shores, PA      . pantoprazole (PROTONIX) EC tablet 40 mg  40 mg Oral Q0600 Robynn Pane, MD   40 mg at 08/05/11 0625  . phenol (CHLORASEPTIC) mouth spray 1 spray  1 spray Mouth/Throat PRN Marlowe Shores, PA      . polyethylene glycol (MIRALAX / GLYCOLAX) packet 17 g  17 g Oral Daily PRN Marlowe Shores, PA      . ramipril (ALTACE) capsule 5 mg  5 mg Oral Daily Robynn Pane,  MD   5 mg at 08/04/11 0950  . sodium chloride 0.9 % injection 3 mL  3 mL Intravenous Q12H Marlowe Shores, PA   3 mL at 08/04/11 0950  . sodium chloride 0.9 % injection 3 mL  3 mL Intravenous PRN Amelia Jo Roczniak, PA      . sorbitol 70 % solution 30 mL  30 mL Oral Daily PRN Robynn Pane, MD   30 mL at 08/04/11 2203  . vancomycin (VANCOCIN) 1,250 mg in sodium chloride 0.9 % 250 mL IVPB  1,250 mg Intravenous Q24H Dannielle Huh, PHARMD      . DISCONTD: vancomycin (VANCOCIN) IVPB 1000 mg/200 mL premix  1,000 mg Intravenous Q12H Fredrik Rigger, PHARMD   1,000 mg at 08/04/11 1616    Objective: Blood pressure 93/69, pulse 62, temperature 97.6 F (36.4 C), temperature source Oral, resp. rate 16, height 5\' 8"  (1.727 m), weight 158 lb 11.7 oz (72 kg), SpO2 95.00%.  WD elderly black male in no acuste distress. HEENT:no sinus tenderness. NECK:no posterior cervical nodes. LUNGS:decreased breath sounds at bases. No wheezes. CV: normal S1, S2 wihtout S3. ABD:no tenderness.  ZOX:WRUE BKA  sutures intact. NO drainage. NEURO:? Mild left central 7. Corrects with smiling. Grasp symmetric.  Lab results: Results for orders placed during the hospital encounter of 07/31/11 (from the past 48 hour(s))  GLUCOSE, CAPILLARY     Status: Abnormal   Collection Time   08/03/11  4:28 PM      Component Value Range Comment   Glucose-Capillary 163 (*) 70 - 99 mg/dL    Comment 1 Documented in Chart      Comment 2 Notify RN     GLUCOSE, CAPILLARY     Status: Abnormal   Collection Time   08/03/11  9:37 PM      Component Value Range Comment   Glucose-Capillary 101 (*) 70 - 99 mg/dL    Comment 1 Documented in Chart      Comment 2 Notify RN     GLUCOSE, CAPILLARY     Status: Abnormal   Collection Time   08/04/11  5:54 AM      Component Value Range Comment   Glucose-Capillary 107 (*) 70 - 99 mg/dL    Comment 1 Documented in Chart      Comment 2 Notify RN     BASIC METABOLIC PANEL     Status: Abnormal   Collection Time   08/04/11  7:34 AM      Component Value Range Comment   Sodium 134 (*) 135 - 145 mEq/L    Potassium 4.7  3.5 - 5.1 mEq/L    Chloride 103  96 - 112 mEq/L    CO2 24  19 - 32 mEq/L    Glucose, Bld 126 (*) 70 - 99 mg/dL    BUN 26 (*) 6 - 23 mg/dL    Creatinine, Ser 4.54  0.50 - 1.35 mg/dL    Calcium 8.9  8.4 - 09.8 mg/dL    GFR calc non Af Amer 64 (*) >90 mL/min    GFR calc Af Amer 75 (*) >90 mL/min   CBC     Status: Abnormal   Collection Time   08/04/11  7:34 AM      Component Value Range Comment   WBC 8.0  4.0 - 10.5 K/uL    RBC 3.50 (*) 4.22 - 5.81 MIL/uL    Hemoglobin 8.7 (*) 13.0 - 17.0 g/dL    HCT 11.9 (*)  39.0 - 52.0 %    MCV 76.0 (*) 78.0 - 100.0 fL    MCH 24.9 (*) 26.0 - 34.0 pg    MCHC 32.7  30.0 - 36.0 g/dL    RDW 16.1 (*) 09.6 - 15.5 %    Platelets 246  150 - 400 K/uL   GLUCOSE, CAPILLARY     Status: Abnormal   Collection Time   08/04/11 11:24 AM      Component Value Range Comment   Glucose-Capillary 187 (*) 70 - 99 mg/dL    Comment 1 Documented in Chart       Comment 2 Notify RN     URINALYSIS, ROUTINE W REFLEX MICROSCOPIC     Status: Abnormal   Collection Time   08/04/11 11:54 AM      Component Value Range Comment   Color, Urine AMBER (*) YELLOW BIOCHEMICALS MAY BE AFFECTED BY COLOR   APPearance CLOUDY (*) CLEAR    Specific Gravity, Urine 1.036 (*) 1.005 - 1.030    pH 5.0  5.0 - 8.0    Glucose, UA NEGATIVE  NEGATIVE mg/dL    Hgb urine dipstick NEGATIVE  NEGATIVE    Bilirubin Urine SMALL (*) NEGATIVE    Ketones, ur NEGATIVE  NEGATIVE mg/dL    Protein, ur NEGATIVE  NEGATIVE mg/dL    Urobilinogen, UA 1.0  0.0 - 1.0 mg/dL    Nitrite NEGATIVE  NEGATIVE    Leukocytes, UA NEGATIVE  NEGATIVE MICROSCOPIC NOT DONE ON URINES WITH NEGATIVE PROTEIN, BLOOD, LEUKOCYTES, NITRITE, OR GLUCOSE <1000 mg/dL.  VANCOMYCIN, TROUGH     Status: Abnormal   Collection Time   08/04/11  3:39 PM      Component Value Range Comment   Vancomycin Tr 23.6 (*) 10.0 - 20.0 ug/mL   GLUCOSE, CAPILLARY     Status: Abnormal   Collection Time   08/04/11  4:35 PM      Component Value Range Comment   Glucose-Capillary 153 (*) 70 - 99 mg/dL    Comment 1 Documented in Chart      Comment 2 Notify RN     GLUCOSE, CAPILLARY     Status: Abnormal   Collection Time   08/04/11  9:37 PM      Component Value Range Comment   Glucose-Capillary 137 (*) 70 - 99 mg/dL   GLUCOSE, CAPILLARY     Status: Abnormal   Collection Time   08/05/11  6:17 AM      Component Value Range Comment   Glucose-Capillary 166 (*) 70 - 99 mg/dL     Studies/Results: No results found.  Patient Active Problem List  Diagnosis  . DM  . HYPERCHOLESTEROLEMIA  . HYPERTENSION  . Complete heart block  . Critical lower limb ischemia  . Atherosclerosis of native arteries of the extremities with ulceration    Impression: S/P left BKA Colonic dysfunction. Variable oral intake. DM with stable CBG's. CHB history.   Plan: Continue support. Increase IV fluids. Increase lactulose until BM's. Check ammonia  level.   Dewayne Severe 08/05/2011 11:00 AM

## 2011-08-05 NOTE — Plan of Care (Signed)
Problem: Phase II Progression Outcomes Goal: Tolerating diet Outcome: Not Met (add Reason) Dec. appetite

## 2011-08-05 NOTE — Progress Notes (Signed)
MD paged and notified, Pt still has not voided, MD aware pt has not voided all day. Bladder scan completed with 403 ml present. MD Gave orders NOT to I/O cath pt. Orders given for stat BMET.  Will continuing monitoring pt. MD also aware of Pt's poor po intake. No other orders given at this time.

## 2011-08-05 NOTE — Progress Notes (Signed)
Patient c/o I need help making my bowels move. R.n. Aware sorbitol p.o. Given and a Doculax Supp. Given. Stool is present at the opening of rectum. It is hard and form. Patient is having some diff. Passing stool and is uncomfort. L.b.m. 07-29-11. With in one hour patient is getting stool out with little help. He is impacted. He was able to get out several hard round balls of stool . Still has more to come. Will let pt. Rest for now. Enc fluilds. Pt. Not eating or drinking much. R.n. Aware.bladder scan him and only 97 ml of urine. R.n. aware

## 2011-08-05 NOTE — Progress Notes (Signed)
MD paged and notified that pt has had poor PO intake this am and still has not voided. Bladder scan performed, 150 ml present on bladder scan. MD aware. Orders given to increase fluids, CBC/BMET in am, and to monitor pt.

## 2011-08-06 LAB — BASIC METABOLIC PANEL
BUN: 33 mg/dL — ABNORMAL HIGH (ref 6–23)
CO2: 22 mEq/L (ref 19–32)
Calcium: 8.6 mg/dL (ref 8.4–10.5)
Glucose, Bld: 126 mg/dL — ABNORMAL HIGH (ref 70–99)
Potassium: 5.4 mEq/L — ABNORMAL HIGH (ref 3.5–5.1)
Sodium: 133 mEq/L — ABNORMAL LOW (ref 135–145)

## 2011-08-06 LAB — GLUCOSE, CAPILLARY: Glucose-Capillary: 131 mg/dL — ABNORMAL HIGH (ref 70–99)

## 2011-08-06 LAB — CBC
HCT: 24.6 % — ABNORMAL LOW (ref 39.0–52.0)
Hemoglobin: 8.2 g/dL — ABNORMAL LOW (ref 13.0–17.0)
MCH: 25.2 pg — ABNORMAL LOW (ref 26.0–34.0)
RBC: 3.26 MIL/uL — ABNORMAL LOW (ref 4.22–5.81)

## 2011-08-06 MED ORDER — SODIUM POLYSTYRENE SULFONATE 15 GM/60ML PO SUSP
30.0000 g | Freq: Once | ORAL | Status: AC
  Administered 2011-08-06: 30 g via ORAL
  Filled 2011-08-06: qty 120

## 2011-08-06 MED ORDER — DEXTROSE-NACL 5-0.45 % IV SOLN
INTRAVENOUS | Status: DC
  Administered 2011-08-06 – 2011-08-07 (×3): via INTRAVENOUS

## 2011-08-06 NOTE — Progress Notes (Signed)
Subjective:  Patient without new complaints. Still has only had small amount of bowel movement. Appetite without significant change. Mild confusion at times to place and time. Patient did begin to void after IV fluid replacement.   No Known Allergies Current Facility-Administered Medications  Medication Dose Route Frequency Provider Last Rate Last Dose  . 0.9 %  sodium chloride infusion  250 mL Intravenous PRN Marlowe Shores, PA 20 mL/hr at 08/05/11 0830 250 mL at 08/05/11 0830  . 0.9 %  sodium chloride infusion   Intravenous Continuous Robynn Pane, MD 75 mL/hr at 08/06/11 0810    . acetaminophen (TYLENOL) tablet 325-650 mg  325-650 mg Oral Q4H PRN Marlowe Shores, PA       Or  . acetaminophen (TYLENOL) suppository 325-650 mg  325-650 mg Rectal Q4H PRN Marlowe Shores, PA      . alum & mag hydroxide-simeth (MAALOX/MYLANTA) 200-200-20 MG/5ML suspension 15-30 mL  15-30 mL Oral Q2H PRN Marlowe Shores, PA      . aspirin EC tablet 325 mg  325 mg Oral Daily Robynn Pane, MD   325 mg at 08/05/11 1110  . bisacodyl (DULCOLAX) suppository 10 mg  10 mg Rectal Daily PRN Marlowe Shores, PA   10 mg at 08/04/11 2204  . carvedilol (COREG) tablet 6.25 mg  6.25 mg Oral BID WC Robynn Pane, MD   6.25 mg at 08/06/11 0645  . docusate sodium (COLACE) capsule 100 mg  100 mg Oral Daily Amelia Jo [place], [place]   100 mg at 08/05/11 1116  . hydrALAZINE (APRESOLINE) injection 10 mg  10 mg Intravenous Q2H PRN Marlowe Shores, [place]      . HYDROcodone-acetaminophen (NORCO) 5-325 MG per tablet 1-2 tablet  1-2 tablet Oral Q4H PRN Robynn Pane, MD   2 tablet at 08/04/11 2342  . insulin aspart (novoLOG) injection 0-9 Units  0-9 Units Subcutaneous TID WC Robynn Pane, MD   1 Units at 08/06/11 [phone]  . isosorbide mononitrate (IMDUR) 24 hr tablet 60 mg  60 mg Oral Daily Robynn Pane, MD   60 mg at 08/05/11 1110  . labetalol (NORMODYNE,TRANDATE) injection 10 mg  10 mg Intravenous Q2H PRN Marlowe Shores, PA      . lactated ringers infusion   Intravenous Continuous Larina Earthly, MD      . metoprolol (LOPRESSOR) injection 2-5 mg  2-5 mg Intravenous Q2H PRN Marlowe Shores, [place]      . morphine 2 MG/ML injection 2 mg  2 mg Intravenous Q2H PRN Lars Mage, PA   2 mg at 08/02/11 1029  . morphine 2 MG/ML injection 2-5 mg  2-5 mg Intravenous Q1H PRN Marlowe Shores, PA   4 mg at 08/02/11 1542  . ondansetron (ZOFRAN) injection 4 mg  4 mg Intravenous Q6H PRN Marlowe Shores, PA      . pantoprazole (PROTONIX) EC tablet 40 mg  40 mg Oral Q0600 Robynn Pane, MD   40 mg at 08/06/11 0645  . phenol (CHLORASEPTIC) mouth spray 1 spray  1 spray Mouth/Throat PRN Marlowe Shores, PA      . polyethylene glycol (MIRALAX / GLYCOLAX) packet 17 g  17 g Oral Daily PRN Marlowe Shores, PA   17 g at 08/05/11 1118  . ramipril (ALTACE) capsule 5 mg  5 mg Oral Daily Robynn Pane, MD   5 mg at 08/04/11 0950  . sodium chloride 0.9 %  injection 3 mL  3 mL Intravenous Q12H Amelia Jo [place], [place]   3 mL at 08/04/11 0950  . sodium chloride 0.9 % injection 3 mL  3 mL Intravenous PRN Amelia Jo Roczniak, PA      . sorbitol 70 % solution 30 mL  30 mL Oral BID Gwenyth Bender, MD   30 mL at 08/05/11 2117  . vancomycin (VANCOCIN) 1,250 mg in sodium chloride 0.9 % 250 mL IVPB  1,250 mg Intravenous Q24H Dannielle Huh, PHARMD   1,250 mg at 08/05/11 1633  . DISCONTD: 0.9 %  sodium chloride infusion   Intravenous Continuous Robynn Pane, MD 20 mL/hr at 08/03/11 1514      Objective: Blood pressure 123/72, pulse 79, temperature 97.8 F (36.6 C), temperature source Oral, resp. rate 18, height 5\' 8"  (1.727 m), weight 158 lb 11.7 oz (72 kg), SpO2 100.00%.  Well-developed black male in no acute distress. Alert. HEENT: No sinus tenderness. NECK: No posterior cervical nodes. LUNGS: Clear to auscultation without rales appreciated. No CVA tenderness. CV: Normal S1, S2 without S3. ABD: Mild distention. Dullness in  left lower quadrant. Tympanic and right lower quadrant and right upper quadrant. MSK: Left BKA stump healing well. Sutures intact. Right BKA old and stable. NEURO: Nonfocal.  Lab results: Results for orders placed during the hospital encounter of 07/31/11 (from the past 48 hour(s))  VANCOMYCIN, TROUGH     Status: Abnormal   Collection Time   08/04/11  3:39 PM      Component Value Range Comment   Vancomycin Tr 23.6 (*) 10.0 - 20.0 ug/mL   GLUCOSE, CAPILLARY     Status: Abnormal   Collection Time   08/04/11  4:35 PM      Component Value Range Comment   Glucose-Capillary 153 (*) 70 - 99 mg/dL    Comment 1 Documented in Chart      Comment 2 Notify RN     GLUCOSE, CAPILLARY     Status: Abnormal   Collection Time   08/04/11  9:37 PM      Component Value Range Comment   Glucose-Capillary 137 (*) 70 - 99 mg/dL   GLUCOSE, CAPILLARY     Status: Abnormal   Collection Time   08/05/11  6:17 AM      Component Value Range Comment   Glucose-Capillary 166 (*) 70 - 99 mg/dL   GLUCOSE, CAPILLARY     Status: Abnormal   Collection Time   08/05/11 11:11 AM      Component Value Range Comment   Glucose-Capillary 186 (*) 70 - 99 mg/dL    Comment 1 Notify RN     GLUCOSE, CAPILLARY     Status: Abnormal   Collection Time   08/05/11  4:13 PM      Component Value Range Comment   Glucose-Capillary 165 (*) 70 - 99 mg/dL    Comment 1 Notify RN     BASIC METABOLIC PANEL     Status: Abnormal   Collection Time   08/05/11  7:45 PM      Component Value Range Comment   Sodium 132 (*) 135 - 145 mEq/L    Potassium 5.6 (*) 3.5 - 5.1 mEq/L    Chloride 99  96 - 112 mEq/L    CO2 23  19 - 32 mEq/L    Glucose, Bld 128 (*) 70 - 99 mg/dL    BUN 35 (*) 6 - 23 mg/dL    Creatinine, Ser 1.61  0.50 -  1.35 mg/dL    Calcium 8.7  8.4 - 16.1 mg/dL    GFR calc non Af Amer 54 (*) >90 mL/min    GFR calc Af Amer 62 (*) >90 mL/min   GLUCOSE, CAPILLARY     Status: Abnormal   Collection Time   08/05/11  9:37 PM      Component Value  Range Comment   Glucose-Capillary 158 (*) 70 - 99 mg/dL    Comment 1 Notify RN     CBC     Status: Abnormal   Collection Time   08/06/11  5:45 AM      Component Value Range Comment   WBC 7.0  4.0 - 10.5 K/uL    RBC 3.26 (*) 4.22 - 5.81 MIL/uL    Hemoglobin 8.2 (*) 13.0 - 17.0 g/dL    HCT 09.6 (*) 04.5 - 52.0 %    MCV 75.5 (*) 78.0 - 100.0 fL    MCH 25.2 (*) 26.0 - 34.0 pg    MCHC 33.3  30.0 - 36.0 g/dL    RDW 40.9 (*) 81.1 - 15.5 %    Platelets 241  150 - 400 K/uL   BASIC METABOLIC PANEL     Status: Abnormal   Collection Time   08/06/11  5:45 AM      Component Value Range Comment   Sodium 133 (*) 135 - 145 mEq/L    Potassium 5.4 (*) 3.5 - 5.1 mEq/L    Chloride 102  96 - 112 mEq/L    CO2 22  19 - 32 mEq/L    Glucose, Bld 126 (*) 70 - 99 mg/dL    BUN 33 (*) 6 - 23 mg/dL    Creatinine, Ser 9.14  0.50 - 1.35 mg/dL    Calcium 8.6  8.4 - 78.2 mg/dL    GFR calc non Af Amer 62 (*) >90 mL/min    GFR calc Af Amer 71 (*) >90 mL/min   GLUCOSE, CAPILLARY     Status: Abnormal   Collection Time   08/06/11  6:06 AM      Component Value Range Comment   Glucose-Capillary 131 (*) 70 - 99 mg/dL    Comment 1 Notify RN     GLUCOSE, CAPILLARY     Status: Abnormal   Collection Time   08/06/11 11:33 AM      Component Value Range Comment   Glucose-Capillary 149 (*) 70 - 99 mg/dL    Comment 1 Notify RN       Studies/Results: No results found.  Patient Active Problem List  Diagnosis  . DM  . HYPERCHOLESTEROLEMIA  . HYPERTENSION  . Complete heart block  . Critical lower limb ischemia  . Atherosclerosis of native arteries of the extremities with ulceration    Impression: Status post left BKA with good postop course. Colonic dysfunction with slow improvement. Hyperkalemia. Tenderness is slowly decreasing. No exogenous potassium at this time. Variable oral intake continues. Diabetes mellitus. Hypertension history.   Plan: Continue lactulose twice a day. Change IV to D5 half-normal  saline. Continue monitoring his CBGs. Continuing care per surgery. Followup in a.m.   August Saucer, Zyanya Glaza 08/06/2011 11:57 AM

## 2011-08-06 NOTE — Progress Notes (Signed)
CSW attempted to provide bed offers to pt's family: Ada [phone], no answer; Thomasenia Sales [phone], no answer; Aram Beecham [phone], requested CSW leave offers in pt's room and pt's sister, Thomasenia Sales, will review.  Bed offers left in room where pt was sleeping.  Weekday CSW to f/u re choice.  Dellie Burns, MSW, [place] [phone] (weekend)

## 2011-08-07 LAB — CBC
MCH: 24.8 pg — ABNORMAL LOW (ref 26.0–34.0)
MCHC: 32.6 g/dL (ref 30.0–36.0)
MCV: 76.1 fL — ABNORMAL LOW (ref 78.0–100.0)
Platelets: 244 10*3/uL (ref 150–400)
RBC: 3.43 MIL/uL — ABNORMAL LOW (ref 4.22–5.81)

## 2011-08-07 LAB — GLUCOSE, CAPILLARY: Glucose-Capillary: 151 mg/dL — ABNORMAL HIGH (ref 70–99)

## 2011-08-07 MED ORDER — GLUCERNA SHAKE PO LIQD
237.0000 mL | Freq: Two times a day (BID) | ORAL | Status: DC
  Administered 2011-08-07 – 2011-08-09 (×4): 237 mL via ORAL

## 2011-08-07 MED ORDER — PRO-STAT SUGAR FREE PO LIQD
30.0000 mL | Freq: Two times a day (BID) | ORAL | Status: DC
  Administered 2011-08-07 – 2011-08-09 (×4): 30 mL via ORAL
  Filled 2011-08-07 (×5): qty 30

## 2011-08-07 NOTE — Progress Notes (Signed)
Physical Therapy Treatment Patient Details Name: [name] MRN: [id number] DOB: [birth year] Today's Date: 08/07/2011 Time: 4098-1191 PT Time Calculation (min): 16 min  PT Assessment / Plan / Recommendation Comments on Treatment Session  Pt presents with significant UE weakness which limits functional mobility.  Will attempt squat pivot transfer next session with prosthetic R LE.      Follow Up Recommendations  Skilled nursing facility    Barriers to Discharge        Equipment Recommendations  Defer to next venue    Recommendations for Other Services    Frequency Min 3X/week   Plan Discharge plan remains appropriate;Frequency remains appropriate    Precautions / Restrictions Precautions Precautions: Fall Precaution Comments: history of R BKA Restrictions Weight Bearing Restrictions: Yes LLE Weight Bearing: Non weight bearing   Pertinent Vitals/Pain Pt c/o pain in L LE unable to rate.     Mobility  Bed Mobility Bed Mobility: Rolling Right;Right Sidelying to Sit Rolling Right: 3: Mod assist Rolling Left: Not tested (comment) Right Sidelying to Sit: 2: Max assist Sitting - Scoot to Edge of Bed: 1: +2 Total assist Sitting - Scoot to Edge of Bed: Patient Percentage: 20% Sit to Supine: Not Tested (comment) Details for Bed Mobility Assistance: assist to transition trunk from supine to sidelying.  Assist to raise shoulder from bed.  Step by step cueing required.   Transfers Transfers: Risk manager: 1: +2 Total assist;To level surface Anterior-Posterior Transfers: Patient Percentage: 20% Details for Transfer Assistance: Pt unable to manage moving his body weight secondary to Bilateral UE weakness.  PT physically placed bilateral hands on armrest of chair to initiate posterior slide transfer.  Pt unable to keep hands on chair without assistance.   Ambulation/Gait Ambulation/Gait Assistance: Not tested (comment) Wheelchair  Mobility Wheelchair Mobility: No    Exercises     PT Diagnosis:    PT Problem List:   PT Treatment Interventions:     PT Goals Acute Rehab PT Goals PT Goal Formulation: With patient Time For Goal Achievement: 08/17/11 Potential to Achieve Goals: Fair Pt will Roll Supine to Right Side: with min assist PT Goal: Rolling Supine to Right Side - Progress: Progressing toward goal Pt will Roll Supine to Left Side: with min assist PT Goal: Rolling Supine to Left Side - Progress: Progressing toward goal Pt will go Supine/Side to Sit: with mod assist PT Goal: Supine/Side to Sit - Progress: Progressing toward goal Pt will Sit at Edge of Bed: with min assist;3-5 min;with bilateral upper extremity support PT Goal: Sit at Edge Of Bed - Progress: Progressing toward goal Pt will go Sit to Supine/Side: with mod assist PT Goal: Sit to Supine/Side - Progress: Progressing toward goal Pt will Transfer Bed to Chair/Chair to Bed: with mod assist PT Transfer Goal: Bed to Chair/Chair to Bed - Progress: Progressing toward goal  Visit Information  Last PT Received On: 08/07/11 Assistance Needed: +2    Subjective Data  Subjective: pt presents with very little verbal communication.    Cognition  Overall Cognitive Status: Impaired Arousal/Alertness: Lethargic Behavior During Session: Lethargic    Balance  Balance Balance Assessed: Yes Static Sitting Balance Static Sitting - Balance Support: Bilateral upper extremity supported Static Sitting - Level of Assistance: 3: Mod assist Static Sitting - Comment/# of Minutes: Sat on EOB 3+ minutes with assist to prevent posterior lean. Cueing pt through hips most effective.    End of Session PT - End of Session Equipment Utilized During Treatment: Gait belt  Activity Tolerance: Patient limited by fatigue;Patient limited by pain Patient left: in chair;with call bell/phone within reach Nurse Communication: Mobility status;Need for lift equipment   GP      Minoru Chap 08/07/2011, 12:14 PM Josue Falconi L. Kawhi Diebold DPT [phone]

## 2011-08-07 NOTE — Progress Notes (Signed)
Covering Clinical Child psychotherapist (CSW) contacted pt sister Thomasenia Sales regarding bed offers. Thomasenia Sales stated she picked up the list with bed offers from pt room today, will make copies for the rest of the family and contact CSW by the end of the day today or tomorrow morning with their preference. Thomasenia Sales aware that CIR is still evaluating pt and that SNF is a back up option. CSW to continue following and facilitate with dc planning.  Theresia Bough, MSW, Theresia Majors [phone]

## 2011-08-07 NOTE — Progress Notes (Signed)
Subjective:  Patient denies any chest pain or shortness of breath. Family has not decided yet regarding skilled nursing facility he will go to  Objective:  Vital Signs in the last 24 hours: Temp:  [98.3 F (36.8 C)-98.4 F (36.9 C)] 98.4 F (36.9 C) (07/22 0439) Pulse Rate:  [67-70] 67  (07/22 0439) Resp:  [16-18] 18  (07/22 0439) BP: (116-130)/(65-81) 130/81 mmHg (07/22 0439) SpO2:  [90 %-94 %] 90 % (07/22 0439)  Intake/Output from previous day: 07/21 0701 - 07/22 0700 In: 240 [P.O.:240] Out: 450 [Urine:450] Intake/Output from this shift: Total I/O In: 40 [P.O.:40] Out: -   Physical Exam: Neck: no adenopathy, no carotid bruit, no JVD and supple, symmetrical, trachea midline Lungs: Decreased breath sound at bases Heart: regular rate and rhythm, S1, S2 normal and Soft systolic murmur noted Abdomen: soft, non-tender; bowel sounds normal; no masses,  no organomegaly Extremities: Bilateral BKA  Lab Results:  Basename 08/07/11 0505 08/06/11 0545  WBC 6.5 7.0  HGB 8.5* 8.2*  PLT 244 241    Basename 08/06/11 0545 08/05/11 1945  NA 133* 132*  K 5.4* 5.6*  CL 102 99  CO2 22 23  GLUCOSE 126* 128*  BUN 33* 35*  CREATININE 1.11 1.24   No results found for this basename: TROPONINI:2,CK,MB:2 in the last 72 hours Hepatic Function Panel No results found for this basename: PROT,ALBUMIN,AST,ALT,ALKPHOS,BILITOT,BILIDIR,IBILI in the last 72 hours No results found for this basename: CHOL in the last 72 hours No results found for this basename: PROTIME in the last 72 hours  Imaging: Imaging results have been reviewed and No results found.  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

## 2011-08-07 NOTE — Progress Notes (Signed)
INITIAL ADULT NUTRITION ASSESSMENT Date: 08/07/2011   Time: 3:03 PM  INTERVENTION:  Please obtain post-op weight  Glucerna Shake twice daily (220 kcals, 9.9 gm protein per 8 fl oz can)  Prostat liquid protein 30 ml twice daily (100 kcals, 15 gm protein per dose) RD to follow for nutrition care plan  Reason for Assessment: Low Braden  ASSESSMENT: Male 78 y.o.  Dx:  Past Medical History  Diagnosis Date  . Hypercholesteremia     takes Lipitor daily  . CAD (coronary artery disease)     s/p PTCA to PLV branch in 2002  . Non-insulin dependent diabetes mellitus   . PVD (peripheral vascular disease)     s/p right BKA  . CVA (cerebral infarction)     s/p PTA to middle cerebral artery;slurred speech  . HTN (hypertension)     takes Benazepril daily  . Pacemaker   . Shortness of breath     sitting as well as standing  . Glaucoma   . Complete heart block     s/p Medtronic PPM by JA 09/2009  . Stroke     Hx:  Past Medical History  Diagnosis Date  . Hypercholesteremia     takes Lipitor daily  . CAD (coronary artery disease)     s/p PTCA to PLV branch in 2002  . Non-insulin dependent diabetes mellitus   . PVD (peripheral vascular disease)     s/p right BKA  . CVA (cerebral infarction)     s/p PTA to middle cerebral artery;slurred speech  . HTN (hypertension)     takes Benazepril daily  . Pacemaker   . Shortness of breath     sitting as well as standing  . Glaucoma   . Complete heart block     s/p Medtronic PPM by JA 09/2009  . Stroke     Related Meds:     . aspirin EC  325 mg Oral Daily  . carvedilol  6.25 mg Oral BID WC  . docusate sodium  100 mg Oral Daily  . insulin aspart  0-9 Units Subcutaneous TID WC  . isosorbide mononitrate  60 mg Oral Daily  . pantoprazole  40 mg Oral Q0600  . ramipril  5 mg Oral Daily  . sodium chloride  3 mL Intravenous Q12H  . sodium polystyrene  30 g Oral Once  . sorbitol  30 mL Oral BID  . DISCONTD: vancomycin  1,250 mg  Intravenous Q24H    Ht: 5\' 8"  (172.7 cm)  Wt: 158 lb 11.7 oz (72 kg)  Ideal Wt: 65.8 kg -- adjusted for BKA; pre-surgical weight % Ideal Wt: 109%  Usual Wt: unable to obain % Usual Wt: ---  BMI = 25.6 kg/m2 -- adjusted for BKA  Food/Nutrition Related Hx: no triggers per admission nutrition screen  Labs:  CMP     Component Value Date/Time   NA 133* 08/06/2011 0545   K 5.4* 08/06/2011 0545   CL 102 08/06/2011 0545   CO2 22 08/06/2011 0545   GLUCOSE 126* 08/06/2011 0545   BUN 33* 08/06/2011 0545   CREATININE 1.11 08/06/2011 0545   CALCIUM 8.6 08/06/2011 0545   PROT 7.8 07/31/2011 1737   ALBUMIN 2.3* 07/31/2011 1737   AST 46* 07/31/2011 1737   ALT 67* 07/31/2011 1737   ALKPHOS 218* 07/31/2011 1737   BILITOT 1.0 07/31/2011 1737   GFRNONAA 62* 08/06/2011 0545   GFRAA 71* 08/06/2011 0545     Intake/Output Summary (Last 24 hours) at 08/07/11  1504 Last data filed at 08/07/11 1033  Gross per 24 hour  Intake    160 ml  Output    450 ml  Net   -290 ml    CBG (last 3)   Basename 08/07/11 1123 08/07/11 0607 08/06/11 2103  GLUCAP 178* 180* 214*    Diet Order: Dysphagia 2, thin liquids  Supplements/Tube Feeding: N/A  IVF:    dextrose 5 % and 0.45% NaCl Last Rate: 10 mL/hr at 08/07/11 1228    Estimated Nutritional Needs:   Kcal: 1800-2000 Protein: 90-100 gm Fluid: 1.8-2.0 L  Patient sleeping soundly upon RD visitation; attempt to wake x 2; patient s/p L AKA due to gangrene of the L foot; s/p bedside swallow evaluation 7/18 -- patient demonstrated mild oral dysphagia; PO intake very poor at 10-25% per flowsheet records; patient at risk for further skin breakdown; would benefit from supplemental addition -- RD to order.  NUTRITION DIAGNOSIS: -Increased nutrient needs (NI-5.1).  Status: Ongoing  RELATED TO: post-op healing  AS EVIDENCE BY: estimated nutrition needs  MONITORING/EVALUATION(Goals): Goal: Oral intake with meals & supplements to meet >/= 90% of estimated  nutrition needs Monitor: PO & supplemental intake, weight, labs, I/O's  EDUCATION NEEDS: -No education needs identified at this time  Dietitian #: [phone]  DOCUMENTATION CODES Per approved criteria  -Not Applicable    Alger Memos 08/07/2011, 3:03 PM

## 2011-08-07 NOTE — Progress Notes (Signed)
ANTIBIOTIC CONSULT NOTE - FOLLOW UP  Pharmacy Consult for vancomycin Indication: gangrenous Left foot now s/p Left BKA 7/17  No Known Allergies   Labs:  Basename 08/07/11 0505 08/06/11 0545 08/05/11 1945  WBC 6.5 7.0 --  HGB 8.5* 8.2* --  PLT 244 241 --  LABCREA -- -- --  CREATININE -- 1.11 1.24   Estimated Creatinine Clearance: 53.1 ml/min (by C-G formula based on Cr of 1.11).  Basename 08/04/11 1539  VANCOTROUGH 23.6*  VANCOPEAK --  Drue Dun --  GENTTROUGH --  GENTPEAK --  GENTRANDOM --  TOBRATROUGH --  TOBRAPEAK --  TOBRARND --  AMIKACINPEAK --  AMIKACINTROU --  AMIKACIN --     Assessment: 45 YOM admitted with gangrenous/necrotic Left foot with osteomyelitis.  He is now s/p L BKA.  He has previous R BKA. Orders for vancomycin for wound and osteomyelitis.  Vancomycin trough 7.19= 23.21mcg/ml on 1gm IV q12h. No new post-op weight.    Day #8 of antibiotics  Goal of Therapy:  Vancomycin trough level 10-15 mcg/ml  Plan:  1. Continue vancomycin to 1250mg  IV q24h, now that infected foot amputated, no need for higher troughs.  2. Follow-up duration of therapy.  Elwin Sleight 08/07/2011,9:35 AM

## 2011-08-08 ENCOUNTER — Encounter: Payer: Self-pay | Admitting: Vascular Surgery

## 2011-08-08 LAB — GLUCOSE, CAPILLARY
Glucose-Capillary: 105 mg/dL — ABNORMAL HIGH (ref 70–99)
Glucose-Capillary: 129 mg/dL — ABNORMAL HIGH (ref 70–99)

## 2011-08-08 LAB — BASIC METABOLIC PANEL
Chloride: 105 mEq/L (ref 96–112)
GFR calc Af Amer: >90 mL/min (ref >90)
GFR calc non Af Amer: 84 mL/min — ABNORMAL LOW (ref >90)
Glucose, Bld: 99 mg/dL (ref 70–99)
Potassium: 3.6 mEq/L (ref 3.5–5.1)
Sodium: 138 mEq/L (ref 135–145)

## 2011-08-08 LAB — CBC
Hemoglobin: 8.3 g/dL — ABNORMAL LOW (ref 13.0–17.0)
MCH: 24.9 pg — ABNORMAL LOW (ref 26.0–34.0)
RBC: 3.34 MIL/uL — ABNORMAL LOW (ref 4.22–5.81)

## 2011-08-08 MED FILL — Insulin Aspart Inj 100 Unit/ML: SUBCUTANEOUS | Qty: 0.02 | Status: AC

## 2011-08-08 MED FILL — Carvedilol Tab 6.25 MG: ORAL | Qty: 1 | Status: AC

## 2011-08-08 NOTE — Progress Notes (Signed)
Subjective:  Patient denies any chest pain or shortness of breath. Appetite remains poor. Family finally has decided regarding [facility] skilled nursing facility Objective:  Vital Signs in the last 24 hours: Temp:  [98.6 F (37 C)-98.8 F (37.1 C)] 98.8 F (37.1 C) (07/23 1348) Pulse Rate:  [60-62] 60  (07/23 1348) Resp:  [18-20] 20  (07/23 1348) BP: (105-119)/(64-86) 115/64 mmHg (07/23 1348) SpO2:  [91 %-100 %] 100 % (07/23 1348)  Intake/Output from previous day: 07/22 0701 - 07/23 0700 In: 40 [P.O.:40] Out: 700 [Urine:700] Intake/Output from this shift: Total I/O In: 360 [P.O.:360] Out: -   Physical Exam: Neck: no adenopathy, no carotid bruit, no JVD and supple, symmetrical, trachea midline Lungs: clear to auscultation bilaterally Heart: regular rate and rhythm, S1, S2 normal and Soft systolic murmur noted Abdomen: soft, non-tender; bowel sounds normal; no masses,  no organomegaly Extremities: Left BKA stump surgical area dry staples noted. Right BKA old.  Lab Results:  Basename 08/08/11 0545 08/07/11 0505  WBC 13.1* 6.5  HGB 8.3* 8.5*  PLT 217 244    Basename 08/08/11 0545 08/06/11 0545  NA 138 133*  K 3.6 5.4*  CL 105 102  CO2 26 22  GLUCOSE 99 126*  BUN 19 33*  CREATININE 0.78 1.11   No results found for this basename: TROPONINI:2,CK,MB:2 in the last 72 hours Hepatic Function Panel No results found for this basename: PROT,ALBUMIN,AST,ALT,ALKPHOS,BILITOT,BILIDIR,IBILI in the last 72 hours No results found for this basename: CHOL in the last 72 hours No results found for this basename: PROTIME in the last 72 hours  Imaging: Imaging results have been reviewed and No results found.  Cardiac Studies:  Assessment/Plan:  Gangrenous left foot with osteomyelitis status post left BKA postop day 5 doing well  Coronary artery disease history of MI in the past  Ischemic cardiomyopathy  Compensated systolic heart failure  History of complete heart block  status post permanent pacemaker  Severe peripheral vascular disease history of right below-knee amputation in the past  Hypertension  Diabetes matters  Hypercholesteremia  History of CVA and PTA to middle cerebral artery in the past  Plan  Continue present management per vascular surgery  Will DC to skilled nursing facility in a.m.  LOS: 8 days    Beryl Balz N 08/08/2011, 5:49 PM

## 2011-08-08 NOTE — Progress Notes (Signed)
Family has selected SNF bed at [facility] for patient at d/c. Advised the SNF that the patient has selected them and will await MD for d/c date- Reece Levy, MSW, [place] [phone]

## 2011-08-09 ENCOUNTER — Ambulatory Visit: Payer: Self-pay | Admitting: Vascular Surgery

## 2011-08-09 LAB — GLUCOSE, CAPILLARY: Glucose-Capillary: 141 mg/dL — ABNORMAL HIGH (ref 70–99)

## 2011-08-09 MED ORDER — ISOSORBIDE MONONITRATE ER 60 MG PO TB24: 60.0000 mg | ORAL_TABLET | Freq: Every day | ORAL | Status: AC

## 2011-08-09 MED ORDER — CARVEDILOL 6.25 MG PO TABS: 6.2500 mg | ORAL_TABLET | Freq: Two times a day (BID) | ORAL | Status: AC

## 2011-08-09 MED ORDER — RAMIPRIL 5 MG PO CAPS: 5.0000 mg | ORAL_CAPSULE | Freq: Every day | ORAL | Status: DC

## 2011-08-09 MED ORDER — PANTOPRAZOLE SODIUM 40 MG PO TBEC: 40.0000 mg | DELAYED_RELEASE_TABLET | Freq: Every day | ORAL | Status: AC

## 2011-08-09 MED ORDER — ASPIRIN 81 MG PO TBEC: 81.0000 mg | DELAYED_RELEASE_TABLET | Freq: Every day | ORAL | Status: AC

## 2011-08-09 MED ORDER — GLUCERNA SHAKE PO LIQD: 237.0000 mL | Freq: Two times a day (BID) | ORAL | Status: DC

## 2011-08-09 NOTE — Progress Notes (Signed)
Patient d/c to skill nursing facility. Report called to the receiving nurse at the facility, discharge teaching with patient and family. All concerns and questions answered.

## 2011-08-09 NOTE — Discharge Summary (Signed)
NAMEGONZALO, [name] NO.:  [id number]  MEDICAL RECORD NO.:  [id number]  LOCATION:  2036                         FACILITY:  [facility]  PHYSICIAN:  Eduardo Osier. Sharyn Lull, M.D. DATE OF BIRTH:  07-Jan-1932  DATE OF ADMISSION:  07/31/2011 DATE OF DISCHARGE:  08/09/2011                              DISCHARGE SUMMARY   ADMITTING DIAGNOSES: 1. Gangrenous right foot with osteomyelitis with foul-smelling     drainage. 2. Coronary artery disease, history of myocardial infarction in the     past. 3. Ischemic cardiomyopathy. 4. Compensated systolic heart failure. 5. History of complete heart block status post permanent pacemaker. 6. Severe peripheral vascular disease, history of right below-knee     amputation in the past. 7. Hypertension. 8. Diabetes mellitus controlled by diet. 9. Hypercholesteremia. 10.History of cerebrovascular accident status post PTA to middle     cerebral artery in the past. 11.Constipation. 12.Hyperkalemia.  DISCHARGE DIAGNOSES: 1. Status post gangrenous left foot with osteomyelitis status post     left below-knee amputation. 2. Coronary artery disease, history of myocardial infarction in the     past. 3. Ischemic cardiomyopathy. 4. Compensated systolic heart failure. 5. History of complete heart block status post permanent pacemaker. 6. Severe peripheral vascular disease, history of right below-knee     amputation in the past. 7. Hypertension. 8. Diabetes mellitus, controlled by diet. 9. History of cerebrovascular accident status post PTA to middle     cerebral artery in the past. 10.Status post hyperkalemia.  DISCHARGE MEDICATIONS: 1. Carvedilol 6.25 mg 1 tablet twice daily. 2. Imdur 60 mg 1 tablet daily. 3. Protonix 40 mg 1 tablet daily. 4. Ramipril 5 mg 1 capsule daily. 5. Enteric-coated aspirin 81 mg 1 tablet daily. 6. Atorvastatin 80 mg 1 tablet daily. 7. Clopidogrel 75 mg 1 tablet daily. 8. Feeding supplement Glucerna 1 can twice  daily.  DIET:  Low salt, low cholesterol, 1800 calories ADA diet.  Monitor blood sugar and blood pressure daily.  Follow up with me in 2 weeks.  Follow up with Vascular Surgery as scheduled.  CONDITION AT DISCHARGE:  Stable.  BRIEF HISTORY AND HOSPITAL COURSE:  [name] is a 78 year old male with past medical history significant for multiple medical problems, i.e., coronary artery disease, history of non-Q-wave myocardial infarction in the past, ischemic cardiomyopathy, history of congestive heart failure secondary to systolic dysfunction, history of complete heart block status post permanent pacemaker in the past, hypertension, non-insulin-dependent diabetes mellitus, severe peripheral vascular disease with gangrene of the left foot status post right below-knee amputation in the past, hypercholesteremia, history of CVA and PTA to middle cerebral artery in the past.  He came to the ER complaining of left foot pain associated with foul-smelling discharge associated with chills.  Denies any fever.  Also complains of vague abdominal pain associated with constipation for the last few days.  Denies any chest pain, nausea, vomiting, or diaphoresis.  Denies PND, orthopnea, or leg swelling.  The patient recently underwent nuclear stress test, which showed global hypokinesia with no evidence of ischemia with ejection fraction of 32%.  PAST MEDICAL HISTORY:  As above.  PAST SURGICAL HISTORY:  He had appendectomy in the past,  had right below- knee amputation in the past, had pacemaker insertion in the past, had cardiac catheterization in the past.  PHYSICAL EXAMINATION:  VITAL SIGNS:  His blood pressure was 152/92, pulse was 94.  He was afebrile. HEENT:  Conjunctivae was pink. NECK:  Supple.  No JVD.  No bruits. LUNGS:  Clear to auscultation without rhonchi or rales. CARDIOVASCULAR:  S1 and S2 was normal.  There was soft systolic murmur. There was no S3 gallop. EXTREMITIES:  There was  no clubbing, cyanosis.  There was 1+ edema in the left leg with gangrenous toes with foul-smelling discharge.  Right leg, he had right below-knee amputation. NEURO:  Grossly intact.  LABORATORY DATA:  Sodium was 136, potassium 5.9, BUN 36, creatinine 1.20.  Repeat electrolytes; sodium 138, potassium 4.7, BUN 32, creatinine 1.03.  His labs yesterday was sodium 138, potassium 3.6, BUN 19, creatinine 0.78.  Hemoglobin is 8.3, hematocrit 25.3, white count of 13.1.  His blood sugar has ranged between 100s to 180s.  BRIEF HOSPITAL COURSE:  The patient was admitted to telemetry bed, was started on IV vancomycin.  Vascular surgical consultation was obtained with Dr. Edilia Bo.  The patient subsequently underwent left below-knee amputation.  The patient tolerated procedure well.  There were no complications.  OT/PT consultation was called.  The patient remains mostly bed-bound, but able to get out of bed with assistance to the chair.  The patient will be transferred to [facility] for rehab and he will be followed up closely by Vascular Surgery next week and in my office in 2 weeks.     Eduardo Osier. Sharyn Lull, M.D.    [facility]  D:  08/09/2011  T:  08/09/2011  Job:  [id number]

## 2011-08-09 NOTE — Progress Notes (Signed)
Occupational Therapy Treatment Patient Details Name: [name] MRN: [id number] DOB: Jul 27, 1931 Today's Date: 08/09/2011 Time: 2956-2130 OT Time Calculation (min): 40 min  OT Assessment / Plan / Recommendation Comments on Treatment Session Pt doing much better with attention and awareness this morning compared to previous session.  Imprved sitting balance EOB while participating in bathing task.  Still with increased bilateral UE weakness impacting bed mobility.  Needs total +2 for all transfers as well.  Continue with recommendation for SNF level rehab.    Follow Up Recommendations  Skilled nursing facility       Equipment Recommendations  Defer to next venue       Frequency Min 2X/week   Plan Discharge plan remains appropriate    Precautions / Restrictions Precautions Precautions: Fall   Pertinent Vitals/Pain Pt with pain at approximately 2/10 overall in the LLE, repositioned LE    ADL  Grooming: Performed;Wash/dry hands;Wash/dry face;Supervision/safety Where Assessed - Grooming: Unsupported sitting Upper Body Bathing: Performed;Set up Where Assessed - Upper Body Bathing: Unsupported sitting Lower Body Bathing: Performed;Maximal assistance Where Assessed - Lower Body Bathing: Lean right and/or left;Supported sitting Upper Body Dressing: Performed;Minimal assistance (For hospital gown only) Where Assessed - Upper Body Dressing: Unsupported sitting Lower Body Dressing: +1 Total assistance (total assist to donn RLE prosthesis.) Where Assessed - Lower Body Dressing: Unsupported sitting Toilet Transfer: Simulated;+2 Total assistance Toilet Transfer: Patient Percentage: 10% Toilet Transfer Method: Squat pivot Acupuncturist: Other (comment) (Pt transferred to the bedside chair, declined need to toilet) Transfers/Ambulation Related to ADLs: Pt requires total assist  +2 (pt 10%) for squat pivot to bedside chair with his prosthesis on the RLE. ADL Comments: Pt much more  alert and aware this session compared to last visit with this therapist.  Able to maintain sitting balance EOB at close supervision during bathing tasks.  Needs mod facilitation to transition from sidelying to sitting after peri washing.  Still with limited bilateral shoulder strength making it slightly difficult to reach the wash pan sitting on the bedside table.       OT Goals ADL Goals ADL Goal: Grooming - Progress: Met ADL Goal: Upper Body Bathing - Progress: Met ADL Goal: Lower Body Bathing - Progress: Progressing toward goals ADL Goal: Upper Body Dressing - Progress: Progressing toward goals ADL Goal: Lower Body Dressing - Progress: Progressing toward goals  Visit Information  Last OT Received On: 08/09/11 Assistance Needed: +1    Subjective Data  Subjective: "I'm tired of being in the bed." Patient Stated Goal: To currently get OOB.      Cognition  Overall Cognitive Status: Appears within functional limits for tasks assessed/performed Arousal/Alertness: Awake/alert Behavior During Session: [facility] for tasks performed    Mobility Bed Mobility Bed Mobility: Rolling Right;Right Sidelying to Sit;Left Sidelying to Sit Rolling Right: 4: Min assist;With rail Right Sidelying to Sit: 3: Mod assist;HOB flat Left Sidelying to Sit: 3: Mod assist;HOB flat   Exercises    Balance Balance Balance Assessed: Yes Static Sitting Balance Static Sitting - Balance Support: Right upper extremity supported;Left upper extremity supported Static Sitting - Level of Assistance: 5: Stand by assistance Dynamic Sitting Balance Dynamic Sitting - Balance Support: No upper extremity supported Dynamic Sitting - Level of Assistance: 5: Stand by assistance;Other (comment) (During bathing tasks on the edge of the bed.)  End of Session OT - End of Session Activity Tolerance: Patient tolerated treatment well Patient left: in chair;with call bell/phone within reach Nurse Communication: Need for lift equipment  [name] OTR/L Pager number [phone] 08/09/2011, 11:04 AM

## 2011-08-09 NOTE — Progress Notes (Signed)
Patient for d/c today to SNF bed at [facility] Patient and family agreeable to this plan- plan transfer via EMS. Reece Levy, MSW, Theresia Majors [phone]

## 2011-08-09 NOTE — Progress Notes (Signed)
Physical Therapy Treatment Patient Details Name: [name] MRN: [id number] DOB: 1931/03/16 Today's Date: 08/09/2011 Time: 7829-5621 PT Time Calculation (min): 18 min  PT Assessment / Plan / Recommendation Comments on Treatment Session  UE weakness continues to hinder mobility.     Follow Up Recommendations  Skilled nursing facility    Barriers to Discharge        Equipment Recommendations  Defer to next venue    Recommendations for Other Services    Frequency Min 3X/week   Plan Discharge plan remains appropriate;Frequency remains appropriate    Precautions / Restrictions Precautions Precautions: Fall Restrictions Weight Bearing Restrictions: Yes   Pertinent Vitals/Pain No c/o pain.     Mobility  Bed Mobility Bed Mobility: Rolling Right;Right Sidelying to Sit;Left Sidelying to Sit Rolling Right: 4: Min assist;With rail Right Sidelying to Sit: 3: Mod assist;HOB flat Left Sidelying to Sit: Not tested (comment) Transfers Transfers: Sit to Stand;Stand to Sit Sit to Stand: 1: +2 Total assist Sit to Stand: Patient Percentage: 30% Stand to Sit: 1: +2 Total assist Stand to Sit: Patient Percentage: 40% Details for Transfer Assistance: Sit to stand with prosthetic LE limited by UE weakness.  Ambulation/Gait Ambulation/Gait Assistance: Not tested (comment)    Exercises     PT Diagnosis:    PT Problem List:   PT Treatment Interventions:     PT Goals Acute Rehab PT Goals PT Goal Formulation: With patient Time For Goal Achievement: 08/17/11 Potential to Achieve Goals: Fair Pt will Roll Supine to Right Side: with min assist PT Goal: Rolling Supine to Right Side - Progress: Progressing toward goal Pt will Roll Supine to Left Side: with min assist Pt will go Supine/Side to Sit: with mod assist PT Goal: Supine/Side to Sit - Progress: Progressing toward goal Pt will Sit at Edge of Bed: with min assist;3-5 min;with bilateral upper extremity support Pt will Transfer Bed to  Chair/Chair to Bed: with mod assist PT Transfer Goal: Bed to Chair/Chair to Bed - Progress: Progressing toward goal  Visit Information  Last PT Received On: 08/09/11    Subjective Data  Subjective: pt presents with very little verbal communication.    Cognition  Overall Cognitive Status: Appears within functional limits for tasks assessed/performed Arousal/Alertness: Awake/alert Behavior During Session: [facility] for tasks performed    Balance  Balance Balance Assessed: No  End of Session PT - End of Session Equipment Utilized During Treatment: Gait belt Activity Tolerance: Patient limited by fatigue;Patient limited by pain Nurse Communication: Mobility status;Need for lift equipment   GP     [name] 08/09/2011, 5:37 PM [name] L. Erron Wengert DPT [phone]

## 2011-08-09 NOTE — Discharge Summary (Signed)
  Prior to discharge summary dictated on 08/09/2011 dictation number is [phone]

## 2011-08-11 ENCOUNTER — Telehealth: Payer: Self-pay | Admitting: Vascular Surgery

## 2011-08-11 NOTE — Telephone Encounter (Signed)
Patients sister called to ask about patients follow up from his surgery with [facility] on 08/02/11.   From what I could tell, we did not receive any staff messages on [name].  Told patients sister [name] that I would have a nurse check on his follow up and we would give her a call to get her scheduled. dpm

## 2011-08-14 ENCOUNTER — Encounter: Payer: Self-pay | Admitting: Vascular Surgery

## 2011-08-15 ENCOUNTER — Ambulatory Visit (INDEPENDENT_AMBULATORY_CARE_PROVIDER_SITE_OTHER): Payer: Medicare Other | Admitting: Vascular Surgery

## 2011-08-15 ENCOUNTER — Encounter: Payer: Self-pay | Admitting: Vascular Surgery

## 2011-08-15 VITALS — BP 123/65 | HR 72 | Temp 98.1°F | Ht 68.0 in | Wt 150.0 lb

## 2011-08-15 DIAGNOSIS — L98499 Non-pressure chronic ulcer of skin of other sites with unspecified severity: Secondary | ICD-10-CM

## 2011-08-15 DIAGNOSIS — I739 Peripheral vascular disease, unspecified: Secondary | ICD-10-CM

## 2011-08-15 NOTE — Progress Notes (Signed)
[facility] [place]  Postoperative Visit - Amputation Date of Surgery: 08/02/11 Left BKA Surgeon: TFE  History of Present Illness  [name] is a 78 y.o. male who presents for postoperative follow-up ZOX:WRUE BKA: edema control with ace wraps or stump shrinker.  The patient's denies drainage or redness at wound. Pt.denies increased pain in the stump. The patient notes pain is well controlled.  Pt has prothesis for right from [employer]  Physical Examination  Filed Vitals:   08/15/11 1341  BP: 123/65  Pulse: 72  Temp: 98.1 F (36.7 C)    Pt is A&Ox3  WDWN male with no complaints  Left amputation wound is clean, dry and no drainage.  There is good  bone coverage in the stump Stump is warm and well perfused, without drainage; without erythema  Staples were removed without difficulty  Assessment/plan:  [name] is a 78 y.o. male who is s/p left BKA: edema control with ace wraps or stump shrinker . Pt will return in 2 weeks to have staples removed  The patient's stump is viable.  The patient has been referred for prosthetic fitting.  The patient can follow up with [place] as needed.  Clinic MD: TFE    I have examined the patient, reviewed and agree with above.  EARLY, TODD, MD 08/15/2011 2:17 PM

## 2011-08-28 ENCOUNTER — Encounter: Payer: Self-pay | Admitting: Neurosurgery

## 2011-08-29 ENCOUNTER — Ambulatory Visit: Payer: Self-pay | Admitting: Neurosurgery

## 2011-08-29 ENCOUNTER — Encounter: Payer: Self-pay | Admitting: Neurosurgery

## 2011-08-30 ENCOUNTER — Encounter: Payer: Self-pay | Admitting: Neurosurgery

## 2011-08-30 ENCOUNTER — Ambulatory Visit (INDEPENDENT_AMBULATORY_CARE_PROVIDER_SITE_OTHER): Payer: Medicare Other | Admitting: Neurosurgery

## 2011-08-30 VITALS — BP 142/80 | HR 74 | Resp 16 | Ht 69.0 in | Wt 150.0 lb

## 2011-08-30 DIAGNOSIS — L98499 Non-pressure chronic ulcer of skin of other sites with unspecified severity: Secondary | ICD-10-CM

## 2011-08-30 NOTE — Progress Notes (Signed)
Subjective:     Patient ID: [name], male   DOB: Dec 31, 1931, 78 y.o.   MRN: [id number]  HPI: 78 year old male patient of Dr. Arbie Cookey who follows up for staple removal status post left BKA 08/02/2011. The patient denies complaints of pain and has done well since his initial postop followup   Review of Systems: 12 point review of systems is notable for the difficulties described above otherwise unremarkable     Objective:   Physical Exam: Afebrile, vital signs are stable, left BKA stump is healed well there is no redness no drainage     Assessment:     The patient one month status post left BKA and doing well, his sister states she has a prescription for a shrinker which she is going to proceed with obtaining and very well may proceed in the future with prosthesis fitting.    Plan:     Staples were removed today without difficulty there is no bleeding no drainage no redness, the patient will followup here on a when necessary basis, he and his sisters questions were encouraged and answered they're in agreement with this plan. Next  Lauree Chandler ANP  Chronic M.D.: Edilia Bo

## 2011-10-02 ENCOUNTER — Encounter: Payer: Self-pay | Admitting: *Deleted

## 2011-10-02 DIAGNOSIS — Z950 Presence of cardiac pacemaker: Secondary | ICD-10-CM | POA: Insufficient documentation

## 2011-10-13 ENCOUNTER — Encounter: Payer: Self-pay | Admitting: Internal Medicine

## 2011-10-27 ENCOUNTER — Encounter: Payer: Self-pay | Admitting: Internal Medicine

## 2012-02-06 ENCOUNTER — Other Ambulatory Visit: Payer: Self-pay | Admitting: *Deleted

## 2012-02-06 DIAGNOSIS — Z0189 Encounter for other specified special examinations: Secondary | ICD-10-CM

## 2012-03-05 ENCOUNTER — Other Ambulatory Visit: Payer: Self-pay | Admitting: *Deleted

## 2012-03-05 DIAGNOSIS — I739 Peripheral vascular disease, unspecified: Secondary | ICD-10-CM

## 2012-03-07 ENCOUNTER — Other Ambulatory Visit: Payer: Self-pay | Admitting: *Deleted

## 2012-03-07 DIAGNOSIS — I739 Peripheral vascular disease, unspecified: Secondary | ICD-10-CM

## 2012-03-18 ENCOUNTER — Ambulatory Visit: Payer: Medicare Other | Admitting: Physical Therapy

## 2012-03-28 ENCOUNTER — Ambulatory Visit: Payer: Medicare Other | Attending: Vascular Surgery | Admitting: Physical Therapy

## 2012-03-28 DIAGNOSIS — R5381 Other malaise: Secondary | ICD-10-CM | POA: Insufficient documentation

## 2012-03-28 DIAGNOSIS — Z4789 Encounter for other orthopedic aftercare: Secondary | ICD-10-CM | POA: Insufficient documentation

## 2012-03-28 DIAGNOSIS — IMO0001 Reserved for inherently not codable concepts without codable children: Secondary | ICD-10-CM | POA: Insufficient documentation

## 2012-03-28 DIAGNOSIS — R269 Unspecified abnormalities of gait and mobility: Secondary | ICD-10-CM | POA: Insufficient documentation

## 2012-03-28 DIAGNOSIS — M6281 Muscle weakness (generalized): Secondary | ICD-10-CM | POA: Insufficient documentation

## 2012-04-01 ENCOUNTER — Ambulatory Visit: Payer: Medicare Other | Admitting: Physical Therapy

## 2012-04-03 ENCOUNTER — Encounter: Payer: Self-pay | Admitting: *Deleted

## 2012-04-04 ENCOUNTER — Ambulatory Visit: Payer: Medicare Other | Admitting: Physical Therapy

## 2012-04-09 ENCOUNTER — Ambulatory Visit: Payer: Medicare Other | Admitting: Physical Therapy

## 2012-04-11 ENCOUNTER — Encounter: Payer: Self-pay | Admitting: Physical Therapy

## 2012-04-11 ENCOUNTER — Telehealth: Payer: Self-pay | Admitting: Cardiology

## 2012-04-11 NOTE — Telephone Encounter (Signed)
Daughter called regarding her father. Had pacemaker placed ~2011. Over the last several weeks has had intermittent pain at the site of his pacemaker. Sharp and shooting.  No fever, chills, redness, erosion, swelling or other problems at site of pacemaker. Patient and daughter do not feel that the problem is an emergency. Recommended appointment be made in the morning. Red flags discussed for reasons to come to emergency room and they verbalized understanding.

## 2012-04-14 ENCOUNTER — Emergency Department (HOSPITAL_COMMUNITY): Payer: Medicare Other

## 2012-04-14 ENCOUNTER — Emergency Department (HOSPITAL_COMMUNITY)
Admission: EM | Admit: 2012-04-14 | Discharge: 2012-04-14 | Disposition: A | Discharge status: Home / Self Care | Payer: Medicare Other | Attending: Emergency Medicine | Admitting: Emergency Medicine

## 2012-04-14 ENCOUNTER — Encounter (HOSPITAL_COMMUNITY): Payer: Self-pay

## 2012-04-14 DIAGNOSIS — Z8709 Personal history of other diseases of the respiratory system: Secondary | ICD-10-CM | POA: Insufficient documentation

## 2012-04-14 DIAGNOSIS — I2510 Atherosclerotic heart disease of native coronary artery without angina pectoris: Secondary | ICD-10-CM | POA: Insufficient documentation

## 2012-04-14 DIAGNOSIS — R42 Dizziness and giddiness: Secondary | ICD-10-CM | POA: Insufficient documentation

## 2012-04-14 DIAGNOSIS — I10 Essential (primary) hypertension: Secondary | ICD-10-CM | POA: Insufficient documentation

## 2012-04-14 DIAGNOSIS — Z9861 Coronary angioplasty status: Secondary | ICD-10-CM | POA: Insufficient documentation

## 2012-04-14 DIAGNOSIS — Z8679 Personal history of other diseases of the circulatory system: Secondary | ICD-10-CM | POA: Insufficient documentation

## 2012-04-14 DIAGNOSIS — Z950 Presence of cardiac pacemaker: Secondary | ICD-10-CM | POA: Insufficient documentation

## 2012-04-14 DIAGNOSIS — Z79899 Other long term (current) drug therapy: Secondary | ICD-10-CM | POA: Insufficient documentation

## 2012-04-14 DIAGNOSIS — Z7902 Long term (current) use of antithrombotics/antiplatelets: Secondary | ICD-10-CM | POA: Insufficient documentation

## 2012-04-14 DIAGNOSIS — Z8673 Personal history of transient ischemic attack (TIA), and cerebral infarction without residual deficits: Secondary | ICD-10-CM | POA: Insufficient documentation

## 2012-04-14 DIAGNOSIS — Z7982 Long term (current) use of aspirin: Secondary | ICD-10-CM | POA: Insufficient documentation

## 2012-04-14 DIAGNOSIS — H409 Unspecified glaucoma: Secondary | ICD-10-CM | POA: Insufficient documentation

## 2012-04-14 DIAGNOSIS — R51 Headache: Secondary | ICD-10-CM | POA: Insufficient documentation

## 2012-04-14 DIAGNOSIS — IMO0001 Reserved for inherently not codable concepts without codable children: Secondary | ICD-10-CM | POA: Insufficient documentation

## 2012-04-14 DIAGNOSIS — E7800 Pure hypercholesterolemia, unspecified: Secondary | ICD-10-CM | POA: Insufficient documentation

## 2012-04-14 LAB — URINALYSIS, ROUTINE W REFLEX MICROSCOPIC
Bilirubin Urine: NEGATIVE
Glucose, UA: 100 mg/dL — AB
Hgb urine dipstick: NEGATIVE
Ketones, ur: NEGATIVE mg/dL
Leukocytes, UA: NEGATIVE
Protein, ur: 30 mg/dL — AB
pH: 6 (ref 5.0–8.0)

## 2012-04-14 LAB — CBC WITH DIFFERENTIAL/PLATELET
Eosinophils Relative: 4 % (ref 0–5)
HCT: 32.7 % — ABNORMAL LOW (ref 39.0–52.0)
Hemoglobin: 10.8 g/dL — ABNORMAL LOW (ref 13.0–17.0)
Lymphocytes Relative: 24 % (ref 12–46)
MCV: 84.1 fL (ref 78.0–100.0)
Monocytes Absolute: 0.4 10*3/uL (ref 0.1–1.0)
Monocytes Relative: 7 % (ref 3–12)
Neutro Abs: 4 10*3/uL (ref 1.7–7.7)
RDW: 15.6 % — ABNORMAL HIGH (ref 11.5–15.5)
WBC: 6.2 10*3/uL (ref 4.0–10.5)

## 2012-04-14 LAB — BASIC METABOLIC PANEL
BUN: 20 mg/dL (ref 6–23)
CO2: 27 mEq/L (ref 19–32)
Calcium: 10.3 mg/dL (ref 8.4–10.5)
Chloride: 105 mEq/L (ref 96–112)
Creatinine, Ser: 0.96 mg/dL (ref 0.50–1.35)
Glucose, Bld: 113 mg/dL — ABNORMAL HIGH (ref 70–99)

## 2012-04-14 LAB — URINE MICROSCOPIC-ADD ON: Urine-Other: NONE SEEN

## 2012-04-14 LAB — GLUCOSE, CAPILLARY

## 2012-04-14 NOTE — ED Provider Notes (Signed)
History     CSN: [id number]  Arrival date & time 04/14/12  1455   First MD Initiated Contact with Patient 04/14/12 1721      Chief Complaint  Patient presents with  . Headache    (Consider location/radiation/quality/duration/timing/severity/associated sxs/prior treatment) HPI Comments: Is a 79 year old male with an extensive past medical history who presents for gradual onset of headache this morning. Patient states his headache came on after eating breakfast, present primarily in his forehead extending down the left side of his face into his left temporal region. Patient denies any aggravating or alleviating factors of this headache, stating that it resolved approximately around 2 PM. Patient denies experiencing a headache like this in the past and admits to associated dizziness with the headache. He denies fever, dysarthria or aphasia, neck pain or stiffness, vision changes, chest pain, shortness of breath, nausea or vomiting, abdominal pain or diarrhea, and urinary symptoms. Patient has a history of CVA "in the last few years" as well as extensive coronary artery disease, peripheral vascular disease, and uncontrolled diabetes. Patient denies polyuria or polydipsia and states his blood sugar per EMS was around 250. Patient is asymptomatic, hemodynamically stable, and well and nontoxic appearing at this time. He speaks in full sentences without dysarthria or aphasia, he is alert and oriented to person, place, and time, answers questions appropriately, and follow simple commands.  Patient is a 79 y.o. male presenting with headaches. The history is provided by the patient. No language interpreter was used.  Headache Associated symptoms: dizziness   Associated symptoms: no abdominal pain, no diarrhea, no fever, no nausea, no neck stiffness and no vomiting     Past Medical History  Diagnosis Date  . Hypercholesteremia     takes Lipitor daily  . CAD (coronary artery disease)     s/p PTCA to  PLV branch in 2002  . Non-insulin dependent diabetes mellitus   . PVD (peripheral vascular disease)     s/p right BKA  . CVA (cerebral infarction)     s/p PTA to middle cerebral artery;slurred speech  . HTN (hypertension)     takes Benazepril daily  . Pacemaker   . Shortness of breath     sitting as well as standing  . Glaucoma   . Complete heart block     s/p Medtronic PPM by JA 09/2009  . Stroke     Past Surgical History  Procedure Laterality Date  . Pacemaker insertion  09/2010  . Appendectomy    . Amputation      below amputation  . Cardiac catheterization      2002/2009  . Amputation  08/02/2011    Procedure: AMPUTATION BELOW KNEE;  Surgeon: Larina Earthly, MD;  Location: [facility] OR;  Service: Vascular;  Laterality: Left;    Family History  Problem Relation Age of Onset  . Heart failure Mother   . Heart failure Father   . Prostate cancer Brother     History  Substance Use Topics  . Smoking status: Never Smoker   . Smokeless tobacco: Never Used  . Alcohol Use: No     Review of Systems  Constitutional: Negative for fever and chills.  HENT: Negative for trouble swallowing, neck stiffness, tinnitus and ear discharge.   Eyes: Negative for visual disturbance.  Respiratory: Negative for chest tightness and shortness of breath.   Cardiovascular: Negative for chest pain.  Gastrointestinal: Negative for nausea, vomiting, abdominal pain and diarrhea.  Endocrine: Negative for polydipsia and polyuria.  Genitourinary:  Negative for dysuria.  Skin: Negative for color change and pallor.  Neurological: Positive for dizziness, light-headedness and headaches. Negative for syncope.  Psychiatric/Behavioral: Negative for confusion.  All other systems reviewed and are negative.    Allergies  Review of patient's allergies indicates no known allergies.  Home Medications   Current Outpatient Rx  Name  Route  Sig  Dispense  Refill  . aspirin 81 MG EC tablet   Oral   Take 1 tablet  (81 mg total) by mouth daily. Swallow whole.   30 tablet   12   . atorvastatin (LIPITOR) 80 MG tablet   Oral   Take 80 mg by mouth daily.         . carvedilol (COREG) 6.25 MG tablet   Oral   Take 1 tablet (6.25 mg total) by mouth 2 (two) times daily with a meal.   60 tablet   3   . clopidogrel (PLAVIX) 75 MG tablet   Oral   Take 75 mg by mouth daily.         . furosemide (LASIX) 40 MG tablet   Oral   Take 40 mg by mouth daily.         . isosorbide mononitrate (IMDUR) 60 MG 24 hr tablet   Oral   Take 1 tablet (60 mg total) by mouth daily.   30 tablet   3   . pantoprazole (PROTONIX) 40 MG tablet   Oral   Take 1 tablet (40 mg total) by mouth daily at 6 (six) AM.   30 tablet   3   . ramipril (ALTACE) 5 MG capsule   Oral   Take 1 capsule (5 mg total) by mouth daily.   30 capsule   3     BP 168/92  Pulse 79  Temp(Src) 97 F (36.1 C) (Oral)  Resp 18  SpO2 99%  Physical Exam  Nursing note and vitals reviewed. Constitutional: He is oriented to person, place, and time. He appears well-developed and well-nourished. No distress.  Patient lying comfortably in the bed, speaking in full sentences, and following simple commands. In no acute distress.  HENT:  Head: Normocephalic and atraumatic.  Right Ear: External ear normal.  Left Ear: External ear normal.  Mouth/Throat: Oropharynx is clear and moist. No oropharyngeal exudate.  Symmetric rise of the uvula with phonation  Eyes: Conjunctivae and EOM are normal. Pupils are equal, round, and reactive to light. Right eye exhibits no discharge. Left eye exhibits no discharge. No scleral icterus.  Neck: Normal range of motion. Neck supple.  Cardiovascular: Normal rate, regular rhythm, normal heart sounds and intact distal pulses.   Distal radial and popliteal pulses 2+ bilaterally  Pulmonary/Chest: Effort normal and breath sounds normal. No respiratory distress. He has no wheezes. He has no rales.  Abdominal: Soft.  Bowel sounds are normal. He exhibits no distension. There is no tenderness. There is no rebound and no guarding.  Musculoskeletal: Normal range of motion. He exhibits no edema.  Lymphadenopathy:    He has no cervical adenopathy.  Neurological: He is alert and oriented to person, place, and time. No cranial nerve deficit.  Cranial nerves II through XII grossly intact. Patient has equal grip strength bilaterally without appreciation of any sensory or motor deficits; is able to pick up a pen and sign his name. Patient is able to distinguish between sharp and dull touch throughout.  Skin: Skin is warm and dry. No rash noted. He is not diaphoretic. No erythema.  Psychiatric: He has a normal mood and affect. His behavior is normal.    ED Course  Procedures (including critical care time)  Labs Reviewed  CBC WITH DIFFERENTIAL - Abnormal; Notable for the following:    RBC 3.89 (*)    Hemoglobin 10.8 (*)    HCT 32.7 (*)    RDW 15.6 (*)    All other components within normal limits  BASIC METABOLIC PANEL - Abnormal; Notable for the following:    Glucose, Bld 113 (*)    GFR calc non Af Amer 77 (*)    GFR calc Af Amer 89 (*)    All other components within normal limits  URINALYSIS, ROUTINE W REFLEX MICROSCOPIC - Abnormal; Notable for the following:    Glucose, UA 100 (*)    Protein, ur 30 (*)    All other components within normal limits  GLUCOSE, CAPILLARY - Abnormal; Notable for the following:    Glucose-Capillary 110 (*)    All other components within normal limits  URINE MICROSCOPIC-ADD ON   Ct Head Wo Contrast  04/14/2012  *RADIOLOGY REPORT*  Clinical Data: Headache  CT HEAD WITHOUT CONTRAST  Technique:  Contiguous axial images were obtained from the base of the skull through the vertex without contrast.  Comparison: 08/21/2007  Findings: Prominent cerebellar atrophy.  Mild global atrophy. Chronic ischemic changes in the periventricular white matter and brainstem.  No mass effect, midline  shift, or acute intracranial hemorrhage.  IMPRESSION: No acute intracranial pathology.   Original Report Authenticated By: Jolaine Click, M.D.      1. Headache      MDM  Patient is a 79 y/o male who presents for headache with gradual onset this morning resolving prior to being seen in ED. Given PMH and age work up included CBC, BMP, CBG, and UA along with a CT head without contrast. On exam patient neurovascularly intact without sensory or motor deficits. CT head was unremarkable for acute findings and labs, overall, improved from prior work ups. Patient remained asymptomatic, well and nontoxic appearing and in NAD for duration of ED course. Patient stable for d/c with PCP follow up regarding his symptoms and further evaluation of his diabetes; though his diabetes appears well controlled during visit he is currently on no medication. Indications for ED return discussed. Patient and family state comfort and understanding with this d/c plan with no unaddressed concerns. Patient work up and management discussed with Dr. Ignacia Palma who is in agreement.  Filed Vitals:   04/14/12 1456 04/14/12 1503 04/14/12 1731 04/14/12 2001  BP:  189/98 184/94 168/92  Pulse:  90 73 79  Temp:  97 F (36.1 C)    TempSrc:  Oral    Resp:  15 18 18   SpO2: 99% 94% 100% 99%         Antony Madura, PA-C 04/16/12 1840

## 2012-04-14 NOTE — ED Notes (Addendum)
Per ems: pt called today for headache and dizziness about an hour ago. Stated he "just didn't feel right" to ems. Pt from home, A&Ox4, appeared anxious en route. States symptoms have resolved now. Negative stroke screen.

## 2012-04-14 NOTE — ED Notes (Signed)
Pt comfortable with d/c and f/u instructions. 

## 2012-04-14 NOTE — ED Notes (Signed)
Pt knows that urine is needed 

## 2012-04-14 NOTE — Telephone Encounter (Signed)
[name],  Please call the patient and see how he is doing.  He is overdue for follow-up in our clinic it appears.  Please schedule follow-up with [name] for his annual device check.

## 2012-04-14 NOTE — ED Notes (Signed)
Pt reported a headache that started around 1030 this morning.  Headache resolved at his home on its own, but pt's family brought him here for evaluation.

## 2012-04-15 NOTE — Telephone Encounter (Signed)
LMOM for pt and daughter Rodney Booze [place]/kwm

## 2012-04-16 ENCOUNTER — Ambulatory Visit: Payer: Medicare Other | Attending: Vascular Surgery | Admitting: Physical Therapy

## 2012-04-16 DIAGNOSIS — M6281 Muscle weakness (generalized): Secondary | ICD-10-CM | POA: Insufficient documentation

## 2012-04-16 DIAGNOSIS — R5381 Other malaise: Secondary | ICD-10-CM | POA: Insufficient documentation

## 2012-04-16 DIAGNOSIS — IMO0001 Reserved for inherently not codable concepts without codable children: Secondary | ICD-10-CM | POA: Insufficient documentation

## 2012-04-16 DIAGNOSIS — R269 Unspecified abnormalities of gait and mobility: Secondary | ICD-10-CM | POA: Insufficient documentation

## 2012-04-16 NOTE — Telephone Encounter (Signed)
Spoke w/daughter in regards to scheduling pt for office visit with [name]. Pt scheduled for 04-19-12 @ 1400 with Brooke.   Per daughter still having some pain over pacer site. Pt is taking tylenol for pain.

## 2012-04-16 NOTE — Telephone Encounter (Signed)
LMOVM for pt to return call to make appointment.

## 2012-04-17 NOTE — ED Provider Notes (Signed)
Medical screening examination/treatment/procedure(s) were conducted as a shared visit with non-physician practitioner(s) and myself.  I personally evaluated the patient during the encounter 79 yo man with headache this AM.  He has diabetes and peripheral vascular disease, S/P BKA, prior cardiac stent, prior CVA.  Exam shows him awake and alert, asymptomatic.  I reviewed his lab work with him.  Released home.  Carleene Cooper III, MD 04/17/12 2119

## 2012-04-18 ENCOUNTER — Ambulatory Visit: Payer: Medicare Other | Admitting: Physical Therapy

## 2012-04-19 ENCOUNTER — Encounter: Payer: Self-pay | Admitting: Internal Medicine

## 2012-04-19 ENCOUNTER — Ambulatory Visit (INDEPENDENT_AMBULATORY_CARE_PROVIDER_SITE_OTHER): Payer: Medicare Other | Admitting: Cardiology

## 2012-04-19 ENCOUNTER — Encounter: Payer: Self-pay | Admitting: Cardiology

## 2012-04-19 VITALS — BP 128/64 | HR 78

## 2012-04-19 DIAGNOSIS — Z950 Presence of cardiac pacemaker: Secondary | ICD-10-CM

## 2012-04-19 DIAGNOSIS — I442 Atrioventricular block, complete: Secondary | ICD-10-CM

## 2012-04-19 LAB — PACEMAKER DEVICE OBSERVATION
AL AMPLITUDE: 1.4 mv
AL THRESHOLD: 0.75 V
BATTERY VOLTAGE: 2.78 V
RV LEAD THRESHOLD: 0.5 V

## 2012-04-19 NOTE — Patient Instructions (Addendum)
Your physician recommends that you schedule a follow-up appointment in: 6 months with Device Clinic  Your physician wants you to follow-up in: 1 yr with Dr Johney Frame Bonita Quin will receive a reminder letter in the mail two months in advance. If you don't receive a letter, please call our office to schedule the follow-up appointment.

## 2012-04-19 NOTE — Progress Notes (Signed)
ELECTROPHYSIOLOGY OFFICE NOTE  Patient ID: [name] MRN: [id number], DOB/AGE: December 20, 1931   Date of Visit: 04/22/2012  Primary Physician: Robynn Pane, MD Primary Cardiologist: Sharyn Lull, MD Primary EP: Johney Frame, MD Reason for Visit: EP/device follow-up  History of Present Illness  [name] is a 79 year old man with CHB s/p PPM implant who presents today for routine electrophysiology followup. Since last being seen in our clinic, he reports he is doing well. He has no complaints. Today, he specifically denies chest pain or shortness of breath. He denies palpitations, dizziness, near syncope or syncope. He denies LE swelling, orthopnea, PND or recent weight gain. Mr. Dozal reports he is compliant and tolerating medications without difficulty.  Past Medical History Past Medical History  Diagnosis Date  . Hypercholesteremia     takes Lipitor daily  . CAD (coronary artery disease)     s/p PTCA to PLV branch in 2002  . Non-insulin dependent diabetes mellitus   . PVD (peripheral vascular disease)     s/p right BKA  . CVA (cerebral infarction)     s/p PTA to middle cerebral artery;slurred speech  . HTN (hypertension)     takes Benazepril daily  . Pacemaker   . Shortness of breath     sitting as well as standing  . Glaucoma   . Complete heart block     s/p Medtronic PPM by JA 09/2009  . Stroke     Past Surgical History Past Surgical History  Procedure Laterality Date  . Pacemaker insertion  09/2010  . Appendectomy    . Amputation      below amputation  . Cardiac catheterization      2002/2009  . Amputation  08/02/2011    Procedure: AMPUTATION BELOW KNEE;  Surgeon: Larina Earthly, MD;  Location: [facility] OR;  Service: Vascular;  Laterality: Left;     Allergies/Intolerances No Known Allergies  Current Home Medications Current Outpatient Prescriptions  Medication Sig Dispense Refill  . aspirin 81 MG EC tablet Take 1 tablet (81 mg total) by mouth daily. Swallow whole.  30  tablet  12  . atorvastatin (LIPITOR) 80 MG tablet Take 80 mg by mouth daily.      . carvedilol (COREG) 6.25 MG tablet Take 1 tablet (6.25 mg total) by mouth 2 (two) times daily with a meal.  60 tablet  3  . clopidogrel (PLAVIX) 75 MG tablet Take 75 mg by mouth daily.      . furosemide (LASIX) 40 MG tablet Take 40 mg by mouth daily.      . isosorbide mononitrate (IMDUR) 60 MG 24 hr tablet Take 1 tablet (60 mg total) by mouth daily.  30 tablet  3  . pantoprazole (PROTONIX) 40 MG tablet Take 1 tablet (40 mg total) by mouth daily at 6 (six) AM.  30 tablet  3  . ramipril (ALTACE) 5 MG capsule Take 1 capsule (5 mg total) by mouth daily.  30 capsule  3   No current facility-administered medications for this visit.    Social History Social History  . Marital Status: Married   Social History Main Topics  . Smoking status: Never Smoker   . Smokeless tobacco: Never Used  . Alcohol Use: No  . Drug Use: No   Review of Systems General: No chills, fever, night sweats or weight changes Cardiovascular: No chest pain, dyspnea on exertion, edema, orthopnea, palpitations, paroxysmal nocturnal dyspnea Dermatological: No rash, lesions or masses Respiratory: No cough, dyspnea Urologic: No hematuria, dysuria  Abdominal: No nausea, vomiting, diarrhea, bright red blood per rectum, melena, or hematemesis Neurologic: No visual changes, weakness, changes in mental status All other systems reviewed and are otherwise negative except as noted above.  Physical Exam Blood pressure 128/64, pulse 78, SpO2 98.00%.  General: Well developed, well appearing 79 year old male in no acute distress. HEENT: Normocephalic, atraumatic. EOMs intact. Sclera nonicteric. Oropharynx clear.  Neck: Supple. No JVD. Lungs: Respirations regular and unlabored, CTA bilaterally. No wheezes, rales or rhonchi. Heart: RRR. S1, S2 present. No murmurs, rub, S3 or S4. Abdomen: Soft, non-distended.  Extremities: No clubbing, cyanosis or edema.  Bilateral BKA noted, wearing prostheses. Psych: Normal affect. Neuro: Alert and oriented X 3. Moves all extremities spontaneously.   Diagnostics Device interrogation today - Normal device function. Thresholds, sensing, impedances consistent with previous measurements. Device programmed to maximize longevity. 13 mode switches, <0.1% of the time, 0 AHR episodes. 0 VHR episodes. Device programmed at appropriate safety margins. Histogram distribution appropriate for patient activity level. Device programmed to optimize intrinsic conduction. Estimated longevity 10.5 years.   Assessment and Plan 1. CHB s/p PPM implant Normal device function No programming changes made Continue routine device follow-up in the device clinic every 6 months Return to clinic for follow-up with Dr. Johney Frame in one year  Signed, Rick Duff, PA-C 04/22/2012, 10:18 AM

## 2012-04-23 ENCOUNTER — Ambulatory Visit: Payer: Medicare Other | Admitting: Physical Therapy

## 2012-04-25 ENCOUNTER — Ambulatory Visit: Payer: Medicare Other | Admitting: Physical Therapy

## 2012-04-30 ENCOUNTER — Ambulatory Visit: Payer: Medicare Other | Admitting: Physical Therapy

## 2012-05-02 ENCOUNTER — Ambulatory Visit: Payer: Medicare Other | Admitting: Physical Therapy

## 2012-05-07 ENCOUNTER — Ambulatory Visit: Payer: Medicare Other | Admitting: Physical Therapy

## 2012-05-09 ENCOUNTER — Ambulatory Visit: Payer: Medicare Other | Admitting: Physical Therapy

## 2012-05-14 ENCOUNTER — Ambulatory Visit: Payer: Medicare Other | Admitting: Physical Therapy

## 2012-05-16 ENCOUNTER — Ambulatory Visit: Payer: Medicare Other | Attending: Vascular Surgery | Admitting: Physical Therapy

## 2012-05-16 DIAGNOSIS — M6281 Muscle weakness (generalized): Secondary | ICD-10-CM | POA: Insufficient documentation

## 2012-05-16 DIAGNOSIS — IMO0001 Reserved for inherently not codable concepts without codable children: Secondary | ICD-10-CM | POA: Insufficient documentation

## 2012-05-16 DIAGNOSIS — R269 Unspecified abnormalities of gait and mobility: Secondary | ICD-10-CM | POA: Insufficient documentation

## 2012-05-16 DIAGNOSIS — R5381 Other malaise: Secondary | ICD-10-CM | POA: Insufficient documentation

## 2012-05-21 ENCOUNTER — Ambulatory Visit: Payer: Medicare Other | Admitting: Physical Therapy

## 2012-05-23 ENCOUNTER — Ambulatory Visit: Payer: Medicare Other | Admitting: Physical Therapy

## 2012-05-28 ENCOUNTER — Ambulatory Visit: Payer: Medicare Other | Admitting: Physical Therapy

## 2012-05-30 ENCOUNTER — Ambulatory Visit: Payer: Medicare Other | Admitting: Physical Therapy

## 2012-06-04 ENCOUNTER — Ambulatory Visit: Payer: Medicare Other | Admitting: Physical Therapy

## 2012-06-06 ENCOUNTER — Ambulatory Visit: Payer: Medicare Other | Admitting: Physical Therapy

## 2012-06-11 ENCOUNTER — Ambulatory Visit: Payer: Medicare Other | Admitting: Physical Therapy

## 2012-06-13 ENCOUNTER — Ambulatory Visit: Payer: Medicare Other | Admitting: Physical Therapy

## 2012-06-18 ENCOUNTER — Ambulatory Visit: Payer: Medicare Other | Admitting: Physical Therapy

## 2012-06-18 ENCOUNTER — Ambulatory Visit: Payer: Medicare Other | Attending: Vascular Surgery | Admitting: Physical Therapy

## 2012-06-18 DIAGNOSIS — R269 Unspecified abnormalities of gait and mobility: Secondary | ICD-10-CM | POA: Insufficient documentation

## 2012-06-18 DIAGNOSIS — R5381 Other malaise: Secondary | ICD-10-CM | POA: Insufficient documentation

## 2012-06-18 DIAGNOSIS — M6281 Muscle weakness (generalized): Secondary | ICD-10-CM | POA: Insufficient documentation

## 2012-06-18 DIAGNOSIS — IMO0001 Reserved for inherently not codable concepts without codable children: Secondary | ICD-10-CM | POA: Insufficient documentation

## 2012-06-20 ENCOUNTER — Ambulatory Visit: Payer: Medicare Other | Admitting: Physical Therapy

## 2012-06-24 ENCOUNTER — Ambulatory Visit: Payer: Medicare Other | Admitting: Physical Therapy

## 2012-06-25 ENCOUNTER — Ambulatory Visit: Payer: Medicare Other | Admitting: Physical Therapy

## 2012-06-26 ENCOUNTER — Ambulatory Visit: Payer: Medicare Other | Admitting: Physical Therapy

## 2012-06-27 ENCOUNTER — Ambulatory Visit: Payer: Medicare Other | Admitting: Physical Therapy

## 2012-07-01 ENCOUNTER — Ambulatory Visit: Payer: Medicare Other | Admitting: Physical Therapy

## 2012-07-02 ENCOUNTER — Ambulatory Visit: Payer: Medicare Other | Admitting: Rehabilitative and Restorative Service Providers"

## 2012-07-03 ENCOUNTER — Ambulatory Visit: Payer: Medicare Other | Admitting: Physical Therapy

## 2012-07-08 ENCOUNTER — Ambulatory Visit: Payer: Medicare Other | Admitting: Physical Therapy

## 2012-07-10 ENCOUNTER — Ambulatory Visit: Payer: Medicare Other | Admitting: Physical Therapy

## 2012-10-24 ENCOUNTER — Ambulatory Visit (INDEPENDENT_AMBULATORY_CARE_PROVIDER_SITE_OTHER): Payer: Medicare Other | Admitting: *Deleted

## 2012-10-24 DIAGNOSIS — I442 Atrioventricular block, complete: Secondary | ICD-10-CM

## 2012-10-24 LAB — PACEMAKER DEVICE OBSERVATION
BAMS-0001: 150 {beats}/min
RV LEAD THRESHOLD: 0.5 V

## 2012-10-24 NOTE — Progress Notes (Signed)
Pacemaker check in clinic. Normal device function. Thresholds, sensing, impedances consistent with previous measurements. Device programmed to maximize longevity. 39 mode switches---longest was 13 hours 34 minutes. ? Anticoagulation. No high ventricular rates noted. Device programmed at appropriate safety margins. Histogram distribution appropriate for patient activity level. Device programmed to optimize intrinsic conduction. Estimated longevity 10 years. LMOM for pt's daughter to call in regards to meds/enrolling in [facility].

## 2012-11-28 ENCOUNTER — Encounter: Payer: Self-pay | Admitting: Internal Medicine

## 2013-02-26 ENCOUNTER — Encounter: Payer: Self-pay | Admitting: Internal Medicine

## 2013-04-03 ENCOUNTER — Encounter (HOSPITAL_COMMUNITY): Payer: Self-pay | Admitting: Emergency Medicine

## 2013-04-03 ENCOUNTER — Inpatient Hospital Stay (HOSPITAL_COMMUNITY)
Admission: EM | Admit: 2013-04-03 | Discharge: 2013-04-15 | DRG: 035 | Disposition: A | Discharge status: Skilled Nursing Facility | Payer: Medicare Other | Attending: Cardiology | Admitting: Cardiology

## 2013-04-03 ENCOUNTER — Emergency Department (HOSPITAL_COMMUNITY): Payer: Medicare Other

## 2013-04-03 ENCOUNTER — Inpatient Hospital Stay (HOSPITAL_COMMUNITY): Payer: Medicare Other

## 2013-04-03 DIAGNOSIS — I2589 Other forms of chronic ischemic heart disease: Secondary | ICD-10-CM | POA: Diagnosis present

## 2013-04-03 DIAGNOSIS — Z8249 Family history of ischemic heart disease and other diseases of the circulatory system: Secondary | ICD-10-CM

## 2013-04-03 DIAGNOSIS — I672 Cerebral atherosclerosis: Secondary | ICD-10-CM | POA: Diagnosis present

## 2013-04-03 DIAGNOSIS — I739 Peripheral vascular disease, unspecified: Secondary | ICD-10-CM | POA: Diagnosis present

## 2013-04-03 DIAGNOSIS — E119 Type 2 diabetes mellitus without complications: Secondary | ICD-10-CM | POA: Diagnosis present

## 2013-04-03 DIAGNOSIS — S88119A Complete traumatic amputation at level between knee and ankle, unspecified lower leg, initial encounter: Secondary | ICD-10-CM

## 2013-04-03 DIAGNOSIS — I6522 Occlusion and stenosis of left carotid artery: Secondary | ICD-10-CM

## 2013-04-03 DIAGNOSIS — R4181 Age-related cognitive decline: Secondary | ICD-10-CM | POA: Diagnosis present

## 2013-04-03 DIAGNOSIS — I10 Essential (primary) hypertension: Secondary | ICD-10-CM | POA: Diagnosis present

## 2013-04-03 DIAGNOSIS — I5022 Chronic systolic (congestive) heart failure: Secondary | ICD-10-CM | POA: Diagnosis present

## 2013-04-03 DIAGNOSIS — Z8673 Personal history of transient ischemic attack (TIA), and cerebral infarction without residual deficits: Secondary | ICD-10-CM

## 2013-04-03 DIAGNOSIS — R471 Dysarthria and anarthria: Secondary | ICD-10-CM | POA: Diagnosis present

## 2013-04-03 DIAGNOSIS — Z7902 Long term (current) use of antithrombotics/antiplatelets: Secondary | ICD-10-CM

## 2013-04-03 DIAGNOSIS — R29810 Facial weakness: Secondary | ICD-10-CM | POA: Diagnosis present

## 2013-04-03 DIAGNOSIS — G8190 Hemiplegia, unspecified affecting unspecified side: Secondary | ICD-10-CM | POA: Diagnosis present

## 2013-04-03 DIAGNOSIS — Z79899 Other long term (current) drug therapy: Secondary | ICD-10-CM

## 2013-04-03 DIAGNOSIS — Z950 Presence of cardiac pacemaker: Secondary | ICD-10-CM

## 2013-04-03 DIAGNOSIS — R531 Weakness: Secondary | ICD-10-CM

## 2013-04-03 DIAGNOSIS — D649 Anemia, unspecified: Secondary | ICD-10-CM | POA: Diagnosis present

## 2013-04-03 DIAGNOSIS — S78119A Complete traumatic amputation at level between unspecified hip and knee, initial encounter: Secondary | ICD-10-CM

## 2013-04-03 DIAGNOSIS — M6281 Muscle weakness (generalized): Secondary | ICD-10-CM

## 2013-04-03 DIAGNOSIS — D62 Acute posthemorrhagic anemia: Secondary | ICD-10-CM | POA: Diagnosis not present

## 2013-04-03 DIAGNOSIS — R4789 Other speech disturbances: Secondary | ICD-10-CM | POA: Diagnosis present

## 2013-04-03 DIAGNOSIS — I639 Cerebral infarction, unspecified: Secondary | ICD-10-CM

## 2013-04-03 DIAGNOSIS — I2510 Atherosclerotic heart disease of native coronary artery without angina pectoris: Secondary | ICD-10-CM | POA: Diagnosis present

## 2013-04-03 DIAGNOSIS — Z9861 Coronary angioplasty status: Secondary | ICD-10-CM

## 2013-04-03 DIAGNOSIS — Z7982 Long term (current) use of aspirin: Secondary | ICD-10-CM

## 2013-04-03 DIAGNOSIS — I63239 Cerebral infarction due to unspecified occlusion or stenosis of unspecified carotid arteries: Principal | ICD-10-CM | POA: Diagnosis present

## 2013-04-03 DIAGNOSIS — E785 Hyperlipidemia, unspecified: Secondary | ICD-10-CM | POA: Diagnosis present

## 2013-04-03 DIAGNOSIS — I252 Old myocardial infarction: Secondary | ICD-10-CM

## 2013-04-03 LAB — URINALYSIS, ROUTINE W REFLEX MICROSCOPIC
BILIRUBIN URINE: NEGATIVE
Glucose, UA: NEGATIVE mg/dL
Hgb urine dipstick: NEGATIVE
Ketones, ur: NEGATIVE mg/dL
Leukocytes, UA: NEGATIVE
NITRITE: NEGATIVE
PH: 6 (ref 5.0–8.0)
Protein, ur: 30 mg/dL — AB
Specific Gravity, Urine: 1.007 (ref 1.005–1.030)
Urobilinogen, UA: 0.2 mg/dL (ref 0.0–1.0)

## 2013-04-03 LAB — RAPID URINE DRUG SCREEN, HOSP PERFORMED
Amphetamines: NOT DETECTED
Barbiturates: NOT DETECTED
Benzodiazepines: NOT DETECTED
COCAINE: NOT DETECTED
OPIATES: NOT DETECTED
Tetrahydrocannabinol: NOT DETECTED

## 2013-04-03 LAB — COMPREHENSIVE METABOLIC PANEL
ALT: 15 U/L (ref 0–53)
AST: 23 U/L (ref 0–37)
Albumin: 3.3 g/dL — ABNORMAL LOW (ref 3.5–5.2)
Alkaline Phosphatase: 116 U/L (ref 39–117)
BUN: 16 mg/dL (ref 6–23)
CALCIUM: 10.1 mg/dL (ref 8.4–10.5)
CHLORIDE: 103 meq/L (ref 96–112)
CO2: 27 meq/L (ref 19–32)
CREATININE: 0.96 mg/dL (ref 0.50–1.35)
GFR calc Af Amer: 88 mL/min — ABNORMAL LOW (ref >90)
GFR, EST NON AFRICAN AMERICAN: 76 mL/min — AB (ref >90)
Glucose, Bld: 134 mg/dL — ABNORMAL HIGH (ref 70–99)
Potassium: 5.3 mEq/L (ref 3.7–5.3)
Sodium: 144 mEq/L (ref 137–147)
Total Bilirubin: 0.3 mg/dL (ref 0.3–1.2)
Total Protein: 7.1 g/dL (ref 6.0–8.3)

## 2013-04-03 LAB — CBC
HEMATOCRIT: 34.1 % — AB (ref 39.0–52.0)
HEMATOCRIT: 34.7 % — AB (ref 39.0–52.0)
HEMOGLOBIN: 11.1 g/dL — AB (ref 13.0–17.0)
Hemoglobin: 11.6 g/dL — ABNORMAL LOW (ref 13.0–17.0)
MCH: 28.2 pg (ref 26.0–34.0)
MCH: 28.5 pg (ref 26.0–34.0)
MCHC: 32.6 g/dL (ref 30.0–36.0)
MCHC: 33.4 g/dL (ref 30.0–36.0)
MCV: 86.5 fL (ref 78.0–100.0)
MCV: 85.3 fL (ref 78.0–100.0)
Platelets: 240 10*3/uL (ref 150–400)
Platelets: 235 10*3/uL (ref 150–400)
RBC: 3.94 MIL/uL — ABNORMAL LOW (ref 4.22–5.81)
RBC: 4.07 MIL/uL — ABNORMAL LOW (ref 4.22–5.81)
RDW: 15.8 % — AB (ref 11.5–15.5)
RDW: 15.6 % — ABNORMAL HIGH (ref 11.5–15.5)
WBC: 6.3 10*3/uL (ref 4.0–10.5)
WBC: 8.6 10*3/uL (ref 4.0–10.5)

## 2013-04-03 LAB — DIFFERENTIAL
BASOS ABS: 0 10*3/uL (ref 0.0–0.1)
Basophils Relative: 0 % (ref 0–1)
EOS PCT: 2 % (ref 0–5)
Eosinophils Absolute: 0.1 10*3/uL (ref 0.0–0.7)
Lymphocytes Relative: 17 % (ref 12–46)
Lymphs Abs: 1.1 10*3/uL (ref 0.7–4.0)
MONO ABS: 0.4 10*3/uL (ref 0.1–1.0)
MONOS PCT: 6 % (ref 3–12)
NEUTROS ABS: 4.7 10*3/uL (ref 1.7–7.7)
Neutrophils Relative %: 75 % (ref 43–77)

## 2013-04-03 LAB — APTT: aPTT: 24 seconds (ref 24–37)

## 2013-04-03 LAB — CREATININE, SERUM
CREATININE: 0.95 mg/dL (ref 0.50–1.35)
GFR, EST AFRICAN AMERICAN: 89 mL/min — AB (ref >90)
GFR, EST NON AFRICAN AMERICAN: 77 mL/min — AB (ref >90)

## 2013-04-03 LAB — PROTIME-INR
INR: 1.01 (ref 0.00–1.49)
Prothrombin Time: 13.1 seconds (ref 11.6–15.2)

## 2013-04-03 LAB — I-STAT TROPONIN, ED: Troponin i, poc: 0.05 ng/mL (ref 0.00–0.08)

## 2013-04-03 LAB — URINE MICROSCOPIC-ADD ON

## 2013-04-03 LAB — GLUCOSE, CAPILLARY: Glucose-Capillary: 104 mg/dL — ABNORMAL HIGH (ref 70–99)

## 2013-04-03 LAB — ETHANOL: Alcohol, Ethyl (B): <11 mg/dL (ref 0–11)

## 2013-04-03 MED ORDER — CARVEDILOL 6.25 MG PO TABS: 6.2500 mg | ORAL_TABLET | Freq: Two times a day (BID) | ORAL | Status: DC

## 2013-04-03 MED ORDER — HEPARIN SODIUM (PORCINE) 5000 UNIT/ML IJ SOLN
5000.0000 [IU] | Freq: Three times a day (TID) | INTRAMUSCULAR | Status: DC
  Administered 2013-04-03 – 2013-04-10 (×18): 5000 [IU] via SUBCUTANEOUS
  Filled 2013-04-03 (×26): qty 1

## 2013-04-03 MED ORDER — PNEUMOCOCCAL VAC POLYVALENT 25 MCG/0.5ML IJ INJ
0.5000 mL | INJECTION | INTRAMUSCULAR | Status: AC
  Administered 2013-04-04: 0.5 mL via INTRAMUSCULAR
  Filled 2013-04-03: qty 0.5

## 2013-04-03 MED ORDER — ASPIRIN EC 81 MG PO TBEC
81.0000 mg | DELAYED_RELEASE_TABLET | Freq: Every day | ORAL | Status: DC
  Administered 2013-04-03: 81 mg via ORAL
  Filled 2013-04-03 (×2): qty 1

## 2013-04-03 MED ORDER — PANTOPRAZOLE SODIUM 40 MG PO TBEC
40.0000 mg | DELAYED_RELEASE_TABLET | Freq: Every day | ORAL | Status: DC
  Administered 2013-04-04 – 2013-04-15 (×11): 40 mg via ORAL
  Filled 2013-04-03 (×10): qty 1

## 2013-04-03 MED ORDER — ATORVASTATIN CALCIUM 80 MG PO TABS
80.0000 mg | ORAL_TABLET | Freq: Every day | ORAL | Status: DC
  Administered 2013-04-05 – 2013-04-15 (×10): 80 mg via ORAL
  Filled 2013-04-03 (×12): qty 1

## 2013-04-03 MED ORDER — NITROGLYCERIN 0.4 MG SL SUBL: 0.4000 mg | SUBLINGUAL_TABLET | SUBLINGUAL | Status: DC | PRN

## 2013-04-03 MED ORDER — CARVEDILOL 6.25 MG PO TABS
6.2500 mg | ORAL_TABLET | Freq: Two times a day (BID) | ORAL | Status: AC
  Administered 2013-04-03: 6.25 mg via ORAL
  Filled 2013-04-03 (×3): qty 1

## 2013-04-03 MED ORDER — INSULIN ASPART 100 UNIT/ML ~~LOC~~ SOLN
0.0000 [IU] | Freq: Three times a day (TID) | SUBCUTANEOUS | Status: DC
  Administered 2013-04-05: 2 [IU] via SUBCUTANEOUS
  Administered 2013-04-06: 1 [IU] via SUBCUTANEOUS
  Administered 2013-04-06: 2 [IU] via SUBCUTANEOUS
  Administered 2013-04-07: 1 [IU] via SUBCUTANEOUS
  Administered 2013-04-08 – 2013-04-10 (×4): 2 [IU] via SUBCUTANEOUS
  Administered 2013-04-12 – 2013-04-14 (×2): 1 [IU] via SUBCUTANEOUS
  Administered 2013-04-14: 2 [IU] via SUBCUTANEOUS
  Administered 2013-04-14: 1 [IU] via SUBCUTANEOUS
  Administered 2013-04-15: 2 [IU] via SUBCUTANEOUS

## 2013-04-03 MED ORDER — INFLUENZA VAC SPLIT QUAD 0.5 ML IM SUSP
0.5000 mL | INTRAMUSCULAR | Status: AC
  Administered 2013-04-04: 0.5 mL via INTRAMUSCULAR
  Filled 2013-04-03 (×2): qty 0.5

## 2013-04-03 MED ORDER — SODIUM CHLORIDE 0.9 % IV SOLN
INTRAVENOUS | Status: DC
  Administered 2013-04-03: 23:00:00 via INTRAVENOUS
  Administered 2013-04-10: 1000 mL via INTRAVENOUS

## 2013-04-03 MED ORDER — DEXTROSE 50 % IV SOLN
INTRAVENOUS | Status: AC
  Filled 2013-04-03: qty 50

## 2013-04-03 MED ORDER — SENNOSIDES-DOCUSATE SODIUM 8.6-50 MG PO TABS
1.0000 | ORAL_TABLET | Freq: Every evening | ORAL | Status: DC | PRN
  Administered 2013-04-05: 1 via ORAL
  Filled 2013-04-03 (×2): qty 1

## 2013-04-03 MED ORDER — CARVEDILOL 12.5 MG PO TABS
12.5000 mg | ORAL_TABLET | Freq: Two times a day (BID) | ORAL | Status: DC
  Administered 2013-04-04 – 2013-04-15 (×23): 12.5 mg via ORAL
  Filled 2013-04-03 (×27): qty 1

## 2013-04-03 MED ORDER — CLOPIDOGREL BISULFATE 75 MG PO TABS
75.0000 mg | ORAL_TABLET | Freq: Every day | ORAL | Status: DC
  Administered 2013-04-03 – 2013-04-15 (×12): 75 mg via ORAL
  Filled 2013-04-03 (×18): qty 1

## 2013-04-03 MED ORDER — RAMIPRIL 5 MG PO CAPS
5.0000 mg | ORAL_CAPSULE | Freq: Every day | ORAL | Status: DC
  Administered 2013-04-05 – 2013-04-08 (×4): 5 mg via ORAL
  Filled 2013-04-03 (×5): qty 1

## 2013-04-03 MED ORDER — HYDRALAZINE HCL 20 MG/ML IJ SOLN
5.0000 mg | Freq: Once | INTRAMUSCULAR | Status: AC
  Administered 2013-04-03: 5 mg via INTRAVENOUS
  Filled 2013-04-03: qty 1

## 2013-04-03 MED ORDER — ISOSORBIDE MONONITRATE ER 60 MG PO TB24
60.0000 mg | ORAL_TABLET | Freq: Every day | ORAL | Status: DC
  Filled 2013-04-03: qty 1

## 2013-04-03 NOTE — H&P (Signed)
[name] is an 80 y.o. male.   Chief Complaint: Left-sided weakness associated with slurred speech  HPI: Patient is 80 year old male with past medical history significant for multiple medical problems i.e. 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 Patient denies any seizure activities. Denies any headaches in ED his motor strength is improved but continues to have slurred speech. Denies any blurring of vision but states he feels numb all over. Patient denies any palpitation lightheadedness or syncope. Denies any chest pain denies shortness of breath  Past Medical History  Diagnosis Date  . Hypercholesteremia     takes Lipitor daily  . CAD (coronary artery disease)     s/p PTCA to PLV branch in 2002  . Non-insulin dependent diabetes mellitus   . PVD (peripheral vascular disease)     s/p right BKA  . CVA (cerebral infarction)     s/p PTA to middle cerebral artery;slurred speech  . HTN (hypertension)     takes Benazepril daily  . Pacemaker   . Shortness of breath     sitting as well as standing  . Glaucoma   . Complete heart block     s/p Medtronic PPM by JA 09/2009  . Stroke     Past Surgical History  Procedure Laterality Date  . Pacemaker insertion  09/2010  . Appendectomy    . Amputation      below amputation  . Cardiac catheterization      2002/2009  . Amputation  08/02/2011   Procedure: AMPUTATION BELOW KNEE;  Surgeon: Rosetta Posner, MD;  Location: [facility];  Service: Vascular;  Laterality: Left;    Family History  Problem Relation Age of Onset  . Heart failure Mother   . Heart failure Father   . Prostate cancer Brother    Social History:  reports that he has never smoked. He has never used smokeless tobacco. He reports that he does not drink alcohol or use illicit drugs.  Allergies: No Known Allergies  Medications Prior to Admission  Medication Sig Dispense Refill  . acetaminophen (TYLENOL) 325 MG tablet Take 325 mg by mouth every 6 (six) hours as needed for mild pain.      Marland Kitchen aspirin EC 81 MG tablet Take 81 mg by mouth daily.      Marland Kitchen atorvastatin (LIPITOR) 80 MG tablet Take 80 mg by mouth daily.      . carvedilol (COREG) 6.25 MG tablet Take 6.25 mg by mouth 2 (two) times daily with a meal.      . clopidogrel (PLAVIX) 75 MG tablet Take 75 mg by mouth daily.      . furosemide (LASIX) 40 MG tablet Take 40 mg by mouth daily.      Marland Kitchen ibuprofen (ADVIL,MOTRIN) 200 MG tablet Take 200 mg by mouth daily as needed for mild pain.      . isosorbide mononitrate (IMDUR) 60 MG 24 hr tablet Take 60 mg by mouth daily.      . nitroGLYCERIN (NITROSTAT)  0.4 MG SL tablet Place 0.4 mg under the tongue every 5 (five) minutes as needed for chest pain.      . pantoprazole (PROTONIX) 40 MG tablet Take 40 mg by mouth daily.      . ramipril (ALTACE) 5 MG capsule Take 5 mg by mouth daily.      . traZODone (DESYREL) 100 MG tablet Take 100 mg by mouth at bedtime as needed for sleep.        Results for orders placed during the hospital encounter of 04/03/13 (from the past 48 hour(s))  ETHANOL     Status: None   Collection Time    04/03/13  9:20 AM      Result Value Ref Range   Alcohol, Ethyl (B) <11  0 - 11 mg/dL   Comment:            LOWEST DETECTABLE LIMIT FOR     SERUM ALCOHOL IS 11 mg/dL     FOR MEDICAL PURPOSES ONLY  PROTIME-INR     Status: None   Collection Time    04/03/13   9:20 AM      Result Value Ref Range   Prothrombin Time 13.1  11.6 - 15.2 seconds   INR 1.01  0.00 - 1.49  APTT     Status: None   Collection Time    04/03/13  9:20 AM      Result Value Ref Range   aPTT 24  24 - 37 seconds  CBC     Status: Abnormal   Collection Time    04/03/13  9:20 AM      Result Value Ref Range   WBC 6.3  4.0 - 10.5 K/uL   RBC 3.94 (*) 4.22 - 5.81 MIL/uL   Hemoglobin 11.1 (*) 13.0 - 17.0 g/dL   HCT 34.1 (*) 39.0 - 52.0 %   MCV 86.5  78.0 - 100.0 fL   MCH 28.2  26.0 - 34.0 pg   MCHC 32.6  30.0 - 36.0 g/dL   RDW 15.8 (*) 11.5 - 15.5 %   Platelets 240  150 - 400 K/uL  DIFFERENTIAL     Status: None   Collection Time    04/03/13  9:20 AM      Result Value Ref Range   Neutrophils Relative % 75  43 - 77 %   Neutro Abs 4.7  1.7 - 7.7 K/uL   Lymphocytes Relative 17  12 - 46 %   Lymphs Abs 1.1  0.7 - 4.0 K/uL   Monocytes Relative 6  3 - 12 %   Monocytes Absolute 0.4  0.1 - 1.0 K/uL   Eosinophils Relative 2  0 - 5 %   Eosinophils Absolute 0.1  0.0 - 0.7 K/uL   Basophils Relative 0  0 - 1 %   Basophils Absolute 0.0  0.0 - 0.1 K/uL  COMPREHENSIVE METABOLIC PANEL     Status: Abnormal   Collection Time    04/03/13  9:20 AM      Result Value Ref Range   Sodium 144  137 - 147 mEq/L   Potassium 5.3  3.7 - 5.3 mEq/L   Chloride 103  96 - 112 mEq/L   CO2 27  19 - 32 mEq/L   Glucose, Bld 134 (*) 70 - 99 mg/dL   BUN 16  6 - 23 mg/dL   Creatinine, Ser 0.96  0.50 - 1.35 mg/dL   Calcium 10.1  8.4 - 10.5 mg/dL   Total  Protein 7.1  6.0 - 8.3 g/dL   Albumin 3.3 (*) 3.5 - 5.2 g/dL   AST 23  0 - 37 U/L   Comment: HEMOLYSIS AT THIS LEVEL MAY AFFECT RESULT   ALT 15  0 - 53 U/L   Alkaline Phosphatase 116  39 - 117 U/L   Total Bilirubin 0.3  0.3 - 1.2 mg/dL   GFR calc non Af Amer 76 (*) >90 mL/min   GFR calc Af Amer 88 (*) >90 mL/min   Comment: (NOTE)     The eGFR has been calculated using the CKD EPI equation.     This calculation has not been validated in all clinical  situations.     eGFR's persistently <90 mL/min signify possible Chronic Kidney     Disease.  URINE RAPID DRUG SCREEN (HOSP PERFORMED)     Status: None   Collection Time    04/03/13  9:36 AM      Result Value Ref Range   Opiates NONE DETECTED  NONE DETECTED   Cocaine NONE DETECTED  NONE DETECTED   Benzodiazepines NONE DETECTED  NONE DETECTED   Amphetamines NONE DETECTED  NONE DETECTED   Tetrahydrocannabinol NONE DETECTED  NONE DETECTED   Barbiturates NONE DETECTED  NONE DETECTED   Comment:            DRUG SCREEN FOR MEDICAL PURPOSES     ONLY.  IF CONFIRMATION IS NEEDED     FOR ANY PURPOSE, NOTIFY LAB     WITHIN 5 DAYS.                LOWEST DETECTABLE LIMITS     FOR URINE DRUG SCREEN     Drug Class       Cutoff (ng/mL)     Amphetamine      1000     Barbiturate      200     Benzodiazepine   737     Tricyclics       106     Opiates          300     Cocaine          300     THC              50  URINALYSIS, ROUTINE W REFLEX MICROSCOPIC     Status: Abnormal   Collection Time    04/03/13  9:36 AM      Result Value Ref Range   Color, Urine YELLOW  YELLOW   APPearance CLEAR  CLEAR   Specific Gravity, Urine 1.007  1.005 - 1.030   pH 6.0  5.0 - 8.0   Glucose, UA NEGATIVE  NEGATIVE mg/dL   Hgb urine dipstick NEGATIVE  NEGATIVE   Bilirubin Urine NEGATIVE  NEGATIVE   Ketones, ur NEGATIVE  NEGATIVE mg/dL   Protein, ur 30 (*) NEGATIVE mg/dL   Urobilinogen, UA 0.2  0.0 - 1.0 mg/dL   Nitrite NEGATIVE  NEGATIVE   Leukocytes, UA NEGATIVE  NEGATIVE  URINE MICROSCOPIC-ADD ON     Status: None   Collection Time    04/03/13  9:36 AM      Result Value Ref Range   WBC, UA 0-2  <3 WBC/hpf   RBC / HPF 0-2  <3 RBC/hpf  I-STAT TROPOININ, ED     Status: None   Collection Time    04/03/13  9:43 AM      Result Value Ref Range   Troponin i, poc 0.05  0.00 - 0.08 ng/mL   Comment 3            Comment: Due to the release kinetics of cTnI,     a negative result within the first hours     of the  onset of symptoms does not rule out     myocardial infarction with certainty.     If myocardial infarction is still suspected,     repeat the test at appropriate intervals.   Ct Head Wo Contrast  04/03/2013   CLINICAL DATA:  Slurred speech and altered mental status  EXAM: CT HEAD WITHOUT CONTRAST  TECHNIQUE: Contiguous axial images were obtained from the base of the skull through the vertex without intravenous contrast. Study was obtained within 24 hr of patient's arrival at the emergency department.  COMPARISON:  April 14, 2012  FINDINGS: Moderate diffuse atrophy is stable. There is slightly greater atrophy in the cerebellum than elsewhere. A stable connection of the inferior fourth ventricle with the cisterna magna, a congenital Dandy-Walker type variant which is stable.  There is no mass, hemorrhage, extra-axial fluid collection, or midline shift. There is mild small vessel disease in the centra semiovale bilaterally. There is a prior small lacunar infarct in the periphery of the left mid cerebellum. There is a stable prior lacunar infarct in the superior aspect of the head of the caudate nucleus on right. There is no acute appearing infarct.  Bony calvarium appears intact.  Mastoid air cells are clear.  IMPRESSION: Atrophy with mild small vessel disease. Prior small lacunar infarcts in the mid left cerebellum in superior aspect of the head of the caudate nucleus on the right. There is no intracranial mass, hemorrhage, or acute appearing infarct.   Electronically Signed   By: Lowella Grip M.D.   On: 04/03/2013 10:46    Review of Systems  Constitutional: Positive for fever. Negative for chills.  Eyes: Negative for blurred vision and double vision.  Cardiovascular: Negative for chest pain and palpitations.  Gastrointestinal: Negative for nausea, vomiting and abdominal pain.  Genitourinary: Negative for dysuria and urgency.  Neurological: Negative for dizziness and headaches.    Blood pressure  197/99, pulse 85, temperature 98.2 F (36.8 C), temperature source Oral, resp. rate 18, height 5' (1.524 m), weight 72.576 kg (160 lb), SpO2 100.00%. Physical Exam  Constitutional: He is oriented to person, place, and time.  HENT:  Head: Normocephalic and atraumatic.  Eyes: Conjunctivae are normal. Left eye exhibits no discharge. No scleral icterus.  Neck: Normal range of motion. Neck supple. No JVD present. No tracheal deviation present. No thyromegaly present.  Cardiovascular: Normal rate and regular rhythm.   Murmur (Soft systolic murmur noted no S3 gallop) heard. Respiratory: Effort normal and breath sounds normal. No respiratory distress. He has no wheezes. He has no rales.  GI: Soft. Bowel sounds are normal. He exhibits no distension. There is no tenderness.  Musculoskeletal:  No clubbing cyanosis bilateral below-knee amputation noted  Neurological: He is alert and oriented to person, place, and time.  Motor strength 5 over 5 and right upper and lower extremity and 4+ over 5 in left upper extremity and 5 over 5 in left lower extremity     Assessment/Plan Rule out recurrent CVA History of CVA in the past status post PTA to major cerebral artery in the past Coronary artery disease history of MI in the past Ischemic cardiomyopathy Compensated systolic heart failure in History of complete heart block status post permanent pacemaker Hypertension Non-insulin-dependent diabetes  mellitus Severe peripheral vascular disease status post bilateral below-knee amputation in the past Hypercholesteremia Plan as per orders    [facility] N 04/03/2013, 5:35 PM

## 2013-04-03 NOTE — Consult Note (Signed)
Neurology Consultation Reason for Consult: New-onset weakness Referring Physician: Silverio Lay, D  CC: Left-sided weakness  History is obtained from: Patient, son  HPI: [name] is a 80 y.o. male told this morning with symptoms of difficulty moving. He states that it was "all over" and requested to be brought to the emergency room out of concern for new stroke.  Since arrival, he has had some improvement, however he has persistent new slurred speech as well as some mild left-sided weakness which he states is new .  LKW: 9:30 pm 3/18 tpa given?: no, outside of window   ROS: A 14 point ROS was performed and is negative except as noted in the HPI.  Past Medical History  Diagnosis Date  . Hypercholesteremia     takes Lipitor daily  . CAD (coronary artery disease)     s/p PTCA to PLV branch in 2002  . Non-insulin dependent diabetes mellitus   . PVD (peripheral vascular disease)     s/p right BKA  . CVA (cerebral infarction)     s/p PTA to middle cerebral artery;slurred speech  . HTN (hypertension)     takes Benazepril daily  . Pacemaker   . Shortness of breath     sitting as well as standing  . Glaucoma   . Complete heart block     s/p Medtronic PPM by JA 09/2009  . Stroke     Family History: Heart failure  Social History: Tob: Does not smoke  Exam: Current vital signs: BP 185/89  Pulse 64  Temp(Src) 98 F (36.7 C) (Oral)  Resp 18  Ht 5' (1.524 m)  Wt 72.576 kg (160 lb)  BMI 31.25 kg/m2  SpO2 100% Vital signs in last 24 hours: Temp:  [98 F (36.7 C)] 98 F (36.7 C) (03/19 0905) Pulse Rate:  [64] 64 (03/19 0905) Resp:  [18] 18 (03/19 0905) BP: (185)/(89) 185/89 mmHg (03/19 0905) SpO2:  [100 %] 100 % (03/19 0905) Weight:  [72.576 kg (160 lb)] 72.576 kg (160 lb) (03/19 0905)  General: in bed, NAD CV: RRR Mental Status: Patient is awake, alert, oriented to person, place but not month or year.  No signs of aphasia or neglect Cranial Nerves: II: Visual Fields  are full. Pupils are equal, round, and reactive to light.  Discs are difficult to visualize. III,IV, VI: EOMI without ptosis or diploplia.  V: Facial sensation is symmetric to temperature VII: Facial movement is notable for very mild lag on teh left with smile.  VIII: hearing is intact to voice X: Uvula elevates symmetrically XI: Shoulder shrug is symmetric. XII: tongue is midline without atrophy or fasciculations.  Motor: Tone is normal. Bulk is normal. 5/5 strength was present in the right leg, mild right arm paresis, left arm with mild pronator drift 4+/5 and 4/5 left leg weakness.  Sensory: Sensation is symmetric to light touch and temperature in the arms and legs. Deep Tendon Reflexes: 2+ and symmetric in the biceps and patellae.  Cerebellar: FNF consistent with weakness.  Gait: Not assessed due  multiple medical monitors in ED setting.   I have reviewed labs in epic and the results pertinent to this consultation are: UA - negative  I have reviewed the images obtained:CT head - no acute findings.   Impression: 80 yo M with new left sided weakness and slurred speech that started on awakening, went to bed around 9:30pm. I suspect recurrent cva.    Recommendations: 1. HgbA1c, fasting lipid panel 2. Repeat CT in  48 hours, if Cr is ok, would do CTA head and neck.  3. Frequent neuro checks 4. Echocardiogram 5. Carotid dopplers 6. Prophylactic therapy-Antiplatelet med: Aspirin - dose 81mg  and plavix 75mg .  7. Risk factor modification 8. Telemetry monitoring 9. PT consult, OT consult, Speech consult    Ritta SlotMcNeill Kirkpatrick, MD [facility] [phone]  If 7pm- 7am, please page neurology on call at [phone].

## 2013-04-03 NOTE — ED Provider Notes (Addendum)
CSN: [id number]     Arrival date & time 04/03/13  4098 History   First MD Initiated Contact with Patient 04/03/13 [phone]     Chief Complaint  Patient presents with  . Weakness     (Consider location/radiation/quality/duration/timing/severity/associated sxs/prior Treatment) The history is provided by the patient.  [name] is a 80 y.o. male hx of HL, HTN, CAD, CVA here with weakness, slurred speech. He woke up this morning around 6:30 AM and family noted that he had worsening weakness. He also was noted to have some slurred speech as well. He had a history of stroke and wife thought he may have another stroke.    Past Medical History  Diagnosis Date  . Hypercholesteremia     takes Lipitor daily  . CAD (coronary artery disease)     s/p PTCA to PLV branch in 2002  . Non-insulin dependent diabetes mellitus   . PVD (peripheral vascular disease)     s/p right BKA  . CVA (cerebral infarction)     s/p PTA to middle cerebral artery;slurred speech  . HTN (hypertension)     takes Benazepril daily  . Pacemaker   . Shortness of breath     sitting as well as standing  . Glaucoma   . Complete heart block     s/p Medtronic PPM by JA 09/2009  . Stroke    Past Surgical History  Procedure Laterality Date  . Pacemaker insertion  09/2010  . Appendectomy    . Amputation      below amputation  . Cardiac catheterization      2002/2009  . Amputation  08/02/2011    Procedure: AMPUTATION BELOW KNEE;  Surgeon: Larina Earthly, MD;  Location: [facility] OR;  Service: Vascular;  Laterality: Left;   Family History  Problem Relation Age of Onset  . Heart failure Mother   . Heart failure Father   . Prostate cancer Brother    History  Substance Use Topics  . Smoking status: Never Smoker   . Smokeless tobacco: Never Used  . Alcohol Use: No    Review of Systems  Neurological: Positive for speech difficulty and weakness.  All other systems reviewed and are negative.      Allergies  Review of  patient's allergies indicates no known allergies.  Home Medications   Current Outpatient Rx  Name  Route  Sig  Dispense  Refill  . acetaminophen (TYLENOL) 325 MG tablet   Oral   Take 325 mg by mouth every 6 (six) hours as needed for mild pain.         Marland Kitchen aspirin EC 81 MG tablet   Oral   Take 81 mg by mouth daily.         Marland Kitchen atorvastatin (LIPITOR) 80 MG tablet   Oral   Take 80 mg by mouth daily.         . carvedilol (COREG) 6.25 MG tablet   Oral   Take 6.25 mg by mouth 2 (two) times daily with a meal.         . clopidogrel (PLAVIX) 75 MG tablet   Oral   Take 75 mg by mouth daily.         . furosemide (LASIX) 40 MG tablet   Oral   Take 40 mg by mouth daily.         Marland Kitchen ibuprofen (ADVIL,MOTRIN) 200 MG tablet   Oral   Take 200 mg by mouth daily as needed for mild pain.         Marland Kitchen  isosorbide mononitrate (IMDUR) 60 MG 24 hr tablet   Oral   Take 60 mg by mouth daily.         . nitroGLYCERIN (NITROSTAT) 0.4 MG SL tablet   Sublingual   Place 0.4 mg under the tongue every 5 (five) minutes as needed for chest pain.         . pantoprazole (PROTONIX) 40 MG tablet   Oral   Take 40 mg by mouth daily.         . ramipril (ALTACE) 5 MG capsule   Oral   Take 5 mg by mouth daily.         . traZODone (DESYREL) 100 MG tablet   Oral   Take 100 mg by mouth at bedtime as needed for sleep.          BP 191/95  Pulse 64  Temp(Src) 98.5 F (36.9 C) (Oral)  Resp 24  Ht 5' (1.524 m)  Wt 160 lb (72.576 kg)  BMI 31.25 kg/m2  SpO2 100% Physical Exam  Nursing note and vitals reviewed. Constitutional:  Chronically ill   HENT:  Head: Normocephalic.  Mouth/Throat: Oropharynx is clear and moist.  Eyes: Conjunctivae are normal. Pupils are equal, round, and reactive to light.  Neck: Normal range of motion. Neck supple.  Cardiovascular: Normal rate, regular rhythm and normal heart sounds.   Pulmonary/Chest: Effort normal and breath sounds normal. No respiratory  distress. He has no wheezes.  Abdominal: Soft. Bowel sounds are normal. He exhibits no distension. There is no tenderness. There is no guarding.  Musculoskeletal: Normal range of motion. He exhibits edema.  Bilateral AKA   Neurological: He is alert.  R facial droop. Dec strength L arm and leg.   Skin: Skin is warm and dry.  Psychiatric: He has a normal mood and affect. His behavior is normal. Judgment and thought content normal.    ED Course  Procedures (including critical care time) Labs Review Labs Reviewed  CBC - Abnormal; Notable for the following:    RBC 3.94 (*)    Hemoglobin 11.1 (*)    HCT 34.1 (*)    RDW 15.8 (*)    All other components within normal limits  URINALYSIS, ROUTINE W REFLEX MICROSCOPIC - Abnormal; Notable for the following:    Protein, ur 30 (*)    All other components within normal limits  PROTIME-INR  APTT  DIFFERENTIAL  URINE RAPID DRUG SCREEN (HOSP PERFORMED)  URINE MICROSCOPIC-ADD ON  ETHANOL  COMPREHENSIVE METABOLIC PANEL  I-STAT CHEM 8, ED  I-STAT TROPOININ, ED  I-STAT TROPOININ, ED   Imaging Review Ct Head Wo Contrast  04/03/2013   CLINICAL DATA:  Slurred speech and altered mental status  EXAM: CT HEAD WITHOUT CONTRAST  TECHNIQUE: Contiguous axial images were obtained from the base of the skull through the vertex without intravenous contrast. Study was obtained within 24 hr of patient's arrival at the emergency department.  COMPARISON:  April 14, 2012  FINDINGS: Moderate diffuse atrophy is stable. There is slightly greater atrophy in the cerebellum than elsewhere. A stable connection of the inferior fourth ventricle with the cisterna magna, a congenital Dandy-Walker type variant which is stable.  There is no mass, hemorrhage, extra-axial fluid collection, or midline shift. There is mild small vessel disease in the centra semiovale bilaterally. There is a prior small lacunar infarct in the periphery of the left mid cerebellum. There is a stable prior  lacunar infarct in the superior aspect of the head of the caudate  nucleus on right. There is no acute appearing infarct.  Bony calvarium appears intact.  Mastoid air cells are clear.  IMPRESSION: Atrophy with mild small vessel disease. Prior small lacunar infarcts in the mid left cerebellum in superior aspect of the head of the caudate nucleus on the right. There is no intracranial mass, hemorrhage, or acute appearing infarct.   Electronically Signed   By: Bretta Bang M.D.   On: 04/03/2013 10:46     EKG Interpretation   Date/Time:  Thursday April 03 2013 09:11:15 EDT Ventricular Rate:  72 PR Interval:  214 QRS Duration: 177 QT Interval:  485 QTC Calculation: 531 R Axis:   -72 Text Interpretation:  Atrial-sensed ventricular-paced rhythm No further  analysis attempted due to paced rhythm No significant change since last  tracing Confirmed by YAO  MD, DAVID ([id number]) on 04/03/2013 10:14:29 AM      MDM   Final diagnoses:  Left-sided weakness   [name] is a 80 y.o. male here with stroke symptoms. Last normal was last night so was outside the window for TPA. Will do stroke workup and call neuro.   2:21 PM Neuro saw patient, recommend stroke workup. I called Dr. Sharyn Lull, who will admit.     Richardean Canal, MD 04/03/13 1421  Richardean Canal, MD 04/03/13 [phone]

## 2013-04-03 NOTE — ED Notes (Signed)
[facility].  Patient had increased weakness starting at approximately 0630 this morning and family thought he may be having a TIA.  Also reports decreased appetite today.

## 2013-04-04 ENCOUNTER — Inpatient Hospital Stay (HOSPITAL_COMMUNITY): Payer: Medicare Other

## 2013-04-04 DIAGNOSIS — I6350 Cerebral infarction due to unspecified occlusion or stenosis of unspecified cerebral artery: Secondary | ICD-10-CM

## 2013-04-04 LAB — GLUCOSE, CAPILLARY
GLUCOSE-CAPILLARY: 85 mg/dL (ref 70–99)
GLUCOSE-CAPILLARY: 107 mg/dL — AB (ref 70–99)
Glucose-Capillary: 112 mg/dL — ABNORMAL HIGH (ref 70–99)
Glucose-Capillary: 142 mg/dL — ABNORMAL HIGH (ref 70–99)

## 2013-04-04 LAB — LIPID PANEL
CHOL/HDL RATIO: 2.4 ratio
CHOLESTEROL: 125 mg/dL (ref 0–200)
HDL: 52 mg/dL (ref >39)
LDL Cholesterol: 63 mg/dL (ref 0–99)
Triglycerides: 49 mg/dL (ref <150)
VLDL: 10 mg/dL (ref 0–40)

## 2013-04-04 MED ORDER — ASPIRIN 300 MG RE SUPP
300.0000 mg | Freq: Once | RECTAL | Status: DC
  Filled 2013-04-04: qty 1

## 2013-04-04 MED ORDER — ASPIRIN 300 MG RE SUPP
150.0000 mg | Freq: Every day | RECTAL | Status: DC
  Administered 2013-04-04: 150 mg via RECTAL
  Filled 2013-04-04 (×2): qty 1

## 2013-04-04 MED ORDER — NITROGLYCERIN 0.4 MG/HR TD PT24
0.4000 mg | MEDICATED_PATCH | Freq: Every day | TRANSDERMAL | Status: DC
  Administered 2013-04-04 – 2013-04-15 (×12): 0.4 mg via TRANSDERMAL
  Filled 2013-04-04 (×13): qty 1

## 2013-04-04 MED ORDER — HALOPERIDOL LACTATE 5 MG/ML IJ SOLN
0.5000 mg | Freq: Once | INTRAMUSCULAR | Status: AC
  Administered 2013-04-05: 0.5 mg via INTRAVENOUS
  Filled 2013-04-04: qty 1

## 2013-04-04 MED ORDER — IOHEXOL 350 MG/ML SOLN
50.0000 mL | Freq: Once | INTRAVENOUS | Status: AC | PRN
  Administered 2013-04-04: 50 mL via INTRAVENOUS

## 2013-04-04 NOTE — Evaluation (Signed)
Physical Therapy Evaluation Patient Details Name: [name] MRN: [id number] DOB: 03/27/1931 Today's Date: 04/04/2013 Time: [id number] PT Time Calculation (min): 34 min  PT Assessment / Plan / Recommendation History of Present Illness  Patient is 80 year old male with past medical history significant for multiple medical problems i.e. 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 Patient denies any seizure activities. Denies any headaches in ED his motor strength is improved but continues to have slurred speech. Denies any blurring of vision but states he feels numb all over. Patient denies any palpitation lightheadedness or syncope. Denies any chest pain denies shortness of breath  Clinical Impression  Pt admitted with slurred speech. Pt currently with functional limitations due to the deficits listed below (see PT Problem List). Pt will benefit from skilled PT to increase their independence and safety with mobility to allow discharge to the venue listed below. Pt with increased weakness in RUE and inattention.  Increased expressive difficulties noted.    PT Assessment  Patient needs continued PT services    Follow Up Recommendations  CIR;Supervision/Assistance - 24 hour    Does the patient have the potential to tolerate intense rehabilitation      Barriers to Discharge        Equipment  Recommendations  None recommended by PT    Recommendations for Other Services Rehab consult   Frequency Min 4X/week    Precautions / Restrictions Precautions Precautions: Fall Required Braces or Orthoses:  (bil lower extremitiy prostheses) Restrictions Weight Bearing Restrictions: Yes   Pertinent Vitals/Pain See flowsheet; pt denied pain      Mobility  Bed Mobility Overal bed mobility: Needs Assistance Bed Mobility: Supine to Sit;Sit to Supine Supine to sit: HOB elevated;Max assist Sit to supine: Min assist General bed mobility comments: max A to sit.  Pt. restless at EOB needing to return to supine Modified Rankin (Stroke Patients Only) Pre-Morbid Rankin Score: Slight disability Modified Rankin: Moderately severe disability    Exercises     PT Diagnosis: Difficulty walking;Abnormality of gait;Generalized weakness;Hemiplegia dominant side  PT Problem List: Decreased strength;Decreased range of motion;Decreased activity tolerance;Decreased balance;Decreased mobility;Decreased coordination;Decreased cognition;Decreased knowledge of use of DME;Decreased safety awareness;Decreased knowledge of precautions;Impaired sensation PT Treatment Interventions: DME instruction;Gait training;Functional mobility training;Therapeutic activities;Therapeutic exercise;Balance training;Neuromuscular re-education;Cognitive remediation;Patient/family education;Wheelchair mobility training     PT Goals(Current goals can be found in the care plan section) Acute Rehab PT Goals Patient Stated Goal: unable to state; family wants pt. to be independent PT Goal Formulation: With patient/family Time For Goal Achievement: 04/18/13 Potential to Achieve Goals: Good  Visit Information  Last PT Received On: 04/04/13 Assistance Needed: +2 History of Present Illness: Patient is 80 year old male with past medical history significant for multiple medical problems i.e. coronary artery disease history of  non-Q-wave myocardial infarction the past ischemic cardiomyopathy, history of congestive heart failure secondary to systolic dysfunction, history of complete heart block status post permanent pacemaker in the past, hypertension, non-insulin-dependent diabetes matters, severe peripheral vascular disease with gangrene of the  foot status post bilateral below knee amputation in the past, hypercholesteremia, history of CVA in the past, and had PTA to middle cerebral artery the past, came to the ER by EMS as patient was noted to have difficulty moving and slurred speech noted by daughter and left-sided weakness. Patient denies any seizure activities. Denies any headaches in ED his motor strength is improved but continues to have slurred speech. Denies any blurring of vision but states he feels numb all over. Patient denies any palpitation lightheadedness or syncope. Denies any chest pain denies shortness of breath       Prior Functioning  Home Living Family/patient expects to be discharged to:: Private residence Living Arrangements: Children Available Help at Discharge: Family;Available 24 hours/day Type of Home: House Home Layout: One level Home Equipment: Walker - 2 wheels;Wheelchair - manual Prior Function Level of Independence: Independent with assistive device(s) Comments: used RW and bil. prostheses for amb Communication Communication: Expressive difficulties Dominant Hand: Right    Cognition  Cognition Arousal/Alertness: Lethargic Behavior During Therapy: Restless Overall Cognitive Status: Difficult to assess Difficult to assess due to: Impaired communication;Level of arousal    Extremity/Trunk Assessment Upper Extremity Assessment Upper Extremity Assessment: Defer to OT evaluation (see notes above) Lower Extremity Assessment Lower Extremity Assessment: RLE deficits/detail;LLE deficits/detail RLE Deficits / Details: BKA; hip knee strength and ROM WFL RLE Sensation: decreased light  touch LLE Deficits / Details: BKA; hip/knee strength WFL LLE Sensation:  (WNL)   Balance Balance Overall balance assessment: Needs assistance Sitting-balance support: Feet unsupported;Single extremity supported;No upper extremity supported Sitting balance-Leahy Scale: Zero Sitting balance - Comments: Pt. unable to sit unsupported.  Unable to hold rail with RUE and reaching for UE support with LUE.  No significant LOB any direction pt just unable to sit unsupported General Comments General comments (skin integrity, edema, etc.): **Pt. with significant change in UE strength compared to previous MD note.  Pt with limited movement and attention to RUE and significant expressive difficulties noted.  RN notified and aware of changes.**  End of Session PT - End of Session Activity Tolerance: Patient limited by fatigue;Patient tolerated treatment well Patient left: in bed;with call bell/phone within reach;with family/visitor present Nurse Communication: Mobility status  GP     Moshe Cipro K 04/04/2013, 11:04 AM  Clarita Crane, PT, DPT [phone]

## 2013-04-04 NOTE — Evaluation (Signed)
Speech Language Pathology Evaluation Patient Details Name: [name] MRN: [id number] DOB: 03/27/1931 Today's Date: 04/04/2013 Time: [id number] SLP Time Calculation (min): 65 min  Problem List:  Patient Active Problem List   Diagnosis Date Noted  . Left-sided weakness 04/03/2013  . Stroke 04/03/2013  . CVA (cerebral infarction) 04/03/2013  . Pacemaker-Medtronic 10/02/2011  . Atherosclerosis of native arteries of the extremities with ulceration 07/18/2011  . Critical lower limb ischemia 06/14/2011  . DM 12/22/2009  . HYPERCHOLESTEROLEMIA 12/22/2009  . HYPERTENSION 12/22/2009  . Complete heart block 12/22/2009   Past Medical History:  Past Medical History  Diagnosis Date  . Hypercholesteremia     takes Lipitor daily  . CAD (coronary artery disease)     s/p PTCA to PLV branch in 2002  . Non-insulin dependent diabetes mellitus   . PVD (peripheral vascular disease)     s/p right BKA  . CVA (cerebral infarction)     s/p PTA to middle cerebral artery;slurred speech  . HTN (hypertension)     takes Benazepril daily  . Pacemaker   . Shortness of breath     sitting as well as standing  . Glaucoma   . Complete heart block     s/p Medtronic PPM by JA 09/2009  . Stroke    Past Surgical History:  Past Surgical History  Procedure Laterality Date  . Pacemaker insertion  09/2010  . Appendectomy    . Amputation      below amputation  . Cardiac catheterization      2002/2009  . Amputation  08/02/2011    Procedure: AMPUTATION BELOW KNEE;  Surgeon: Larina Earthlyodd F Early, MD;  [facility];  Service: Vascular;  Laterality: Left;  . Insert / replace / remove pacemaker     HPI:  Patient is an 80 y.o. male admitted to hospital secondary to difficulty moving, slurred speech, and left-sided weakness. PMH: CAD, MI, ischemic cardiomyopathy, CHF, complete heart block, s/p permanent pacemaker placement, HTN, non-insulin dependent DM, PVD, s/p BKA, h/o CVA and PTA to middle cerebral artery.    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 Language abilities were difficult to assess secondary to severe articulation deficit. Patient was able to express self in complete sentences, ("sometimes the words come out good, but sometimes I stumble")but at times appeared to exhibit disorganized, tangential language. Patient's speech, cognitive, and language deficits severely impact his ability to communicate even basic needs/wants, and impact his safety.     SLP Assessment  Patient needs continued Speech Lanaguage Pathology Services    Follow Up Recommendations  24 hour supervision/assistance;Inpatient Rehab    Frequency and Duration min 2x/week  2 weeks   Pertinent Vitals/Pain    SLP Goals  SLP Goals Potential to Achieve Goals: Good Potential Considerations: Previous level of function  SLP Evaluation Prior Functioning  Cognitive/Linguistic Baseline: Information not available Type of Home: House Available Help at Discharge: Family;Available 24 hours/day   Cognition  Overall Cognitive Status: No family/caregiver present to determine baseline cognitive functioning Arousal/Alertness: Awake/alert Orientation Level: Oriented to person;Oriented to time;Disoriented to situation;Disoriented to place Attention: Sustained Sustained Attention: Impaired Sustained Attention Impairment: Verbal complex Executive Function: Self Monitoring Self Monitoring: Impaired Self Monitoring Impairment: Verbal basic Behaviors: Impulsive    Comprehension  Auditory Comprehension Overall Auditory Comprehension: Impaired Yes/No Questions: Within Functional  Limits Commands: Impaired Interfering Components: Attention;Processing speed EffectiveTechniques:  Extra processing time;Repetition Visual Recognition/Discrimination Discrimination: Not tested Reading Comprehension Reading Status: Not tested    Expression Expression Primary Mode of Expression: Verbal Verbal Expression Initiation: Impaired Level of Generative/Spontaneous Verbalization: Phrase;Sentence Interfering Components: Attention;Speech intelligibility Effective Techniques: Semantic cues;Open ended questions Written Expression Dominant Hand: Right Written Expression: Not tested   Oral / Motor Oral Motor/Sensory Function Overall Oral Motor/Sensory Function: Impaired Labial ROM: Within Functional Limits Labial Symmetry: Within Functional Limits Labial Strength: Within Functional Limits Lingual ROM: Reduced left;Reduced right Lingual Symmetry: Within Functional Limits Lingual Strength: Within Functional Limits Facial ROM: Within Functional Limits Facial Symmetry: Within Functional Limits Facial Strength: Within Functional Limits Motor Speech Respiration: Impaired Phonation: Normal Resonance: Within functional limits Articulation: Impaired Level of Impairment: Word Intelligibility: Intelligibility reduced Word: 50-74% accurate Phrase: 0-24% accurate Sentence: 0-24% accurate Conversation: 0-24% accurate Motor Planning: Impaired Level of Impairment: Phrase Motor Speech Errors: Unaware;Inconsistent Effective Techniques: Slow rate;Increased vocal intensity   GO     [name] 04/04/2013, 9:08 PM  Angela Nevin, MA, CCC-SLP [facility] Speech-Language Pathologist

## 2013-04-04 NOTE — Progress Notes (Addendum)
Stroke Team Progress Note  HISTORY [name] is an 80 y.o. male presenting 04/03/2013 with symptoms of difficulty moving. He stated that it was "all over" and requested to be brought to the emergency room out of concern for new stroke. After arrival he had some improvement, however he had persistent new slurred speech as well as some mild left-sided weakness which he stated was new .   LKW: 9:30 pm 3/18  tpa given?: no, outside of window    SUBJECTIVE Multiple family members present. The patient seemed worse this morning. They report that the patient had memory problems prior to admission.  OBJECTIVE Most recent Vital Signs: Filed Vitals:   04/04/13 0202 04/04/13 0413 04/04/13 0618 04/04/13 0640  BP: 183/87 186/88 198/101 184/86  Pulse: 51 67 74 68  Temp: 98.1 F (36.7 C) 98.2 F (36.8 C) 97.7 F (36.5 C)   TempSrc: Oral Oral Oral   Resp: 18 18 16    Height:      Weight:      SpO2: 96% 95% 98%    CBG (last 3)   Recent Labs  04/03/13 2152 04/04/13 0718  GLUCAP 104* 85    IV Fluid Intake:   . sodium chloride 20 mL/hr at 04/03/13 2248    MEDICATIONS  . aspirin EC  81 mg Oral Daily  . atorvastatin  80 mg Oral Daily  . carvedilol  12.5 mg Oral BID WC  . clopidogrel  75 mg Oral Daily  . heparin  5,000 Units Subcutaneous 3 times per day  . influenza vac split quadrivalent PF  0.5 mL Intramuscular Tomorrow-1000  . insulin aspart  0-9 Units Subcutaneous TID WC  . isosorbide mononitrate  60 mg Oral Daily  . pantoprazole  40 mg Oral Daily  . pneumococcal 23 valent vaccine  0.5 mL Intramuscular Tomorrow-1000  . ramipril  5 mg Oral Daily   PRN:  nitroGLYCERIN, senna-docusate  Diet:  Cardiac dysphagia 2 with thin  liquids Activity:  Bedrest DVT Prophylaxis:  Subcutaneous heparin  CLINICALLY SIGNIFICANT STUDIES Basic Metabolic Panel:   Recent Labs Lab 04/03/13 0920 04/03/13 2145  NA 144  --   K 5.3  --   CL 103  --   CO2 27  --   GLUCOSE 134*  --   BUN 16  --    CREATININE 0.96 0.95  CALCIUM 10.1  --    Liver Function Tests:   Recent Labs Lab 04/03/13 0920  AST 23  ALT 15  ALKPHOS 116  BILITOT 0.3  PROT 7.1  ALBUMIN 3.3*   CBC:   Recent Labs Lab 04/03/13 0920 04/03/13 2145  WBC 6.3 8.6  NEUTROABS 4.7  --   HGB 11.1* 11.6*  HCT 34.1* 34.7*  MCV 86.5 85.3  PLT 240 235   Coagulation:   Recent Labs Lab 04/03/13 0920  LABPROT 13.1  INR 1.01   Cardiac Enzymes: No results found for this basename: CKTOTAL, CKMB, CKMBINDEX, TROPONINI,  in the last 168 hours Urinalysis:   Recent Labs Lab 04/03/13 0936  COLORURINE YELLOW  LABSPEC 1.007  PHURINE 6.0  GLUCOSEU NEGATIVE  HGBUR NEGATIVE  BILIRUBINUR NEGATIVE  KETONESUR NEGATIVE  PROTEINUR 30*  UROBILINOGEN 0.2  NITRITE NEGATIVE  LEUKOCYTESUR NEGATIVE   Lipid Panel    Component Value Date/Time   CHOL 125 04/04/2013 0208   TRIG 49 04/04/2013 0208   HDL 52 04/04/2013 0208   CHOLHDL 2.4 04/04/2013 0208   VLDL 10 04/04/2013 0208   LDLCALC 63 04/04/2013 0208  HgbA1C  Lab Results  Component Value Date   HGBA1C 6.8* 04/03/2013    Urine Drug Screen:     Component Value Date/Time   LABOPIA NONE DETECTED 04/03/2013 0936   COCAINSCRNUR NONE DETECTED 04/03/2013 0936   LABBENZ NONE DETECTED 04/03/2013 0936   AMPHETMU NONE DETECTED 04/03/2013 0936   THCU NONE DETECTED 04/03/2013 0936   LABBARB NONE DETECTED 04/03/2013 0936    Alcohol Level:   Recent Labs Lab 04/03/13 0920  ETH <11    Dg Chest 2 View 04/03/2013    Cardiomegaly without acute disease.      Ct Head Wo Contrast 04/03/2013    Atrophy with mild small vessel disease. Prior small lacunar infarcts in the mid left cerebellum in superior aspect of the head of the caudate nucleus on the right. There is no intracranial mass, hemorrhage, or acute appearing infarct.     MRI of the brain  Permanent pacemaker  2D Echocardiogram  pending  Carotid Doppler  pending    EKG  Paced rhythm 72 beats per minute. For complete  results please see formal report.   Therapy Recommendations pending  Physical Exam   Frail elderly african [place] male not in distress.Awake alert. Afebrile. Head is nontraumatic. Neck is supple without bruit. Hearing is normal. Cardiac exam no murmur or gallop. Lungs are clear to auscultation. Distal pulses are well felt. He has bilateral above knee amputation. Neurological Exam : Awake alert oriented x2. Severe dysarthria but can be understood. Extraocular moments are full range without nystagmus. Blinks to threat bilaterally. Mild left lower facial weakness. Tongue midline. Weak cough and gag. Moderate left hemiparesis with 3/5 upper and lower extremity strength. Bilateral above knee amputation. Sensation diminished on the left compared to the right. Gait cannot be tested. ASSESSMENT [name] is a 80 y.o. male presenting with dysarthria and left hemiparesis. TPA was not given secondary to late presentation.. A CT scan showed no acute infarct. The patient cannot have an MRI secondary to pacemaker. On aspirin 81 mg orally every day and clopidogrel 75 mg orally every day prior to admission. Now on clopidogrel 75 mg orally every day and aspirin suppository 150 mg for secondary stroke prevention. Patient with resultant dysarthria and left hemiparesis. Stroke work up underway.   Hyperlipidemia - up prior to admission  Permanent pacemaker  Coronary artery disease  Previous stroke  Hypertension history  Hemoglobin A1c 6.8  Hospital day # 1  TREATMENT/PLAN  Continue aspirin 81 mg orally every day and clopidogrel 75 mg orally every day for secondary stroke prevention.  Check CT angiogram since patient is unable to have MRI due to to permanent pacemaker.  Await 2-D echo carotid Dopplers  Await therapy evaluations  D/W son and answered questions   Hassel NethDavid Rinehuls PA-C [facility] Pager [phone] 04/04/2013, 8:04 AM  I have personally obtained a history,  examined the patient, evaluated imaging results, and formulated the assessment and plan of care. I agree with the above. Delia HeadyPramod Sethi, MD   To contact Stroke Continuity provider, please refer to [email or web address]. After hours, contact General Neurology

## 2013-04-04 NOTE — Evaluation (Signed)
Occupational Therapy Evaluation Patient Details Name: [name] MRN: [id number] DOB: 03/27/1931 Today's Date: 04/04/2013 Time: [id number] OT Time Calculation (min): 16 min  OT Assessment / Plan / Recommendation History of present illness Patient is 80 year old male with past medical history significant for multiple medical problems i.e. 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 Patient denies any seizure activities. Denies any headaches in ED his motor strength is improved but continues to have slurred speech. Denies any blurring of vision but states he feels numb all over. Patient denies any palpitation lightheadedness or syncope. Denies any chest pain denies shortness of breath   Clinical Impression   Pt presents with some mild UE weakness, new R UE weakness, apraxia, and sensory impairment since admission.  Pt keeps his eyes closed primarily. Unable to track to R side, requires external stabilization of head to separate eye from head movement.  Sitting balance is poor.  Pt is dependent in all ADL.  Prior to admission, pt performed mobility and ADL independently with his B LE prosthesis assistive devices. Will follow acutely.    OT Assessment  Patient needs continued OT Services    Follow Up Recommendations  CIR;Supervision/Assistance - 24 hour    Barriers to Discharge      Equipment  Recommendations       Recommendations for Other Services Rehab consult  Frequency  Min 2X/week    Precautions / Restrictions Precautions Precautions: Fall   Pertinent Vitals/Pain No pain reported, VSS    ADL  Eating/Feeding: NPO Where Assessed - Eating/Feeding: Bed level Grooming: Wash/dry face;Maximal assistance Where Assessed - Grooming: Supine, head of bed up Upper Body Bathing: +1 Total assistance Where Assessed - Upper Body Bathing: Supine, head of bed up;Rolling right and/or left Lower Body Bathing: +1 Total assistance Where Assessed - Lower Body Bathing: Supine, head of bed up;Rolling right and/or left Upper Body Dressing: Maximal assistance Where Assessed - Upper Body Dressing: Supine, head of bed up Lower Body Dressing: +1 Total assistance Where Assessed - Lower Body Dressing: Supine, head of bed up;Rolling right and/or left    OT Diagnosis: Generalized weakness;Cognitive deficits;Disturbance of vision  OT Problem List: Decreased strength;Decreased activity tolerance;Impaired balance (sitting and/or standing);Impaired vision/perception;Decreased coordination;Decreased cognition;Decreased safety awareness;Impaired sensation;Impaired UE functional use OT Treatment Interventions: Self-care/ADL training;Neuromuscular education;DME and/or AE instruction;Therapeutic activities;Cognitive remediation/compensation;Visual/perceptual remediation/compensation;Patient/family education;Balance training   OT Goals(Current goals can be found in the care plan section) Acute Rehab OT Goals Patient Stated Goal: unable to state; family wants pt. to be independent OT Goal Formulation: Patient unable to participate in goal setting Time For Goal Achievement: 04/18/13 Potential to Achieve Goals: Good ADL Goals Pt Will Perform Eating: with min assist;sitting (with L hand) Pt Will Perform Grooming: with min assist;sitting Additional ADL Goal #1: Pt will sit EOB unsupported with min assist  x 5  minutes as a precursor to ADL. Additional ADL Goal #2: Pt will use R UE to stabilize an object as the L manipulates with min assist. Additional ADL Goal #3:  Visual testing will be completed and appropriate goals set accordingly.  Visit Information  Last OT Received On: 04/04/13 Assistance Needed: +2 History of Present Illness: Patient is 80 year old male with past medical history significant for multiple medical problems i.e. 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 Patient denies any seizure activities. Denies any headaches in ED his motor strength is improved but continues to have slurred speech. Denies any blurring of vision but states he feels numb all over. Patient denies any palpitation lightheadedness or syncope. Denies any chest pain denies shortness of breath       Prior Functioning     Home Living Family/patient expects to be discharged to:: Private residence Living Arrangements: Children Available Help at Discharge: Family;Available 24 hours/day Type of Home: House Home Layout: One level Home Equipment: Walker - 2 wheels;Wheelchair - manual Prior Function Level of Independence: Independent with assistive device(s) Comments: used RW and bil. prostheses for amb Communication Communication: Expressive difficulties Dominant Hand: Right         Vision/Perception Vision -  History Patient Visual Report: Other (comment) (pt unable to report, but keeps eyes closed frequently) Vision - Assessment Vision Assessment: Vision impaired - to be further tested in functional context Additional Comments: unable to elicit tracking to R without head turn, pt with difficulty keeping eyes open adequately for full evaluation Praxis Praxis: Impaired Praxis Impairment Details: Ideomotor   Cognition  Cognition Arousal/Alertness: Lethargic Behavior During Therapy: Restless Overall Cognitive Status: Difficult to assess Area of Impairment: Memory;Orientation Orientation Level: Disoriented to;Time Memory: Decreased short-term memory General Comments: asked same question repeatedly and where his wife went Difficult to assess due to: Impaired communication    Extremity/Trunk Assessment Upper Extremity Assessment Upper Extremity Assessment: RUE deficits/detail;LUE deficits/detail RUE Deficits / Details: impaired coordination, with assist to initiate movement, grossly 2/5 shoulder, 3-/5 elbow, inconsistently able to perform gross grasp of R hand. RUE Sensation: decreased light touch;decreased proprioception (impaired temperature) RUE Coordination: decreased fine motor;decreased gross motor LUE Deficits / Details: 3+/5 shoulder, 4/5 elbow flexion, 4-/5 elbow extension, 4+/5 gross grasp LUE Coordination: decreased gross motor Lower Extremity Assessment Lower Extremity Assessment: Defer to PT evaluation RLE Deficits / Details: BKA; hip knee strength and ROM WFL RLE Sensation: decreased light touch LLE Deficits / Details: BKA; hip/knee strength WFL     Mobility Bed Mobility Overal bed mobility: Needs Assistance Bed Mobility: Supine to Sit;Sit to Supine Supine to sit: HOB elevated;Max assist Sit to supine: Min assist General bed mobility comments: max assist in bed and at edge     Exercise     Balance Balance Overall balance assessment: Needs assistance Sitting  balance-Leahy Scale: Zero Sitting balance - Comments: unable to sit without max support or use UEs effectively to assist balance   End of Session OT - End of Session Activity Tolerance: Patient limited by lethargy Patient left: in bed;with call bell/phone within reach;with family/visitor present (ST in room)  GO     Evern Bio 04/04/2013, 2:18 PM [phone]

## 2013-04-04 NOTE — Evaluation (Signed)
Clinical/Bedside Swallow Evaluation Patient Details  Name: [name] MRN: [id number] Date of Birth: 1931-08-04  Today's Date: 04/04/2013 Time: 9562-1308 SLP Time Calculation (min): 65 min  Past Medical History:  Past Medical History  Diagnosis Date  . Hypercholesteremia     takes Lipitor daily  . CAD (coronary artery disease)     s/p PTCA to PLV branch in 2002  . Non-insulin dependent diabetes mellitus   . PVD (peripheral vascular disease)     s/p right BKA  . CVA (cerebral infarction)     s/p PTA to middle cerebral artery;slurred speech  . HTN (hypertension)     takes Benazepril daily  . Pacemaker   . Shortness of breath     sitting as well as standing  . Glaucoma   . Complete heart block     s/p Medtronic PPM by JA 09/2009  . Stroke    Past Surgical History:  Past Surgical History  Procedure Laterality Date  . Pacemaker insertion  09/2010  . Appendectomy    . Amputation      below amputation  . Cardiac catheterization      2002/2009  . Amputation  08/02/2011    Procedure: AMPUTATION BELOW KNEE;  Surgeon: Larina Earthly, MD;  Location: [facility] OR;  Service: Vascular;  Laterality: Left;  . Insert / replace / remove pacemaker     HPI:      Assessment / Plan / Recommendation Clinical Impression  Patient presents with a moderate oral, and a mild-moderate pharyngeal dysphagia characterized by prolonged bolus formation and suspected delayed swallow initiation. Patient presents with oral-motor apraxia and discoordination of lingual ROM, which appears to impact his overall swallow function. One throat clear response noted when patient consuming large bolus of thin liquids via straw sip, but when clinician provided assistance for controlled, smaller sips of thin liquids, no overt s/s aspiration noted. Patient is impulsive and attempts to take large volume sips of liquids.     Aspiration Risk  Mild    Diet Recommendation Dysphagia 2 (Fine chop);Thin liquid   Liquid  Administration via: No straw;Cup Medication Administration: Whole meds with puree (crush if large pills) Supervision: Full supervision/cueing for compensatory strategies Compensations: Slow rate;Small sips/bites Postural Changes and/or Swallow Maneuvers: Seated upright 90 degrees    Other  Recommendations Oral Care Recommendations: Oral care BID   Follow Up Recommendations  24 hour supervision/assistance;Inpatient Rehab    Frequency and Duration min 2x/week  2 weeks   Pertinent Vitals/Pain     SLP Swallow Goals  1. Patient will consume Dys 2, Thin liquids diet with no overt s/s aspiration.  2. Patient will participate in trials of upgraded solid textures with no overt s/s aspiration.   Swallow Study Prior Functional Status       General Date of Onset: 04/03/13 Type of Study: Bedside swallow evaluation Diet Prior to this Study: NPO Temperature Spikes Noted: No Respiratory Status: Room air History of Recent Intubation: No Behavior/Cognition: Alert;Cooperative;Confused;Requires cueing;Distractible Oral Cavity - Dentition: Edentulous Self-Feeding Abilities: Needs assist Patient Positioning: Upright in bed Baseline Vocal Quality: Clear Volitional Cough: Strong Volitional Swallow: Unable to elicit    Oral/Motor/Sensory Function Overall Oral Motor/Sensory Function: Impaired Labial ROM: Within Functional Limits Labial Symmetry: Within Functional Limits Labial Strength: Within Functional Limits Lingual ROM: Reduced left;Reduced right (appears with apraxia, with discoordinated ROM) Lingual Symmetry: Within Functional Limits Lingual Strength: Within Functional Limits Facial ROM: Within Functional Limits Facial Symmetry: Within Functional Limits Facial Strength: Within Functional Limits  Ice Chips Ice chips: Not tested   Thin Liquid Thin Liquid: Impaired Presentation: Straw;Cup Pharyngeal  Phase Impairments: Suspected delayed Swallow;Throat Clearing - Delayed Other Comments:  delayed throat clearing noted when patient taking sips of thin liquids from straw. No throat clearing or cough noted with patient directed to take smaller, more controlled cup sips.    Nectar Thick Nectar Thick Liquid: Not tested   Honey Thick Honey Thick Liquid: Not tested   Puree Puree: Impaired Oral Phase Impairments: Reduced lingual movement/coordination Pharyngeal Phase Impairments: Suspected delayed Swallow   Solid   GO    Solid: Impaired Oral Phase Impairments: Reduced lingual movement/coordination;Impaired mastication Pharyngeal Phase Impairments: Suspected delayed [name] JuneSwallow       [name] [name] 04/04/2013,6:10 PM  [name] NevinJohn T. Marysa Wessner, MA, CCC-SLP [facility] Speech-Language Pathologist

## 2013-04-04 NOTE — Progress Notes (Signed)
Echocardiogram 2D Echocardiogram has been performed.  Dorothey BasemanReel, Eleonor Ocon M 04/04/2013, 10:51 AM

## 2013-04-04 NOTE — Progress Notes (Signed)
Rehab Admissions Coordinator Note:  Patient was screened by Clois DupesBoyette, Cleofas Hudgins Godwin for appropriateness for an Inpatient Acute Rehab Consult per PT and OT recommendations.   At this time, we are recommending Inpatient Rehab consult. Please order if you feel appropriate.  Clois DupesBoyette, Natalie Leclaire Godwin 04/04/2013, 3:15 PM  I can be reached at [phone].

## 2013-04-04 NOTE — Clinical Social Work Note (Signed)
CSW received consult for advance directives. Per RN and chart review, pt is NOT alert and oriented x4 and will be unable to participate in advance directive.   *Pt must be alert and oriented x4 in order to participate in [facility]. Pt's family will have to seek legal guidance to complete advance directives*  CSW notified RN of this information. Per RN, Ms. Berneta Sagesisdale (pt's daughter) is pt's primary caregiver. CSW attempted to speak with Ms. Tisdale, and left Ms. Tisdale a voice message requesting a call back.    Darlyn ChamberEmily [place], LCSWA Clinical Social Worker [phone]

## 2013-04-04 NOTE — Progress Notes (Signed)
*  PRELIMINARY RESULTS* Vascular Ultrasound Carotid Duplex (Doppler) has been completed.   Findings suggest 1-39% right internal carotid artery stenosis and 80-99% left internal carotid artery stenosis by velocity. Vertebral arteries are patent with antegrade flow.  04/04/2013 1:37 PM Gertie FeyMichelle Anner Baity, RVT, RDCS, RDMS

## 2013-04-04 NOTE — Progress Notes (Signed)
Subjective:  More slurred speech today and right weakness.  Objective:  Vital Signs in the last 24 hours: Temp:  [97.7 F (36.5 C)-98.8 F (37.1 C)] 98.4 F (36.9 C) (03/20 1329) Pulse Rate:  [51-85] 71 (03/20 1329) Resp:  [16-20] 16 (03/20 1329) BP: (160-198)/(74-101) 188/81 mmHg (03/20 1329) SpO2:  [95 %-100 %] 100 % (03/20 1329)  Intake/Output from previous day:   Intake/Output from this shift: Total I/O In: 120 [P.O.:120] Out: 675 [Urine:675]  Physical Exam: Neck: no adenopathy, no carotid bruit, no JVD and supple, symmetrical, trachea midline Lungs: clear to auscultation bilaterally Heart: regular rate and rhythm, S1, S2 normal and soft systolic murmur noted Abdomen: soft, non-tender; bowel sounds normal; no masses,  no organomegaly Extremities: no clubbing cyanosis bilateral below-knee amputation noted Neuro awake motor strength 4 over 5 in right upper extremity and 5 over 5 in left extremities Lab Results:  Recent Labs  04/03/13 0920 04/03/13 2145  WBC 6.3 8.6  HGB 11.1* 11.6*  PLT 240 235    Recent Labs  04/03/13 0920 04/03/13 2145  NA 144  --   K 5.3  --   CL 103  --   CO2 27  --   GLUCOSE 134*  --   BUN 16  --   CREATININE 0.96 0.95   No results found for this basename: TROPONINI, CK, MB,  in the last 72 hours Hepatic Function Panel  Recent Labs  04/03/13 0920  PROT 7.1  ALBUMIN 3.3*  AST 23  ALT 15  ALKPHOS 116  BILITOT 0.3    Recent Labs  04/04/13 0208  CHOL 125   No results found for this basename: PROTIME,  in the last 72 hours  Imaging: Imaging results have been reviewed and Dg Chest 2 View  04/03/2013   CLINICAL DATA:  Stroke.  EXAM: CHEST  2 VIEW  COMPARISON:  PA and lateral chest 07/19/2011.  FINDINGS: There is cardiomegaly and vascular congestion without frank edema No consolidative process, pneumothorax or effusion. Pacing device noted.  IMPRESSION: Cardiomegaly without acute disease.   Electronically Signed   By: Drusilla Kanner M.D.   On: 04/03/2013 22:30   Ct Head Wo Contrast  04/03/2013   CLINICAL DATA:  Slurred speech and altered mental status  EXAM: CT HEAD WITHOUT CONTRAST  TECHNIQUE: Contiguous axial images were obtained from the base of the skull through the vertex without intravenous contrast. Study was obtained within 24 hr of patient's arrival at the emergency department.  COMPARISON:  April 14, 2012  FINDINGS: Moderate diffuse atrophy is stable. There is slightly greater atrophy in the cerebellum than elsewhere. A stable connection of the inferior fourth ventricle with the cisterna magna, a congenital Dandy-Walker type variant which is stable.  There is no mass, hemorrhage, extra-axial fluid collection, or midline shift. There is mild small vessel disease in the centra semiovale bilaterally. There is a prior small lacunar infarct in the periphery of the left mid cerebellum. There is a stable prior lacunar infarct in the superior aspect of the head of the caudate nucleus on right. There is no acute appearing infarct.  Bony calvarium appears intact.  Mastoid air cells are clear.  IMPRESSION: Atrophy with mild small vessel disease. Prior small lacunar infarcts in the mid left cerebellum in superior aspect of the head of the caudate nucleus on the right. There is no intracranial mass, hemorrhage, or acute appearing infarct.   Electronically Signed   By: Bretta Bang M.D.   On:  04/03/2013 10:46    Cardiac Studies:  Assessment/Plan:  Rule out recurrent CVA  History of CVA in the past status post PTA to major cerebral artery in the past  Coronary artery disease history of MI in the past  Ischemic cardiomyopathy  Compensated systolic heart failure in  History of complete heart block status post permanent pacemaker  Hypertension  Non-insulin-dependent diabetes mellitus  Severe peripheral vascular disease status post bilateral below-knee amputation in the past  Hypercholesteremia Plan Schedule for repeat  CT of the brain today Discussed with patient's family regarding CODE STATUS and they will discuss with other family members and decide  LOS: 1 day    [name] N 04/04/2013, 2:33 PM

## 2013-04-05 LAB — GLUCOSE, CAPILLARY
GLUCOSE-CAPILLARY: 109 mg/dL — AB (ref 70–99)
GLUCOSE-CAPILLARY: 176 mg/dL — AB (ref 70–99)
Glucose-Capillary: 103 mg/dL — ABNORMAL HIGH (ref 70–99)
Glucose-Capillary: 173 mg/dL — ABNORMAL HIGH (ref 70–99)
Glucose-Capillary: 143 mg/dL — ABNORMAL HIGH (ref 70–99)

## 2013-04-05 MED ORDER — ASPIRIN 81 MG PO CHEW
81.0000 mg | CHEWABLE_TABLET | Freq: Every day | ORAL | Status: DC
  Administered 2013-04-05 – 2013-04-10 (×6): 81 mg via ORAL
  Filled 2013-04-05 (×7): qty 1

## 2013-04-05 NOTE — Progress Notes (Signed)
Subjective:  Awake follows command but significant speech difficulty. Right upper extremity weakness persist. Results of CT angiography noted. Patient had percutaneous transluminal angioplasty to left mid cerebral artery in 2009 by interventional radiology. Will consult IR for further input.  Objective:  Vital Signs in the last 24 hours: Temp:  [97.7 F (36.5 C)-99.1 F (37.3 C)] 98.5 F (36.9 C) (03/21 0925) Pulse Rate:  [66-84] 84 (03/21 0925) Resp:  [16-20] 20 (03/21 0925) BP: (114-191)/(56-95) 159/74 mmHg (03/21 0925) SpO2:  [95 %-100 %] 98 % (03/21 0925)  Intake/Output from previous day: 03/20 0701 - 03/21 0700 In: 120 [P.O.:120] Out: 975 [Urine:975] Intake/Output from this shift:    Physical Exam: Neck: no adenopathy, no carotid bruit, no JVD and supple, symmetrical, trachea midline Lungs: clear to auscultation bilaterally Heart: regular rate and rhythm, S1, S2 normal and Soft systolic murmur noted Abdomen: soft, non-tender; bowel sounds normal; no masses,  no organomegaly Extremities: Bilateral BKA  Lab Results:  Recent Labs  04/03/13 0920 04/03/13 2145  WBC 6.3 8.6  HGB 11.1* 11.6*  PLT 240 235    Recent Labs  04/03/13 0920 04/03/13 2145  NA 144  --   K 5.3  --   CL 103  --   CO2 27  --   GLUCOSE 134*  --   BUN 16  --   CREATININE 0.96 0.95   No results found for this basename: TROPONINI, CK, MB,  in the last 72 hours Hepatic Function Panel  Recent Labs  04/03/13 0920  PROT 7.1  ALBUMIN 3.3*  AST 23  ALT 15  ALKPHOS 116  BILITOT 0.3    Recent Labs  04/04/13 0208  CHOL 125   No results found for this basename: PROTIME,  in the last 72 hours  Imaging: Imaging results have been reviewed and Ct Angio Head W/cm &/or Wo Cm  04/04/2013   CLINICAL DATA:  Slurred speech.  Left-sided weakness.  EXAM: CT ANGIOGRAPHY HEAD  TECHNIQUE: Multidetector CT imaging of the head was performed using the standard protocol during bolus administration of  intravenous contrast. Multiplanar CT image reconstructions and MIPs were obtained to evaluate the vascular anatomy.  CONTRAST:  50mL OMNIPAQUE IOHEXOL 350 MG/ML SOLN  COMPARISON:  Head CT 04/03/2013 and multiple previous. Angiography 10/16/2007.  FINDINGS: Again seen are chronic appearing small vessel ischemic changes throughout the white matter an old infarction in the right caudate in the left pons. There is no visible acute infarction, mass lesion, hemorrhage, hydrocephalus or extra-axial collection.  Right internal carotid artery: Atherosclerotic narrowing as the internal carotid artery passes through the base of the brain. Diameter in that segment is as small as 2.5 mm. This indicates a 50% stenosis. There is atherosclerotic calcification and narrowing within the carotid siphon. The proximal siphon, diameter is as small as 1.5 mm. The supra clinoid ICA is stenotic as well with diameter as small as 1.5 mm. The right anterior and middle cerebral vessels are patent but show atherosclerotic irregularity. There is stenosis at the right anterior cerebral origin estimated at 70%. I do not see a correctable stenosis in the right MCA. No large missing branch vessels.  Left internal carotid artery: The vessel is widely patent through the skullbase. There is atherosclerotic disease in the carotid siphon and at the supra clinoid ICA. Narrowing in the siphon is as severe as 1.5 mm at the anterior aspect. The supra clinoid internal carotid artery is stenotic with a diameter of 1.5 mm. The left anterior cerebral  artery is widely patent proximally. The M1 segment appears widely patent. There is severe stenosis of the posterior division of the MCA at the bifurcation. Vessel is less than a mm and the patient would be at risk of major left MCA distribution infarction.  Right vertebral artery is widely patent to the basilar. Right PICA is patent. Left vertebral artery is widely patent to the basilar. Left PICA is patent. Basilar  artery shows atherosclerotic irregularity without a focal flow-limiting stenosis. The superior cerebellar and posterior cerebral arteries are patent with pronounced atherosclerotic irregularity and narrowing in both PCA branches.  Review of the MIP images confirms the above findings.  IMPRESSION: No identifiable acute infarction. No identifiable acute embolus or embolic occlusion.  Severe atherosclerotic disease of the right ICA as it passes through the skullbase and of both carotid siphon regions and supra clinoid internal carotid arteries. Serial stenoses in those regions of 70% or greater. 70% stenosis at the origin of the right anterior cerebral artery. Severe stenosis (80% or greater) of the main posterior branch of the left MCA just beyond the MCA bifurcation. This would place the patient at risk of left MCA territory infarction.  Pronounced atherosclerotic narrowing and irregularity of the PCA branches bilaterally.   Electronically Signed   By: Paulina FusiMark  Shogry M.D.   On: 04/04/2013 18:55   Dg Chest 2 View  04/03/2013   CLINICAL DATA:  Stroke.  EXAM: CHEST  2 VIEW  COMPARISON:  PA and lateral chest 07/19/2011.  FINDINGS: There is cardiomegaly and vascular congestion without frank edema No consolidative process, pneumothorax or effusion. Pacing device noted.  IMPRESSION: Cardiomegaly without acute disease.   Electronically Signed   By: Drusilla Kannerhomas  Dalessio M.D.   On: 04/03/2013 22:30    Cardiac Studies:  Assessment/Plan:   Recurrent CVA   Severe cerebrovascular disease History of CVA in the past status post PTA to left mid cerebral artery in the past  Coronary artery disease history of MI in the past  Ischemic cardiomyopathy  Compensated systolic heart failure in  History of complete heart block status post permanent pacemaker  Hypertension  Non-insulin-dependent diabetes mellitus  Severe peripheral vascular disease status post bilateral below-knee amputation in the past   Hypercholesteremia Plan Continue present management Interventional radiology consult  LOS: 2 days    Aron Inge N 04/05/2013, 1:58 PM

## 2013-04-05 NOTE — Progress Notes (Signed)
Stroke Team Progress Note  HISTORY [name] is an 80 y.o. male presenting 04/03/2013 with symptoms of difficulty moving. He stated that it was "all over" and requested to be brought to the emergency room out of concern for new stroke. After arrival he had some improvement, however he had persistent new slurred speech as well as some mild left-sided weakness which he stated was new .   LKW: 9:30 pm 3/18  tpa given?: no, outside of window    SUBJECTIVE Multiple family members present. Spoke with him regarding possible rehabilitation admission for patient. Also discussed right internal carotid artery disease with possible need for vascular consult which could be done on an outpatient basis.  OBJECTIVE Most recent Vital Signs: Filed Vitals:   04/04/13 2148 04/05/13 0122 04/05/13 0533 04/05/13 0536  BP: 176/93 184/81  170/74  Pulse: 70 69 74   Temp: 98.9 F (37.2 C) 99.1 F (37.3 C) 97.7 F (36.5 C)   TempSrc: Oral Oral Oral   Resp: 16 16 16    Height:      Weight:      SpO2: 98% 100% 97%    CBG (last 3)   Recent Labs  04/04/13 2111 04/05/13 0645 04/05/13 0815  GLUCAP 142* 103* 109*    IV Fluid Intake:   . sodium chloride 20 mL/hr at 04/03/13 2248    MEDICATIONS  . aspirin  150 mg Rectal Daily  . atorvastatin  80 mg Oral Daily  . carvedilol  12.5 mg Oral BID WC  . clopidogrel  75 mg Oral Daily  . heparin  5,000 Units Subcutaneous 3 times per day  . insulin aspart  0-9 Units Subcutaneous TID WC  . nitroGLYCERIN  0.4 mg Transdermal Daily  . pantoprazole  40 mg Oral Daily  . ramipril  5 mg Oral Daily   PRN:  nitroGLYCERIN, senna-docusate  Diet:  Dysphagia 2 with thin  liquids Activity:  Bedrest DVT Prophylaxis:  Subcutaneous heparin  CLINICALLY SIGNIFICANT STUDIES Basic Metabolic Panel:   Recent Labs Lab 04/03/13 0920 04/03/13 2145  NA 144  --   K 5.3  --   CL 103  --   CO2 27  --   GLUCOSE 134*  --   BUN 16  --   CREATININE 0.96 0.95  CALCIUM 10.1   --    Liver Function Tests:   Recent Labs Lab 04/03/13 0920  AST 23  ALT 15  ALKPHOS 116  BILITOT 0.3  PROT 7.1  ALBUMIN 3.3*   CBC:   Recent Labs Lab 04/03/13 0920 04/03/13 2145  WBC 6.3 8.6  NEUTROABS 4.7  --   HGB 11.1* 11.6*  HCT 34.1* 34.7*  MCV 86.5 85.3  PLT 240 235   Coagulation:   Recent Labs Lab 04/03/13 0920  LABPROT 13.1  INR 1.01   Cardiac Enzymes: No results found for this basename: CKTOTAL, CKMB, CKMBINDEX, TROPONINI,  in the last 168 hours Urinalysis:   Recent Labs Lab 04/03/13 0936  COLORURINE YELLOW  LABSPEC 1.007  PHURINE 6.0  GLUCOSEU NEGATIVE  HGBUR NEGATIVE  BILIRUBINUR NEGATIVE  KETONESUR NEGATIVE  PROTEINUR 30*  UROBILINOGEN 0.2  NITRITE NEGATIVE  LEUKOCYTESUR NEGATIVE   Lipid Panel    Component Value Date/Time   CHOL 125 04/04/2013 0208   TRIG 49 04/04/2013 0208   HDL 52 04/04/2013 0208   CHOLHDL 2.4 04/04/2013 0208   VLDL 10 04/04/2013 0208   LDLCALC 63 04/04/2013 0208   HgbA1C  Lab Results  Component Value Date  HGBA1C 6.8* 04/04/2013    Urine Drug Screen:     Component Value Date/Time   LABOPIA NONE DETECTED 04/03/2013 0936   COCAINSCRNUR NONE DETECTED 04/03/2013 0936   LABBENZ NONE DETECTED 04/03/2013 0936   AMPHETMU NONE DETECTED 04/03/2013 0936   THCU NONE DETECTED 04/03/2013 0936   LABBARB NONE DETECTED 04/03/2013 0936    Alcohol Level:   Recent Labs Lab 04/03/13 0920  ETH <11    Dg Chest 2 View 04/03/2013    Cardiomegaly without acute disease.      Ct Head Wo Contrast 04/03/2013    Atrophy with mild small vessel disease. Prior small lacunar infarcts in the mid left cerebellum in superior aspect of the head of the caudate nucleus on the right. There is no intracranial mass, hemorrhage, or acute appearing infarct.    CT angiogram of the head 04/04/2013   No identifiable acute infarction. No identifiable acute embolus or embolic occlusion.  Severe atherosclerotic disease of the right ICA as it passes  through the skullbase and of both carotid siphon regions and supra clinoid internal carotid arteries. Serial stenoses in those regions of 70% or greater. 70% stenosis at the origin of the right anterior cerebral artery. Severe stenosis (80% or greater) of the main posterior branch of the left MCA just beyond the MCA bifurcation. This would place the patient at risk of left MCA territory infarction.  Pronounced atherosclerotic narrowing and irregularity of the PCA branches bilaterally.    MRI of the brain  Permanent pacemaker  2D Echocardiogram  Ejection fraction 25-30%. No cardiac source of emboli was identified.  Carotid Doppler  pending    EKG  Paced rhythm 72 beats per minute. For complete results please see formal report.   Therapy Recommendations inpatient rehabilitation recommended.  Physical Exam   Frail elderly african [place] male not in distress.Awake alert. Afebrile. Head is nontraumatic. Neck is supple without bruit. Hearing is normal. Cardiac exam no murmur or gallop. Lungs are clear to auscultation. Distal pulses are well felt. He has bilateral above knee amputation. Neurological Exam : Awake alert oriented x2. Severe dysarthria but can be understood. Extraocular moments are full range without nystagmus. Blinks to threat bilaterally. Mild left lower facial weakness. Tongue midline. Weak cough and gag. Moderate left hemiparesis with 3/5 upper and lower extremity strength. Bilateral above knee amputation. Sensation diminished on the left compared to the right. Gait cannot be tested.   ASSESSMENT [name] is a 80 y.o. male presenting with dysarthria and left hemiparesis. TPA was not given secondary to late presentation.. A CT scan showed no acute infarct. The patient cannot have an MRI secondary to pacemaker. On aspirin 81 mg orally every day and clopidogrel 75 mg orally every day prior to admission. Now on clopidogrel 75 mg orally every day and aspirin suppository  150 mg for secondary stroke prevention. Patient with resultant dysarthria and left hemiparesis. Stroke work up underway.   Hyperlipidemia - on Lipitor prior to admission  Permanent pacemaker  Coronary artery disease  Previous stroke  Bilateral AKAs  Hypertension history  Hemoglobin A1c 6.8  CT angiogram of the head negative for acute infarct but positive for significant atherosclerotic disease left middle cerebral artery and right internal carotid artery.  Left ventricular dysfunction - ejection fraction 25-30% by 2-D echo   Hospital day # 2  TREATMENT/PLAN  Continue aspirin 81 mg orally every day and clopidogrel 75 mg orally every day for secondary stroke prevention.  CT angiogram ( patient is unable  to have MRI due to to permanent pacemaker.) No identifiable acute infarction. Severe       stenosis (80% or greater) of the mainposterior branch of the left MCA just beyond the MCA bifurcation.  Await carotid Dopplers. Probably needs vascular surgery consult as discussed with family.  Inpatient rehabilitation recommended - M.D. Rehabilitation consult ordered  Followup Dr. Pearlean Brownie in 2 months.  Stroke team will sign off. Please call for further assistance.   Delton See PA-C [facility] Pager [phone] 04/05/2013, 9:19 AM  I have personally obtained a history, examined the patient, evaluated imaging results, and formulated the assessment and plan of care. I agree with the above.    To contact Stroke Continuity provider, please refer to [email or web address]. After hours, contact General Neurology

## 2013-04-06 LAB — GLUCOSE, CAPILLARY
GLUCOSE-CAPILLARY: 101 mg/dL — AB (ref 70–99)
GLUCOSE-CAPILLARY: 156 mg/dL — AB (ref 70–99)
GLUCOSE-CAPILLARY: 160 mg/dL — AB (ref 70–99)
Glucose-Capillary: 154 mg/dL — ABNORMAL HIGH (ref 70–99)
Glucose-Capillary: 166 mg/dL — ABNORMAL HIGH (ref 70–99)

## 2013-04-06 NOTE — Progress Notes (Signed)
Patient ID: [name], male   DOB: 04/20/1931, 80 y.o.   MRN: [id number] Request received for f/u eval of pt known to our service from prior left MCA PTA in 2009. Pt has not f/u with our service since that time. He presented this week with left sided weakness and slurred speech. Previous sx's at time of our last intervention included right sided weakness and dysarthria/word finding difficulty. He has known severe cerebrovascular/PVD and has had bilateral BKA's and pacemaker placement since our last interaction. Latest CT brain reveals no acute infarct. Carotid dopplers reveal mild right ICA and severe left ICA stenoses with patent vertebral arteries. CT angio head reveals severe distal carotid disease, right ACA stenosis of 70% and > 80% stenosis of main post branch of left MCA. There was also PCA disease noted. Pt is currently on plavix 75 mg daily and aspirin 81 mg daily. Additional PMH as below. Exam: pt awake/alert x2; answers questions but is dysarthric and still has word finding difficulty; sl left facial weakness, tongue midline; EOMI, LUE strength 4/5, right 3/5; LLE 4/5; RLE 5/5 (has bilat BKA's); chest- CTA bilat, left chest wall pacemaker; heart- RRR; abd- soft,+BS,NT   Filed Vitals:   04/05/13 2101 04/06/13 0147 04/06/13 0552 04/06/13 0918  BP: 124/72 156/72 163/73 164/70  Pulse: 60 65 66 80  Temp: 98.2 F (36.8 C) 98.9 F (37.2 C) 98.3 F (36.8 C) 98 F (36.7 C)  TempSrc: Oral Oral Oral Oral  Resp: 20 18 18 18   Height:      Weight:      SpO2: 91% 100% 97% 100%   Past Medical History  Diagnosis Date  . Hypercholesteremia     takes Lipitor daily  . CAD (coronary artery disease)     s/p PTCA to PLV branch in 2002  . Non-insulin dependent diabetes mellitus   . PVD (peripheral vascular disease)     s/p right BKA  . CVA (cerebral infarction)     s/p PTA to middle cerebral artery;slurred speech  . HTN (hypertension)     takes Benazepril daily  . Pacemaker   . Shortness of breath      sitting as well as standing  . Glaucoma   . Complete heart block     s/p Medtronic PPM by JA 09/2009  . Stroke    Past Surgical History  Procedure Laterality Date  . Pacemaker insertion  09/2010  . Appendectomy    . Amputation      below amputation  . Cardiac catheterization      2002/2009  . Amputation  08/02/2011    Procedure: AMPUTATION BELOW KNEE;  Surgeon: Larina Earthly, MD;  Location: [facility] OR;  Service: Vascular;  Laterality: Left;  . Insert / replace / remove pacemaker     Ct Angio Head W/cm &/or Wo Cm  04/04/2013   CLINICAL DATA:  Slurred speech.  Left-sided weakness.  EXAM: CT ANGIOGRAPHY HEAD  TECHNIQUE: Multidetector CT imaging of the head was performed using the standard protocol during bolus administration of intravenous contrast. Multiplanar CT image reconstructions and MIPs were obtained to evaluate the vascular anatomy.  CONTRAST:  50mL OMNIPAQUE IOHEXOL 350 MG/ML SOLN  COMPARISON:  Head CT 04/03/2013 and multiple previous. Angiography 10/16/2007.  FINDINGS: Again seen are chronic appearing small vessel ischemic changes throughout the white matter an old infarction in the right caudate in the left pons. There is no visible acute infarction, mass lesion, hemorrhage, hydrocephalus or extra-axial collection.  Right internal carotid artery:  Atherosclerotic narrowing as the internal carotid artery passes through the base of the brain. Diameter in that segment is as small as 2.5 mm. This indicates a 50% stenosis. There is atherosclerotic calcification and narrowing within the carotid siphon. The proximal siphon, diameter is as small as 1.5 mm. The supra clinoid ICA is stenotic as well with diameter as small as 1.5 mm. The right anterior and middle cerebral vessels are patent but show atherosclerotic irregularity. There is stenosis at the right anterior cerebral origin estimated at 70%. I do not see a correctable stenosis in the right MCA. No large missing branch vessels.  Left internal  carotid artery: The vessel is widely patent through the skullbase. There is atherosclerotic disease in the carotid siphon and at the supra clinoid ICA. Narrowing in the siphon is as severe as 1.5 mm at the anterior aspect. The supra clinoid internal carotid artery is stenotic with a diameter of 1.5 mm. The left anterior cerebral artery is widely patent proximally. The M1 segment appears widely patent. There is severe stenosis of the posterior division of the MCA at the bifurcation. Vessel is less than a mm and the patient would be at risk of major left MCA distribution infarction.  Right vertebral artery is widely patent to the basilar. Right PICA is patent. Left vertebral artery is widely patent to the basilar. Left PICA is patent. Basilar artery shows atherosclerotic irregularity without a focal flow-limiting stenosis. The superior cerebellar and posterior cerebral arteries are patent with pronounced atherosclerotic irregularity and narrowing in both PCA branches.  Review of the MIP images confirms the above findings.  IMPRESSION: No identifiable acute infarction. No identifiable acute embolus or embolic occlusion.  Severe atherosclerotic disease of the right ICA as it passes through the skullbase and of both carotid siphon regions and supra clinoid internal carotid arteries. Serial stenoses in those regions of 70% or greater. 70% stenosis at the origin of the right anterior cerebral artery. Severe stenosis (80% or greater) of the main posterior branch of the left MCA just beyond the MCA bifurcation. This would place the patient at risk of left MCA territory infarction.  Pronounced atherosclerotic narrowing and irregularity of the PCA branches bilaterally.   Electronically Signed   By: Paulina Fusi M.D.   On: 04/04/2013 18:55   Dg Chest 2 View  04/03/2013   CLINICAL DATA:  Stroke.  EXAM: CHEST  2 VIEW  COMPARISON:  PA and lateral chest 07/19/2011.  FINDINGS: There is cardiomegaly and vascular congestion without  frank edema No consolidative process, pneumothorax or effusion. Pacing device noted.  IMPRESSION: Cardiomegaly without acute disease.   Electronically Signed   By: Drusilla Kanner M.D.   On: 04/03/2013 22:30   Ct Head Wo Contrast  04/03/2013   CLINICAL DATA:  Slurred speech and altered mental status  EXAM: CT HEAD WITHOUT CONTRAST  TECHNIQUE: Contiguous axial images were obtained from the base of the skull through the vertex without intravenous contrast. Study was obtained within 24 hr of patient's arrival at the emergency department.  COMPARISON:  April 14, 2012  FINDINGS: Moderate diffuse atrophy is stable. There is slightly greater atrophy in the cerebellum than elsewhere. A stable connection of the inferior fourth ventricle with the cisterna magna, a congenital Dandy-Walker type variant which is stable.  There is no mass, hemorrhage, extra-axial fluid collection, or midline shift. There is mild small vessel disease in the centra semiovale bilaterally. There is a prior small lacunar infarct in the periphery of the left mid  cerebellum. There is a stable prior lacunar infarct in the superior aspect of the head of the caudate nucleus on right. There is no acute appearing infarct.  Bony calvarium appears intact.  Mastoid air cells are clear.  IMPRESSION: Atrophy with mild small vessel disease. Prior small lacunar infarcts in the mid left cerebellum in superior aspect of the head of the caudate nucleus on the right. There is no intracranial mass, hemorrhage, or acute appearing infarct.   Electronically Signed   By: Bretta Bang M.D.   On: 04/03/2013 10:46  Results for orders placed during the hospital encounter of 04/03/13  ETHANOL      Result Value Ref Range   Alcohol, Ethyl (B) <11  0 - 11 mg/dL  PROTIME-INR      Result Value Ref Range   Prothrombin Time 13.1  11.6 - 15.2 seconds   INR 1.01  0.00 - 1.49  APTT      Result Value Ref Range   aPTT 24  24 - 37 seconds  CBC      Result Value Ref  Range   WBC 6.3  4.0 - 10.5 K/uL   RBC 3.94 (*) 4.22 - 5.81 MIL/uL   Hemoglobin 11.1 (*) 13.0 - 17.0 g/dL   HCT 45.4 (*) 09.8 - 11.9 %   MCV 86.5  78.0 - 100.0 fL   MCH 28.2  26.0 - 34.0 pg   MCHC 32.6  30.0 - 36.0 g/dL   RDW 14.7 (*) 82.9 - 56.2 %   Platelets 240  150 - 400 K/uL  DIFFERENTIAL      Result Value Ref Range   Neutrophils Relative % 75  43 - 77 %   Neutro Abs 4.7  1.7 - 7.7 K/uL   Lymphocytes Relative 17  12 - 46 %   Lymphs Abs 1.1  0.7 - 4.0 K/uL   Monocytes Relative 6  3 - 12 %   Monocytes Absolute 0.4  0.1 - 1.0 K/uL   Eosinophils Relative 2  0 - 5 %   Eosinophils Absolute 0.1  0.0 - 0.7 K/uL   Basophils Relative 0  0 - 1 %   Basophils Absolute 0.0  0.0 - 0.1 K/uL  COMPREHENSIVE METABOLIC PANEL      Result Value Ref Range   Sodium 144  137 - 147 mEq/L   Potassium 5.3  3.7 - 5.3 mEq/L   Chloride 103  96 - 112 mEq/L   CO2 27  19 - 32 mEq/L   Glucose, Bld 134 (*) 70 - 99 mg/dL   BUN 16  6 - 23 mg/dL   Creatinine, Ser 1.30  0.50 - 1.35 mg/dL   Calcium 86.5  8.4 - 78.4 mg/dL   Total Protein 7.1  6.0 - 8.3 g/dL   Albumin 3.3 (*) 3.5 - 5.2 g/dL   AST 23  0 - 37 U/L   ALT 15  0 - 53 U/L   Alkaline Phosphatase 116  39 - 117 U/L   Total Bilirubin 0.3  0.3 - 1.2 mg/dL   GFR calc non Af Amer 76 (*) >90 mL/min   GFR calc Af Amer 88 (*) >90 mL/min  URINE RAPID DRUG SCREEN (HOSP PERFORMED)      Result Value Ref Range   Opiates NONE DETECTED  NONE DETECTED   Cocaine NONE DETECTED  NONE DETECTED   Benzodiazepines NONE DETECTED  NONE DETECTED   Amphetamines NONE DETECTED  NONE DETECTED   Tetrahydrocannabinol NONE DETECTED  NONE DETECTED   Barbiturates NONE DETECTED  NONE DETECTED  URINALYSIS, ROUTINE W REFLEX MICROSCOPIC      Result Value Ref Range   Color, Urine YELLOW  YELLOW   APPearance CLEAR  CLEAR   Specific Gravity, Urine 1.007  1.005 - 1.030   pH 6.0  5.0 - 8.0   Glucose, UA NEGATIVE  NEGATIVE mg/dL   Hgb urine dipstick NEGATIVE  NEGATIVE   Bilirubin  Urine NEGATIVE  NEGATIVE   Ketones, ur NEGATIVE  NEGATIVE mg/dL   Protein, ur 30 (*) NEGATIVE mg/dL   Urobilinogen, UA 0.2  0.0 - 1.0 mg/dL   Nitrite NEGATIVE  NEGATIVE   Leukocytes, UA NEGATIVE  NEGATIVE  URINE MICROSCOPIC-ADD ON      Result Value Ref Range   WBC, UA 0-2  <3 WBC/hpf   RBC / HPF 0-2  <3 RBC/hpf  HEMOGLOBIN A1C      Result Value Ref Range   Hemoglobin A1C 6.8 (*) <5.7 %   Mean Plasma Glucose 148 (*) <117 mg/dL  LIPID PANEL      Result Value Ref Range   Cholesterol 125  0 - 200 mg/dL   Triglycerides 49  <161 mg/dL   HDL 52  >09 mg/dL   Total CHOL/HDL Ratio 2.4     VLDL 10  0 - 40 mg/dL   LDL Cholesterol 63  0 - 99 mg/dL  HEMOGLOBIN U0A      Result Value Ref Range   Hemoglobin A1C 6.8 (*) <5.7 %   Mean Plasma Glucose 148 (*) <117 mg/dL  CBC      Result Value Ref Range   WBC 8.6  4.0 - 10.5 K/uL   RBC 4.07 (*) 4.22 - 5.81 MIL/uL   Hemoglobin 11.6 (*) 13.0 - 17.0 g/dL   HCT 54.0 (*) 98.1 - 19.1 %   MCV 85.3  78.0 - 100.0 fL   MCH 28.5  26.0 - 34.0 pg   MCHC 33.4  30.0 - 36.0 g/dL   RDW 47.8 (*) 29.5 - 62.1 %   Platelets 235  150 - 400 K/uL  CREATININE, SERUM      Result Value Ref Range   Creatinine, Ser 0.95  0.50 - 1.35 mg/dL   GFR calc non Af Amer 77 (*) >90 mL/min   GFR calc Af Amer 89 (*) >90 mL/min  GLUCOSE, CAPILLARY      Result Value Ref Range   Glucose-Capillary 104 (*) 70 - 99 mg/dL   Comment 1 Notify RN     Comment 2 Documented in Chart    GLUCOSE, CAPILLARY      Result Value Ref Range   Glucose-Capillary 85  70 - 99 mg/dL   Comment 1 Notify RN     Comment 2 Documented in Chart    GLUCOSE, CAPILLARY      Result Value Ref Range   Glucose-Capillary 107 (*) 70 - 99 mg/dL  GLUCOSE, CAPILLARY      Result Value Ref Range   Glucose-Capillary 112 (*) 70 - 99 mg/dL  GLUCOSE, CAPILLARY      Result Value Ref Range   Glucose-Capillary 142 (*) 70 - 99 mg/dL   Comment 1 Documented in Chart     Comment 2 Notify RN    GLUCOSE, CAPILLARY       Result Value Ref Range   Glucose-Capillary 103 (*) 70 - 99 mg/dL   Comment 1 Documented in Chart     Comment 2 Notify RN  GLUCOSE, CAPILLARY      Result Value Ref Range   Glucose-Capillary 109 (*) 70 - 99 mg/dL   Comment 1 Documented in Chart     Comment 2 Notify RN    GLUCOSE, CAPILLARY      Result Value Ref Range   Glucose-Capillary 176 (*) 70 - 99 mg/dL   Comment 1 Documented in Chart     Comment 2 Notify RN    GLUCOSE, CAPILLARY      Result Value Ref Range   Glucose-Capillary 173 (*) 70 - 99 mg/dL   Comment 1 Documented in Chart     Comment 2 Notify RN    GLUCOSE, CAPILLARY      Result Value Ref Range   Glucose-Capillary 143 (*) 70 - 99 mg/dL  GLUCOSE, CAPILLARY      Result Value Ref Range   Glucose-Capillary 101 (*) 70 - 99 mg/dL  GLUCOSE, CAPILLARY      Result Value Ref Range   Glucose-Capillary 156 (*) 70 - 99 mg/dL  I-STAT TROPOININ, ED      Result Value Ref Range   Troponin i, poc 0.05  0.00 - 0.08 ng/mL   Comment 3            A/P: Pt with known severe cerebrovascular disease/PVD, CAD,HTN,DM,prior CVA and left MCA PTA 2009; now presenting with left sided weakness and slurred speech but no acute CVA- unable to get MRI due to pacer ; will plan to review latest imaging studies with Dr. Corliss Skainseveshwar on 3/23 and decide whether f/u cerebral angiography is next step . Above d/w pt/wife.

## 2013-04-06 NOTE — Progress Notes (Signed)
Subjective:  More alert and awake today no complaints  Objective:  Vital Signs in the last 24 hours: Temp:  [98 F (36.7 C)-99.6 F (37.6 C)] 98 F (36.7 C) (03/22 0918) Pulse Rate:  [60-80] 80 (03/22 0918) Resp:  [18-20] 18 (03/22 0918) BP: (124-164)/(70-79) 164/70 mmHg (03/22 0918) SpO2:  [91 %-100 %] 100 % (03/22 0918)  Intake/Output from previous day: 03/21 0701 - 03/22 0700 In: -  Out: 575 [Urine:575] Intake/Output from this shift:    Physical Exam: Neck: no adenopathy, no carotid bruit, no JVD and supple, symmetrical, trachea midline Lungs: clear to auscultation bilaterally Heart: regular rate and rhythm, S1, S2 normal, no murmur, click, rub or gallop Abdomen: soft, non-tender; bowel sounds normal; no masses,  no organomegaly Extremities: Bilateral BKA  Lab Results:  Recent Labs  04/03/13 2145  WBC 8.6  HGB 11.6*  PLT 235    Recent Labs  04/03/13 2145  CREATININE 0.95   No results found for this basename: TROPONINI, CK, MB,  in the last 72 hours Hepatic Function Panel No results found for this basename: PROT, ALBUMIN, AST, ALT, ALKPHOS, BILITOT, BILIDIR, IBILI,  in the last 72 hours  Recent Labs  04/04/13 0208  CHOL 125   No results found for this basename: PROTIME,  in the last 72 hours  Imaging: Imaging results have been reviewed and Ct Angio Head W/cm &/or Wo Cm  04/04/2013   CLINICAL DATA:  Slurred speech.  Left-sided weakness.  EXAM: CT ANGIOGRAPHY HEAD  TECHNIQUE: Multidetector CT imaging of the head was performed using the standard protocol during bolus administration of intravenous contrast. Multiplanar CT image reconstructions and MIPs were obtained to evaluate the vascular anatomy.  CONTRAST:  50mL OMNIPAQUE IOHEXOL 350 MG/ML SOLN  COMPARISON:  Head CT 04/03/2013 and multiple previous. Angiography 10/16/2007.  FINDINGS: Again seen are chronic appearing small vessel ischemic changes throughout the white matter an old infarction in the right  caudate in the left pons. There is no visible acute infarction, mass lesion, hemorrhage, hydrocephalus or extra-axial collection.  Right internal carotid artery: Atherosclerotic narrowing as the internal carotid artery passes through the base of the brain. Diameter in that segment is as small as 2.5 mm. This indicates a 50% stenosis. There is atherosclerotic calcification and narrowing within the carotid siphon. The proximal siphon, diameter is as small as 1.5 mm. The supra clinoid ICA is stenotic as well with diameter as small as 1.5 mm. The right anterior and middle cerebral vessels are patent but show atherosclerotic irregularity. There is stenosis at the right anterior cerebral origin estimated at 70%. I do not see a correctable stenosis in the right MCA. No large missing branch vessels.  Left internal carotid artery: The vessel is widely patent through the skullbase. There is atherosclerotic disease in the carotid siphon and at the supra clinoid ICA. Narrowing in the siphon is as severe as 1.5 mm at the anterior aspect. The supra clinoid internal carotid artery is stenotic with a diameter of 1.5 mm. The left anterior cerebral artery is widely patent proximally. The M1 segment appears widely patent. There is severe stenosis of the posterior division of the MCA at the bifurcation. Vessel is less than a mm and the patient would be at risk of major left MCA distribution infarction.  Right vertebral artery is widely patent to the basilar. Right PICA is patent. Left vertebral artery is widely patent to the basilar. Left PICA is patent. Basilar artery shows atherosclerotic irregularity without a focal flow-limiting  stenosis. The superior cerebellar and posterior cerebral arteries are patent with pronounced atherosclerotic irregularity and narrowing in both PCA branches.  Review of the MIP images confirms the above findings.  IMPRESSION: No identifiable acute infarction. No identifiable acute embolus or embolic  occlusion.  Severe atherosclerotic disease of the right ICA as it passes through the skullbase and of both carotid siphon regions and supra clinoid internal carotid arteries. Serial stenoses in those regions of 70% or greater. 70% stenosis at the origin of the right anterior cerebral artery. Severe stenosis (80% or greater) of the main posterior branch of the left MCA just beyond the MCA bifurcation. This would place the patient at risk of left MCA territory infarction.  Pronounced atherosclerotic narrowing and irregularity of the PCA branches bilaterally.   Electronically Signed   By: Paulina FusiMark  Shogry M.D.   On: 04/04/2013 18:55    Cardiac Studies:  Assessment/Plan:  Probable Recurrent CVA  Severe cerebrovascular disease  History of CVA in the past status post PTA to left mid cerebral artery in the past  Coronary artery disease history of MI in the past  Ischemic cardiomyopathy  Compensated systolic heart failure in  History of complete heart block status post permanent pacemaker  Hypertension  Non-insulin-dependent diabetes mellitus  Severe peripheral vascular disease status post bilateral below-knee amputation in the past  Hypercholesteremia Plan Continue present management Increase ambulation as tolerated Awaiting interventional radiology consult  LOS: 3 days    Eugenio Dollins N 04/06/2013, 9:30 AM

## 2013-04-07 ENCOUNTER — Inpatient Hospital Stay (HOSPITAL_COMMUNITY): Payer: Medicare Other

## 2013-04-07 DIAGNOSIS — I6330 Cerebral infarction due to thrombosis of unspecified cerebral artery: Secondary | ICD-10-CM

## 2013-04-07 LAB — HEMOGLOBIN A1C
HEMOGLOBIN A1C: 6.8 % — AB (ref <5.7)
Hgb A1c MFr Bld: 6.8 % — ABNORMAL HIGH (ref <5.7)
MEAN PLASMA GLUCOSE: 148 mg/dL — AB (ref <117)
MEAN PLASMA GLUCOSE: 148 mg/dL — AB (ref <117)

## 2013-04-07 LAB — GLUCOSE, CAPILLARY
GLUCOSE-CAPILLARY: 122 mg/dL — AB (ref 70–99)
GLUCOSE-CAPILLARY: 132 mg/dL — AB (ref 70–99)
GLUCOSE-CAPILLARY: 115 mg/dL — AB (ref 70–99)
Glucose-Capillary: 113 mg/dL — ABNORMAL HIGH (ref 70–99)
Glucose-Capillary: 120 mg/dL — ABNORMAL HIGH (ref 70–99)

## 2013-04-07 LAB — CBC
HCT: 36 % — ABNORMAL LOW (ref 39.0–52.0)
Hemoglobin: 12.2 g/dL — ABNORMAL LOW (ref 13.0–17.0)
MCH: 28.6 pg (ref 26.0–34.0)
MCHC: 33.9 g/dL (ref 30.0–36.0)
MCV: 84.5 fL (ref 78.0–100.0)
PLATELETS: 240 10*3/uL (ref 150–400)
RBC: 4.26 MIL/uL (ref 4.22–5.81)
RDW: 15.9 % — AB (ref 11.5–15.5)
WBC: 6.8 10*3/uL (ref 4.0–10.5)

## 2013-04-07 LAB — BASIC METABOLIC PANEL
BUN: 16 mg/dL (ref 6–23)
CO2: 20 mEq/L (ref 19–32)
CREATININE: 1.02 mg/dL (ref 0.50–1.35)
Calcium: 10.1 mg/dL (ref 8.4–10.5)
Chloride: 104 mEq/L (ref 96–112)
GFR, EST AFRICAN AMERICAN: 78 mL/min — AB (ref >90)
GFR, EST NON AFRICAN AMERICAN: 67 mL/min — AB (ref >90)
Glucose, Bld: 111 mg/dL — ABNORMAL HIGH (ref 70–99)
Potassium: 4.7 mEq/L (ref 3.7–5.3)
Sodium: 140 mEq/L (ref 137–147)

## 2013-04-07 MED ORDER — HYDRALAZINE HCL 20 MG/ML IJ SOLN
INTRAMUSCULAR | Status: AC
  Filled 2013-04-07: qty 1

## 2013-04-07 MED ORDER — MIDAZOLAM HCL 2 MG/2ML IJ SOLN
INTRAMUSCULAR | Status: AC
  Filled 2013-04-07: qty 2

## 2013-04-07 MED ORDER — HEPARIN SOD (PORK) LOCK FLUSH 100 UNIT/ML IV SOLN
INTRAVENOUS | Status: AC | PRN
  Administered 2013-04-07: 1000 [IU] via INTRAVENOUS

## 2013-04-07 MED ORDER — FENTANYL CITRATE 0.05 MG/ML IJ SOLN
INTRAMUSCULAR | Status: AC
  Filled 2013-04-07: qty 2

## 2013-04-07 MED ORDER — HYDRALAZINE HCL 20 MG/ML IJ SOLN
INTRAMUSCULAR | Status: AC | PRN
  Administered 2013-04-07 (×2): 2.5 mg via INTRAVENOUS

## 2013-04-07 MED ORDER — MIDAZOLAM HCL 2 MG/2ML IJ SOLN
INTRAMUSCULAR | Status: AC | PRN
  Administered 2013-04-07: 1 mg via INTRAVENOUS

## 2013-04-07 MED ORDER — IOHEXOL 300 MG/ML  SOLN
100.0000 mL | Freq: Once | INTRAMUSCULAR | Status: AC | PRN
  Administered 2013-04-07: 80 mL via INTRAVENOUS

## 2013-04-07 MED ORDER — FENTANYL CITRATE 0.05 MG/ML IJ SOLN
INTRAMUSCULAR | Status: AC | PRN
  Administered 2013-04-07: 25 ug via INTRAVENOUS

## 2013-04-07 NOTE — Progress Notes (Signed)
OT Cancellation Note  Patient Details Name: Conchita ParisBilly Reister MRN: [id number] DOB: 03/27/1931   Cancelled Treatment:    Reason Eval/Treat Not Completed: Patient at procedure or test/ unavailable  Earlie RavelingStraub, Cimone Fahey L OTR/L [phone] 04/07/2013, 1:05 PM

## 2013-04-07 NOTE — Clinical Social Work Note (Signed)
Clinical Social Work Department BRIEF PSYCHOSOCIAL ASSESSMENT 04/07/2013  Patient:  [name],[name]     Account Number:  [id number]     Admit date:  04/03/2013  Clinical Social Worker:  Sherre LainSUMMERVILLE,EMILY, LCSWA  Date/Time:  04/07/2013 02:05 PM  Referred by:  Physician  Date Referred:  04/07/2013 Referred for  SNF Placement   Other Referral:   none.   Interview type:  Family Other interview type:   CSW spoke with pt's daughter, [name].    PSYCHOSOCIAL DATA Living Status:  FAMILY Admitted from facility:   Level of care:   Primary support name:  [name] Primary support relationship to patient:  CHILD, ADULT Degree of support available:   Pt lives with his daughter and she is primary caregiver.    CURRENT CONCERNS Current Concerns  Post-Acute Placement   Other Concerns:   none.    SOCIAL WORK ASSESSMENT / PLAN CSW was consulted for SNF placement as a back-up to PT/OT recommendation for CIR at time of discharge. CSW spoke with pt's daughter, [name], who stated that pt has been to SNF previously and that family would be agreeable to pt returning if needed. Per [name], [facility]. [name] stated that family and pt were [facility]. Per [name], pt and family's goal is for pt to return to living with [name].    CSW to continue to follow and assist with discharge in the event that CIR is unable to admit pt.   Assessment/plan status:  Psychosocial Support/Ongoing Assessment of Needs Other assessment/ plan:   none.   Information/referral to community resources:   [facility]    PATIENTS/FAMILYS RESPONSE TO PLAN OF CARE: [name] understanding and agreeable to CSW plan of care. [name] expressed no questions or concerns at this time.       Darlyn ChamberEmily Summerville, LCSWA Clinical Social Worker [phone]

## 2013-04-07 NOTE — Procedures (Signed)
S/P 4 vessle cerebral arteriogram  RT CFA approach. Findings. 1.Approx 85 % stenosis of LT ICA prox. 2.Approx 50 % stenosis LT ICA cavernous seg. 3.Severe stenosis RT ACA origin and RT PCA P12  Junction. 4. 50 % stenosis RT MCA M1 seg amd Inf dividion of Lt MCAprox.

## 2013-04-07 NOTE — Clinical Social Work Placement (Signed)
Clinical Social Work Department CLINICAL SOCIAL WORK PLACEMENT NOTE 04/07/2013  Patient:  Conchita ParisWATSON,Revanth  Account Number:  [id number] Admit date:  04/03/2013  Clinical Social Worker:  Irving BurtonEMILY SUMMERVILLE, LCSWA  Date/time:  04/07/2013 02:10 PM  Clinical Social Work is seeking post-discharge placement for this patient at the following level of care:   SKILLED NURSING   (*CSW will update this form in Epic as items are completed)   04/07/2013  Patient/family provided with [facility] of Clinical Social Works list of facilities offering this level of care within the geographic area requested by the patient (or if unable, by the patients family).  04/07/2013  Patient/family informed of their freedom to choose among providers that offer the needed level of care, that participate in Medicare, Medicaid or managed care program needed by the patient, have an available bed and are willing to accept the patient.  04/07/2013  Patient/family informed of [facility] ownership interest in [facility], as well as of the fact that they are under no obligation to receive care at this facility.  PASARR submitted to EDS on  PASARR number received from EDS on   FL2 transmitted to all facilities in geographic area requested by pt/family on  04/07/2013 FL2 transmitted to all facilities within larger geographic area on   Patient informed that his/her managed care company has contracts with or will negotiate with  certain facilities, including the following:     Patient/family informed of bed offers received:   Patient chooses bed at  Physician recommends and patient chooses bed at    Patient to be transferred to  on   Patient to be transferred to facility by   The following physician request were entered in Epic:   Additional Comments: PASARR already existing  Darlyn ChamberEmily [place], [facility] Clinical Social Worker [phone]

## 2013-04-07 NOTE — Consult Note (Signed)
Physical Medicine and Rehabilitation Consult Reason for Consult: CVA Referring Physician: Dr.Harwani   HPI: [name] is a 80 y.o. right-handed male with history of CAD/ischemic cardiomyopathy, complete heart block status post pacemaker, systolic congestive heart failure, diabetes mellitus and peripheral neuropathy and right below-knee amputation 7 years ago and left below-knee amputations 08/02/2011 and was discharged to skilled nursing facility at that time. Patient lives with family use a rolling walker and bilateral prosthesis. Presented 04/03/2013 with left-sided weakness and slurred speech. CT of the head showed prior small lacunar infarcts in the mid left cerebellum superior aspect of the head of the caudate nucleus on the  Right. MRI not completed secondary to pacemaker. CT angiogram the head with no identifiable acute infarct. Echocardiogram with ejection fraction of 30% grade 1 diastolic dysfunction. Carotid Dopplers with left ICA stenosis 80-99%. Neurology service followup presently maintained on aspirin for CVA prophylaxis with Plavix as well as subcutaneous heparin for DVT prophylaxis. Await vascular surgery followup to address carotid stenosis. Physical and occupational therapy evaluations completed with recommendations of physical medicine rehabilitation consult.   Review of Systems  Respiratory: Positive for shortness of breath.   Gastrointestinal: Positive for constipation.  Musculoskeletal: Positive for myalgias.  Neurological: Positive for weakness.  All other systems reviewed and are negative.   Past Medical History  Diagnosis Date  . Hypercholesteremia     takes Lipitor daily  . CAD (coronary artery disease)     s/p PTCA to PLV branch in 2002  . Non-insulin dependent diabetes mellitus   . PVD (peripheral vascular disease)     s/p right BKA  . CVA (cerebral infarction)     s/p PTA to middle cerebral artery;slurred speech  . HTN (hypertension)     takes  Benazepril daily  . Pacemaker   . Shortness of breath     sitting as well as standing  . Glaucoma   . Complete heart block     s/p Medtronic PPM by JA 09/2009  . Stroke    Past Surgical History  Procedure Laterality Date  . Pacemaker insertion  09/2010  . Appendectomy    . Amputation      below amputation  . Cardiac catheterization      2002/2009  . Amputation  08/02/2011    Procedure: AMPUTATION BELOW KNEE;  Surgeon: Larina Earthly, MD;  Location: [facility] OR;  Service: Vascular;  Laterality: Left;  . Insert / replace / remove pacemaker     Family History  Problem Relation Age of Onset  . Heart failure Mother   . Heart failure Father   . Prostate cancer Brother    Social History:  reports that he has never smoked. He has never used smokeless tobacco. He reports that he does not drink alcohol or use illicit drugs. Allergies: No Known Allergies Medications Prior to Admission  Medication Sig Dispense Refill  . acetaminophen (TYLENOL) 325 MG tablet Take 325 mg by mouth every 6 (six) hours as needed for mild pain.      Marland Kitchen aspirin EC 81 MG tablet Take 81 mg by mouth daily.      Marland Kitchen atorvastatin (LIPITOR) 80 MG tablet Take 80 mg by mouth daily.      . carvedilol (COREG) 6.25 MG tablet Take 6.25 mg by mouth 2 (two) times daily with a meal.      . clopidogrel (PLAVIX) 75 MG tablet Take 75 mg by mouth daily.      . furosemide (LASIX) 40  MG tablet Take 40 mg by mouth daily.      Marland Kitchen ibuprofen (ADVIL,MOTRIN) 200 MG tablet Take 200 mg by mouth daily as needed for mild pain.      . isosorbide mononitrate (IMDUR) 60 MG 24 hr tablet Take 60 mg by mouth daily.      . nitroGLYCERIN (NITROSTAT) 0.4 MG SL tablet Place 0.4 mg under the tongue every 5 (five) minutes as needed for chest pain.      . pantoprazole (PROTONIX) 40 MG tablet Take 40 mg by mouth daily.      . ramipril (ALTACE) 5 MG capsule Take 5 mg by mouth daily.      . traZODone (DESYREL) 100 MG tablet Take 100 mg by mouth at bedtime as needed for  sleep.        Home: Home Living Family/patient expects to be discharged to:: Private residence Living Arrangements: Children Available Help at Discharge: Family;Available 24 hours/day Type of Home: House Home Layout: One level Home Equipment: Walker - 2 wheels;Wheelchair - manual  Functional History: Prior Function Comments: used RW and bil. prostheses for amb Functional Status:  Mobility:          ADL: ADL Eating/Feeding: NPO Where Assessed - Eating/Feeding: Bed level Grooming: Wash/dry face;Maximal assistance Where Assessed - Grooming: Supine, head of bed up Upper Body Bathing: +1 Total assistance Where Assessed - Upper Body Bathing: Supine, head of bed up;Rolling right and/or left Lower Body Bathing: +1 Total assistance Where Assessed - Lower Body Bathing: Supine, head of bed up;Rolling right and/or left Upper Body Dressing: Maximal assistance Where Assessed - Upper Body Dressing: Supine, head of bed up Lower Body Dressing: +1 Total assistance Where Assessed - Lower Body Dressing: Supine, head of bed up;Rolling right and/or left  Cognition: Cognition Overall Cognitive Status: No family/caregiver present to determine baseline cognitive functioning Arousal/Alertness: Awake/alert Orientation Level: Oriented to person;Oriented to place Attention: Sustained Sustained Attention: Impaired Sustained Attention Impairment: Verbal complex Executive Function: Self Monitoring Self Monitoring: Impaired Self Monitoring Impairment: Verbal basic Behaviors: Impulsive Cognition Arousal/Alertness: Lethargic Behavior During Therapy: Restless Overall Cognitive Status: No family/caregiver present to determine baseline cognitive functioning Area of Impairment: Memory;Orientation Orientation Level: Disoriented to;Time Memory: Decreased short-term memory General Comments: asked same question repeatedly and where his wife went Difficult to assess due to: Impaired  communication  Blood pressure 173/89, pulse 64, temperature 98.2 F (36.8 C), temperature source Oral, resp. rate 18, height 5' (1.524 m), weight 72.576 kg (160 lb), SpO2 100.00%. Physical Exam  HENT:  Head: Normocephalic.  Eyes: EOM are normal.  Neck: Normal range of motion. Neck supple. No thyromegaly present.  Cardiovascular: Normal rate and regular rhythm.   Respiratory: Effort normal and breath sounds normal. No respiratory distress.  GI: Soft. Bowel sounds are normal. He exhibits no distension.  Neurological: Coordination normal.  Patient is pleasantly confused. He needed multiple cues for place as well as situation. He was a poor medical historian. He did follow simple commands. Mild LUE weakness 4-/5. LLE 4- HF/KE. RUE and RLE 4+.  Skin:  Bilateral amputation sites clean and dry  Psychiatric: He has a normal mood and affect.    Results for orders placed during the hospital encounter of 04/03/13 (from the past 24 hour(s))  GLUCOSE, CAPILLARY     Status: Abnormal   Collection Time    04/06/13  6:55 AM      Result Value Ref Range   Glucose-Capillary 101 (*) 70 - 99 mg/dL  GLUCOSE, CAPILLARY  Status: Abnormal   Collection Time    04/06/13  9:22 AM      Result Value Ref Range   Glucose-Capillary 156 (*) 70 - 99 mg/dL  GLUCOSE, CAPILLARY     Status: Abnormal   Collection Time    04/06/13 11:36 AM      Result Value Ref Range   Glucose-Capillary 160 (*) 70 - 99 mg/dL  GLUCOSE, CAPILLARY     Status: Abnormal   Collection Time    04/06/13  4:05 PM      Result Value Ref Range   Glucose-Capillary 154 (*) 70 - 99 mg/dL  GLUCOSE, CAPILLARY     Status: Abnormal   Collection Time    04/06/13  9:38 PM      Result Value Ref Range   Glucose-Capillary 166 (*) 70 - 99 mg/dL  CBC     Status: Abnormal   Collection Time    04/07/13  4:35 AM      Result Value Ref Range   WBC 6.8  4.0 - 10.5 K/uL   RBC 4.26  4.22 - 5.81 MIL/uL   Hemoglobin 12.2 (*) 13.0 - 17.0 g/dL   HCT 16.1 (*)  09.6 - 52.0 %   MCV 84.5  78.0 - 100.0 fL   MCH 28.6  26.0 - 34.0 pg   MCHC 33.9  30.0 - 36.0 g/dL   RDW 04.5 (*) 40.9 - 81.1 %   Platelets 240  150 - 400 K/uL  BASIC METABOLIC PANEL     Status: Abnormal   Collection Time    04/07/13  4:35 AM      Result Value Ref Range   Sodium 140  137 - 147 mEq/L   Potassium 4.7  3.7 - 5.3 mEq/L   Chloride 104  96 - 112 mEq/L   CO2 20  19 - 32 mEq/L   Glucose, Bld 111 (*) 70 - 99 mg/dL   BUN 16  6 - 23 mg/dL   Creatinine, Ser 9.14  0.50 - 1.35 mg/dL   Calcium 78.2  8.4 - 95.6 mg/dL   GFR calc non Af Amer 67 (*) >90 mL/min   GFR calc Af Amer 78 (*) >90 mL/min   No results found.  Assessment/Plan: Diagnosis: ?new right cva 1. Does the need for close, 24 hr/day medical supervision in concert with the patient's rehab needs make it unreasonable for this patient to be served in a less intensive setting? Potentially 2. Co-Morbidities requiring supervision/potential complications: hx of bilateral BKA's. 3. Due to bladder management, bowel management, safety, skin/wound care, disease management, medication administration, pain management and patient education, does the patient require 24 hr/day rehab nursing? Yes and Potentially 4. Does the patient require coordinated care of a physician, rehab nurse, PT (1-2 hrs/day, 5 days/week), OT (1-2 hrs/day, 5 days/week) and SLP (1-2 hrs/day, 5 days/week) to address physical and functional deficits in the context of the above medical diagnosis(es)? Yes and Potentially Addressing deficits in the following areas: balance, endurance, locomotion, strength, transferring, bowel/bladder control, bathing, dressing, feeding, grooming, toileting, cognition and psychosocial support 5. Can the patient actively participate in an intensive therapy program of at least 3 hrs of therapy per day at least 5 days per week? Yes and Potentially 6. The potential for patient to make measurable gains while on inpatient rehab is good and  fair 7. Anticipated functional outcomes upon discharge from inpatient rehab are supervision with PT, supervision to min assist with OT, supervision with SLP. 8. Estimated rehab  length of stay to reach the above functional goals is: 10-14 days 9. Does the patient have adequate social supports to accommodate these discharge functional goals? Yes 10. Anticipated D/C setting: Home 11. Anticipated post D/C treatments: HH therapy 12. Overall Rehab/Functional Prognosis: good  RECOMMENDATIONS: This patient's condition is appropriate for continued rehabilitative care in the following setting: CIR Patient has agreed to participate in recommended program. Potentially Note that insurance prior authorization may be required for reimbursement for recommended care.  Comment: I am unclear of patient's baseline. He demonstrates deficits on exam and functional needs with therapy however which would indicate a need for inpatient rehab. Rehab Admissions Coordinator to follow up. Would like to see results of follow up sessions with therapy today as well  Thanks,  Ranelle OysterZachary T. Herson Prichard, MD, [place] DomFAAPMR     04/07/2013

## 2013-04-07 NOTE — Progress Notes (Signed)
Patient currently not in room. I will follow up after he returns. [phone]

## 2013-04-07 NOTE — Progress Notes (Signed)
I met with pt's son at bedside to discuss rehab venue options. Previously pt was at supervision level with B BKAS and prosthesis who used his w/c in the home and then prosthesis with RW in the community. His wife is bedbound since 2009 CVA. Couple lives with their daughter Belinda Block and family provides 24/7 assist. I will follow his progress with therapy to assist with rehab venue plans. Pt previously at [facility] for first amputation and then SNF after second amputation. [phone]

## 2013-04-07 NOTE — Progress Notes (Signed)
Subjective:  Patient denies any chest pain or shortness of breath.  No further new weakness.  Scheduled for  four-vessel cerebral angiogram  Objective:  Vital Signs in the last 24 hours: Temp:  [97.1 F (36.2 C)-98.5 F (36.9 C)] 97.1 F (36.2 C) (03/23 1018) Pulse Rate:  [59-73] 62 (03/23 1018) Resp:  [18-20] 18 (03/23 1018) BP: (112-182)/(66-93) 170/78 mmHg (03/23 1018) SpO2:  [96 %-100 %] 96 % (03/23 1018)  Intake/Output from previous day: 03/22 0701 - 03/23 0700 In: -  Out: 1 [Stool:1] Intake/Output from this shift:    Physical Exam: Neck: no adenopathy, no carotid bruit, no JVD and supple, symmetrical, trachea midline Lungs: clear to auscultation bilaterally Heart: regular rate and rhythm, S1, S2 normal, no murmur, click, rub or gallop Abdomen: soft, non-tender; bowel sounds normal; no masses,  no organomegaly Extremities: status post bilateral BKA  Lab Results:  Recent Labs  04/07/13 0435  WBC 6.8  HGB 12.2*  PLT 240    Recent Labs  04/07/13 0435  NA 140  K 4.7  CL 104  CO2 20  GLUCOSE 111*  BUN 16  CREATININE 1.02   No results found for this basename: TROPONINI, CK, MB,  in the last 72 hours Hepatic Function Panel No results found for this basename: PROT, ALBUMIN, AST, ALT, ALKPHOS, BILITOT, BILIDIR, IBILI,  in the last 72 hours No results found for this basename: CHOL,  in the last 72 hours No results found for this basename: PROTIME,  in the last 72 hours  Imaging: Imaging results have been reviewed and No results found.  Cardiac Studies:  Assessment/Plan:  Probable Recurrent CVA  Severe cerebrovascular disease  History of CVA in the past status post PTA to left mid cerebral artery in the past  Coronary artery disease history of MI in the past  Ischemic cardiomyopathy  Compensated systolic heart failure in  History of complete heart block status post permanent pacemaker  Hypertension  Non-insulin-dependent diabetes mellitus  Severe  peripheral vascular disease status post bilateral below-knee amputation in the past  Hypercholesteremia Plan Continue present management Scheduled for cerebral angiogram today  LOS: 4 days    Maxmilian Trostel N 04/07/2013, 12:33 PM

## 2013-04-07 NOTE — Progress Notes (Signed)
PT Cancellation Note  Patient Details Name: [name] MRN: [id number] DOB: 03/27/1931   Cancelled Treatment:    Reason Eval/Treat Not Completed: Patient at procedure or test/unavailable. Will follow.   Ralene BatheUhlenberg, Renika Shiflet Kistler 04/07/2013, 12:59 PM [phone]

## 2013-04-07 NOTE — Progress Notes (Signed)
UR complete.  Chrisopher Pustejovsky RN, MSN 

## 2013-04-07 NOTE — Progress Notes (Signed)
Patient ID: [name] ParisBilly [name], male   DOB: 03/27/1931, 80 y.o.   MRN: [id number]   Pt with Hx L MCA pta in IR 2009 Recent new symptoms of L weakness and slurred speech Was asked to see pt for possible anngiogram Dr Corliss Skainseveshwar has reviewed case and does feel pt would benefit from cerebral arteriogram  Pt has been seen and evaluated Note in chart Wife is aware of procedure as per note from yesterday Unable to reach wife or children on phone for consent  RN to call me if family comes to room We will continue to try #s in IR --attempt to gain consent

## 2013-04-08 LAB — GLUCOSE, CAPILLARY
GLUCOSE-CAPILLARY: 99 mg/dL (ref 70–99)
GLUCOSE-CAPILLARY: 156 mg/dL — AB (ref 70–99)
GLUCOSE-CAPILLARY: 110 mg/dL — AB (ref 70–99)
Glucose-Capillary: 167 mg/dL — ABNORMAL HIGH (ref 70–99)

## 2013-04-08 MED ORDER — RAMIPRIL 5 MG PO CAPS
5.0000 mg | ORAL_CAPSULE | Freq: Two times a day (BID) | ORAL | Status: DC
  Administered 2013-04-08 – 2013-04-15 (×13): 5 mg via ORAL
  Filled 2013-04-08 (×18): qty 1

## 2013-04-08 MED ORDER — SODIUM CHLORIDE 0.9 % IV SOLN
INTRAVENOUS | Status: AC
  Administered 2013-04-08: 20:00:00 via INTRAVENOUS

## 2013-04-08 NOTE — Progress Notes (Signed)
Speech Language Pathology Treatment: Dysphagia  Patient Details Name: Azul Brumett MRN: [id number] DOB: Apr 02, 1931 Today's Date: 04/08/2013 Time: 5573-2202 SLP Time Calculation (min): 27 min  Assessment / Plan / Recommendation Clinical Impression  Patient was seen for assessment of diet tolerance and potential for diet advancement (on Dys 2). Pt. Appears to be tolerating current diet without overt s/s of aspiration.  Pt. Attempted chewing a graham cracker, but required sips of H2O to help moisten and break-up the cracker.  Pt. Was able to chew a saltine, but had residual crumbs in mouth, and he was unaware.  Will continue current diet, in light of pt. Being edentulous and having cognitive deficits.   HPI HPI: Patient is an 80 y.o. male admitted to hospital secondary to difficulty moving, slurred speech, and left-sided weakness. PMH: CAD, MI, ischemic cardiomyopathy, CHF, complete heart block, s/p permanent pacemaker placement, HTN, non-insulin dependent DM, PVD, s/p BKA, h/o CVA and PTA to middle cerebral artery.   Pertinent Vitals Afebrile; CXR: Cardiomegaly without acute disease  Head CT: There is no intracranial mass, hemorrhage, or acute appearing infarct.   SLP Plan  Discharge SLP treatment due to (comment);All goals met    Recommendations Diet recommendations: Dysphagia 2 (fine chop);Thin liquid Liquids provided via: Cup Medication Administration: Whole meds with puree Supervision: Full supervision/cueing for compensatory strategies Compensations: Slow rate;Small sips/bites Postural Changes and/or Swallow Maneuvers: Seated upright 90 degrees              Oral Care Recommendations: Oral care BID Follow up Recommendations: Inpatient Rehab Plan: Discharge SLP treatment due to (comment);All goals met D/C ST for swallowing as pt. Will need to remain on this diet.    GO     Quinn Axe T 04/08/2013, 4:18 PM

## 2013-04-08 NOTE — Progress Notes (Signed)
Patient ID: [name] ParisBilly Stanger, male   DOB: 03-19-1931, 80 y.o.   MRN: [id number] Discussed findings with Dr Sharyn LullHarwani. And the family.[name]. Lt ICA severe stenosis probable  Etiology  of his rt sided symptoms.despite medical management. Moderately severe diffuse intracranial  arteriosclerotic disease. Significant medical .comorbidities.. Carotid revascularization would be appropriate to prevent future ischemic events.. Dr Corliss Skainseveshwar

## 2013-04-08 NOTE — Progress Notes (Signed)
Physical Therapy Treatment Patient Details Name: Draco Malczewski MRN: [id number] DOB: 10-31-1931 Today's Date: 04/08/2013    History of Present Illness Patient is 80 year old male with past medical history significant for multiple medical problems i.e. 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 Patient denies any seizure activities. Denies any headaches in ED his motor strength is improved but continues to have slurred speech. Denies any blurring of vision but states he feels numb all over. Patient denies any palpitation lightheadedness or syncope. Denies any chest pain denies shortness of breath    PT Comments    Assisted patient back to bed via posterior anterior transfer. Patient fatigued this session and fell asleep when back to bed. Patient does not have sleeve or sock for L prothesis and unable to use at this time.   Follow Up Recommendations  CIR;Supervision/Assistance - 24 hour     Equipment Recommendations  None recommended by PT    Recommendations for Other Services       Precautions / Restrictions Precautions Precautions: Fall    Mobility  Bed Mobility Overal bed mobility: Needs Assistance         Sit to supine: Min assist   General bed mobility comments: cues for technique/sequencing.  Transfers Overall transfer level: Needs assistance   Transfers: Firefighter transfers: +2 physical assistance;Max assist   General transfer comment: Patient transferred +2 posterior anteriorly onto bed from recliner. A for all aspects with use of pad. patient able to lean forward and assist slightly.  Ambulation/Gait                 Stairs            Wheelchair Mobility    Modified Rankin (Stroke Patients Only) Modified Rankin (Stroke Patients Only) Pre-Morbid Rankin Score: Slight disability Modified Rankin: Severe disability     Balance     Sitting balance-Leahy Scale: Poor                              Cognition Arousal/Alertness: Awake/alert Behavior During Therapy: WFL for tasks assessed/performed Overall Cognitive Status: No family/caregiver present to determine baseline cognitive functioning   Orientation Level: Disoriented to;Place;Situation           Problem Solving: Slow processing;Difficulty sequencing;Requires verbal cues;Requires tactile cues      Exercises      General Comments        Pertinent Vitals/Pain Patient denied pain    Home Living                      Prior Function            PT Goals (current goals can now be found in the care plan section) Progress towards PT goals: Progressing toward goals    Frequency  Min 4X/week    PT Plan Current plan remains appropriate    End of Session  Activity Tolerance: Patient limited by fatigue;Patient tolerated treatment well Patient left: in bed;with call bell/phone within reach;with family/visitor present     Time: [id number] PT Time Calculation (min): 14 min  Charges:  $Therapeutic Activity: 8-22 mins                    G Codes:      Fredrich BirksRobinette, Tyishia Aune Elizabeth 04/08/2013, 3:02 PM 04/08/2013 Fredrich Birksobinette, Jhovany Weidinger Elizabeth PTA [phone] pager [phone] office

## 2013-04-08 NOTE — Progress Notes (Signed)
I await further plans for intervention and timing with interventional radiology for left internal carotid artery to assist in planning rehab venue final disposition. [phone]

## 2013-04-08 NOTE — Progress Notes (Signed)
Patient not urinating.  Bladder scan revealed 273.  Patient abdomen soft and flat.  Will continue to monitor.  Lance BoschAnna Latavia Goga, RN

## 2013-04-08 NOTE — Progress Notes (Signed)
Attempted stroke education with patient but he has significant cognitive impairments that make it very difficult for him to process information.  Lance BoschAnna Alydia Gosser, RN

## 2013-04-08 NOTE — Progress Notes (Signed)
Subjective:  Appreciate interventional radiology consult and help. Patient more awake and alert today. Right upper extremity weakness improved. Although patient had bilateral BKA but walks to my office with prosthesis.   Objective:  Vital Signs in the last 24 hours: Temp:  [97.1 F (36.2 C)-98.1 F (36.7 C)] 98.1 F (36.7 C) (03/24 0438) Pulse Rate:  [59-89] 69 (03/24 0438) Resp:  [10-21] 18 (03/24 0438) BP: (119-185)/(62-92) 160/90 mmHg (03/24 0438) SpO2:  [94 %-100 %] 94 % (03/24 0438)  Intake/Output from previous day: 03/23 0701 - 03/24 0700 In: -  Out: 650 [Urine:650] Intake/Output from this shift: Total I/O In: 120 [P.O.:120] Out: -   Physical Exam: Neck: no adenopathy, no carotid bruit, no JVD and supple, symmetrical, trachea midline Lungs: clear to auscultation bilaterally Heart: regular rate and rhythm, S1, S2 normal, no murmur, click, rub or gallop Abdomen: soft, non-tender; bowel sounds normal; no masses,  no organomegaly Extremities: Bilateral BKA  Lab Results:  Recent Labs  04/07/13 0435  WBC 6.8  HGB 12.2*  PLT 240    Recent Labs  04/07/13 0435  NA 140  K 4.7  CL 104  CO2 20  GLUCOSE 111*  BUN 16  CREATININE 1.02   No results found for this basename: TROPONINI, CK, MB,  in the last 72 hours Hepatic Function Panel No results found for this basename: PROT, ALBUMIN, AST, ALT, ALKPHOS, BILITOT, BILIDIR, IBILI,  in the last 72 hours No results found for this basename: CHOL,  in the last 72 hours No results found for this basename: PROTIME,  in the last 72 hours  Imaging: Imaging results have been reviewed and No results found.  Cardiac Studies:  Assessment/Plan:  Probable Recurrent CVA versus reversible ischemic neurological deficit Severe cerebrovascular disease  History of CVA in the past status post PTA to left mid cerebral artery in the past  Coronary artery disease history of MI in the past  Ischemic cardiomyopathy  Compensated systolic  heart failure  History of complete heart block status post permanent pacemaker  Hypertension  Non-insulin-dependent diabetes mellitus  Severe peripheral vascular disease status post bilateral below-knee amputation in the past  Hypercholesteremia  Plan Agree with PTA to left internal carotid artery as per interventional radiology. Increase ACE inhibitors as per orders  LOS: 5 days    Shenea Giacobbe N 04/08/2013, 10:11 AM

## 2013-04-08 NOTE — Progress Notes (Addendum)
Stroke Team Progress Note  HISTORY [name] is an 80 y.o. male presenting 04/03/2013 with symptoms of difficulty moving. He stated that it was "all over" and requested to be brought to the emergency room out of concern for new stroke. After arrival he had some improvement, however he had persistent new slurred speech as well as some mild left-sided weakness which he stated was new . Patient was not a TPA candidate secondary to delay and arrival. He was admitted for further stroke evaluation and treatment.   Stroke team was asked by interventional radiology to assess for left ICA stenosis and treatment.  SUBJECTIVE Patient up in chair. No complaints. No family present.  OBJECTIVE Most recent Vital Signs: Filed Vitals:   04/08/13 0220 04/08/13 0346 04/08/13 0438 04/08/13 1028  BP: 185/79 170/80 160/90 138/72  Pulse: 66  69 64  Temp: 97.8 F (36.6 C)  98.1 F (36.7 C) 97.6 F (36.4 C)  TempSrc: Oral  Oral Oral  Resp: 16  18 18   Height:      Weight:      SpO2: 98%  94% 100%   CBG (last 3)   Recent Labs  04/07/13 2148 04/08/13 0657 04/08/13 1149  GLUCAP 115* 99 156*    IV Fluid Intake:   . sodium chloride 20 mL/hr at 04/03/13 2248    MEDICATIONS  . aspirin  81 mg Oral Daily  . atorvastatin  80 mg Oral Daily  . carvedilol  12.5 mg Oral BID WC  . clopidogrel  75 mg Oral Daily  . heparin  5,000 Units Subcutaneous 3 times per day  . insulin aspart  0-9 Units Subcutaneous TID WC  . nitroGLYCERIN  0.4 mg Transdermal Daily  . pantoprazole  40 mg Oral Daily  . ramipril  5 mg Oral BID   PRN:  nitroGLYCERIN, senna-docusate  Diet:  Dysphagia   CLINICALLY SIGNIFICANT STUDIES Basic Metabolic Panel:   Recent Labs Lab 04/03/13 0920 04/03/13 2145 04/07/13 0435  NA 144  --  140  K 5.3  --  4.7  CL 103  --  104  CO2 27  --  20  GLUCOSE 134*  --  111*  BUN 16  --  16  CREATININE 0.96 0.95 1.02  CALCIUM 10.1  --  10.1   Liver Function Tests:   Recent Labs Lab  04/03/13 0920  AST 23  ALT 15  ALKPHOS 116  BILITOT 0.3  PROT 7.1  ALBUMIN 3.3*   CBC:   Recent Labs Lab 04/03/13 0920 04/03/13 2145 04/07/13 0435  WBC 6.3 8.6 6.8  NEUTROABS 4.7  --   --   HGB 11.1* 11.6* 12.2*  HCT 34.1* 34.7* 36.0*  MCV 86.5 85.3 84.5  PLT 240 235 240   Coagulation:   Recent Labs Lab 04/03/13 0920  LABPROT 13.1  INR 1.01   Cardiac Enzymes: No results found for this basename: CKTOTAL, CKMB, CKMBINDEX, TROPONINI,  in the last 168 hours Urinalysis:   Recent Labs Lab 04/03/13 0936  COLORURINE YELLOW  LABSPEC 1.007  PHURINE 6.0  GLUCOSEU NEGATIVE  HGBUR NEGATIVE  BILIRUBINUR NEGATIVE  KETONESUR NEGATIVE  PROTEINUR 30*  UROBILINOGEN 0.2  NITRITE NEGATIVE  LEUKOCYTESUR NEGATIVE   Lipid Panel    Component Value Date/Time   CHOL 125 04/04/2013 0208   TRIG 49 04/04/2013 0208   HDL 52 04/04/2013 0208   CHOLHDL 2.4 04/04/2013 0208   VLDL 10 04/04/2013 0208   LDLCALC 63 04/04/2013 0208   HgbA1C  Lab  Results  Component Value Date   HGBA1C 6.8* 04/04/2013    Urine Drug Screen:     Component Value Date/Time   LABOPIA NONE DETECTED 04/03/2013 0936   COCAINSCRNUR NONE DETECTED 04/03/2013 0936   LABBENZ NONE DETECTED 04/03/2013 0936   AMPHETMU NONE DETECTED 04/03/2013 0936   THCU NONE DETECTED 04/03/2013 0936   LABBARB NONE DETECTED 04/03/2013 0936    Alcohol Level:   Recent Labs Lab 04/03/13 0920  ETH <11    Dg Chest 2 View 04/03/2013    Cardiomegaly without acute disease.      Ct Head Wo Contrast 04/03/2013    Atrophy with mild small vessel disease. Prior small lacunar infarcts in the mid left cerebellum in superior aspect of the head of the caudate nucleus on the right. There is no intracranial mass, hemorrhage, or acute appearing infarct.    CT angiogram of the head 04/04/2013  No identifiable acute infarction. No identifiable acute embolus or embolic occlusion. Severe atherosclerotic disease of the right ICA as it passes through the  skullbase and of both carotid siphon regions and supra clinoid internal carotid arteries. Serial stenoses in those regions of 70% or greater. 70% stenosis at the origin of the right anterior cerebral artery. Severe stenosis (80% or greater) of the main posterior branch of the left MCA just beyond the MCA bifurcation. This would place the patient at risk of left MCA territory infarction.  Pronounced atherosclerotic narrowing and irregularity of the PCA branches bilaterally.  MRI of the brain  Permanent pacemaker  2D Echocardiogram  Ejection fraction 25-30%. No cardiac source of emboli was identified.  Carotid Doppler  Findings suggest 1-39% right internal carotid artery stenosis and 80-99% left internal carotid artery stenosis by velocity. Vertebral arteries are patent with antegrade flow.  EKG  Paced rhythm 72 beats per minute. For complete results please see formal report.   Physical Exam   Frail elderly african [place] male not in distress.Awake alert. Afebrile. Head is nontraumatic. Neck is supple without bruit. Hearing is normal. Cardiac exam no murmur or gallop. Lungs are clear to auscultation. Distal pulses are well felt. He has bilateral above knee amputation. Neurological Exam : Awake alert oriented x2. Severe dysarthria but can be understood. Extraocular moments are full range without nystagmus. Blinks to threat bilaterally. Mild left lower facial weakness. Tongue midline. Weak cough and gag. Moderate left hemiparesis with 3/5 upper and lower extremity strength. Bilateral above knee amputation. Sensation diminished on the left compared to the right. Gait cannot be tested.   ASSESSMENT [name] is a 80 y.o. male presenting with dysarthria and left hemiparesis. TPA was not given secondary to late presentation.. A CT scan showed no acute infarct. The patient cannot have an MRI secondary to pacemaker. On aspirin 81 mg orally every day and clopidogrel 75 mg orally every day prior to  admission. Now on aspirin 81 mg orally every day and clopidogrel 75 mg orally every day for secondary stroke prevention. Patient with resultant dysarthria and left hemiparesis. Stroke work up completed.   Hyperlipidemia - on Lipitor prior to admission  Permanent pacemaker  Coronary artery disease  Previous stroke  Bilateral lower extremity amputations  Hypertension history  Hemoglobin A1c 6.8  CT angiogram of the head negative for acute infarct but positive for significant atherosclerotic disease left middle cerebral artery and right internal carotid artery.  Left ventricular dysfunction - ejection fraction 25-30% by 2-D echo  Asymptomatic severe left ICA stenosis  Hospital day # 5  TREATMENT/PLAN  Continue aspirin 81 mg orally every day and clopidogrel 75 mg orally every day for secondary stroke prevention   Can consider left ICA intervention at the end of rehabilitation if desired, dual antiplatelets until then. Would consider patient's premorbid state prior to proceeding.  Followup Dr. Pearlean Brownie in 2 months.  Stroke team will sign off. Please call for further assistance.  Annie Main, MSN, RN, ANVP-BC, ANP-BC, [facility] Pager: [phone] 04/08/2013 6:51 PM  I have personally obtained a history, examined the patient, evaluated imaging results, and formulated the assessment and plan of care. I agree with the above. Delia Heady, MD  To contact Stroke Continuity provider, please refer to [email or web address]. After hours, contact General Neurology

## 2013-04-08 NOTE — Progress Notes (Signed)
Occupational Therapy Treatment Patient Details Name: [name] MRN: [id number] DOB: September 17, 1931 Today's Date: 04/08/2013    History of present illness Patient is 80 year old male with past medical history significant for multiple medical problems i.e. 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 Patient denies any seizure activities. Denies any headaches in ED his motor strength is improved but continues to have slurred speech. Denies any blurring of vision but states he feels numb all over. Patient denies any palpitation lightheadedness or syncope. Denies any chest pain denies shortness of breath   OT comments  Pt progressing towards goals. Pt performed anterior/posterior transfer to chair today (did not have all parts of prosthesis). Pt performed ADLs during session. Continue to recommend CIR for rehab.   Follow Up Recommendations  CIR;Supervision/Assistance - 24 hour    Equipment Recommendations  Other (comment) (defer to next venue)    Recommendations for Other Services Rehab consult    Precautions / Restrictions Precautions Precautions: Fall Restrictions Weight Bearing Restrictions: No       Mobility Bed Mobility Overal bed mobility: Needs Assistance Bed Mobility: Supine to Sit     Supine to sit: Max assist     General bed mobility comments: cues for  technique/sequencing.  Transfers Overall transfer level: Needs assistance   Transfers: Licensed conveyancer transfers: +2 physical assistance;Max assist   General transfer comment: pt transferred from bed to chair with +2 Total A/Max A. Cues for technique.        ADL   Grooming: Applying deodorant;Wash/dry face;Oral care;Moderate assistance;Sitting         Toilet Transfer: +2 for physical assistance;Maximal assistance;Anterior/posterior (from bed to chair)       General ADL Comments: Pt sat EOB for at least five minutes and washed face and brushed teeth. OT assisted in positioning items in hands so pt could better manipulate/open them. Pt sat in chair and applied deodorant with assistance. No physical assistance needed for balance sitting EOB.                Cognition   Behavior During Therapy: WFL for tasks assessed/performed Overall Cognitive Status: No family/caregiver present to determine baseline cognitive functioning Area of Impairment: Orientation;Problem solving Orientation Level: Disoriented to;Place;Situation (said wrong birthday)       Problem Solving: Slow processing;Difficulty sequencing;Requires verbal cues;Requires tactile cues                Pertinent Vitals/ Pain       No apparent distress.          Frequency Min 2X/week     Progress Toward Goals  OT Goals(current goals can now be found in the care plan section)  Progress towards OT goals: Progressing toward goals  Acute Rehab OT Goals Patient Stated Goal: go home OT Goal Formulation: Patient unable to participate in goal setting Time For Goal Achievement: 04/18/13 Potential to Achieve Goals: Good ADL Goals Pt Will Perform Eating: with min assist;sitting (with left hand)  Pt Will Perform Grooming: with min assist;sitting Additional ADL Goal #1: Pt will sit EOB unsupported with min assist  x 5 minutes as a precursor to ADL. Additional ADL Goal #2:  Pt will use R UE to stabilize an object as the L manipulates with min assist. Additional ADL Goal #3: Visual testing will be completed and appropriate goals set accordingly.  Plan Discharge plan remains appropriate    End of Session Equipment Utilized During Treatment: Gait belt  Activity Tolerance Patient tolerated treatment well   Patient Left in chair;with call bell/phone within reach;with chair alarm set   Nurse Communication Mobility status        Time: [id number] OT Time Calculation (min): 17 min  Charges: OT General Charges $OT Visit: 1 Procedure OT Treatments $Self Care/Home Management : 8-22 mins  Earlie RavelingStraub, Tymothy Cass L OTR/L [phone] 04/08/2013, 10:38 AM

## 2013-04-08 NOTE — Progress Notes (Signed)
Pt removed pressure dressing over right femoral site. Site clean and dry. No bleeding or drainage.  RN placed new pressure dressing over site. Will continue to monitor.

## 2013-04-09 LAB — GLUCOSE, CAPILLARY
GLUCOSE-CAPILLARY: 99 mg/dL (ref 70–99)
GLUCOSE-CAPILLARY: 89 mg/dL (ref 70–99)
Glucose-Capillary: 148 mg/dL — ABNORMAL HIGH (ref 70–99)
Glucose-Capillary: 189 mg/dL — ABNORMAL HIGH (ref 70–99)

## 2013-04-09 NOTE — Progress Notes (Signed)
Was informed by tech that patient had a CBG of 167 at 2201, I then gave patient 2 units of Novo log in error. I immediately called Dr Katrinka BlazingHarwanis answering service and he called back shortly after 0000.  He gave me no orders as the patient was not exhibiting any ill effects; however I had CBG done at 0048 and it was 148 and patient was stable. I will continue to monitor.

## 2013-04-09 NOTE — Progress Notes (Signed)
Physical Therapy Treatment Patient Details Name: [name] MRN: [id number] DOB: 1931/09/09 Today's Date: 04/09/2013    History of Present Illness      PT Comments    Patient continues to be limited to anterior posterior transfer with +2 assist due to not having sleeves or socks to don prothesis. Patient able to help minimally with use of L UE with AP transfer. Patient appeared more fatigued this session but did finish with bath as soon as therapist had walked in this session  Follow Up Recommendations  CIR;Supervision/Assistance - 24 hour     Equipment Recommendations       Recommendations for Other Services       Precautions / Restrictions Precautions Precautions: Fall    Mobility  Bed Mobility Overal bed mobility: Needs Assistance Bed Mobility: Supine to Sit     Supine to sit: Max assist     General bed mobility comments: cues for technique/sequencing. A for trunk control and hand placement  Transfers Overall transfer level: Needs assistance           Anterior-Posterior transfers: +2 physical assistance;Max assist   General transfer comment: A for all aspects. Patient unable to push back with R hand but able to assist some with L hand. Cues to keep weight forward  Ambulation/Gait                 Stairs            Wheelchair Mobility    Modified Rankin (Stroke Patients Only)       Balance     Sitting balance-Leahy Scale: Poor Sitting balance - Comments: unable to sit without min support or use UEs effectively to assist balance                            Cognition Arousal/Alertness: Lethargic Behavior During Therapy: WFL for tasks assessed/performed Overall Cognitive Status: No family/caregiver present to determine baseline cognitive functioning Area of Impairment: Orientation;Problem solving Orientation Level: Disoriented to;Place;Situation   Memory: Decreased short-term memory       Problem Solving: Slow  processing;Difficulty sequencing;Requires verbal cues;Requires tactile cues      Exercises      General Comments        Pertinent Vitals/Pain Denied pain    Home Living                      Prior Function            PT Goals (current goals can now be found in the care plan section) Progress towards PT goals: Progressing toward goals    Frequency  Min 4X/week    PT Plan Current plan remains appropriate    End of Session   Activity Tolerance: Patient tolerated treatment well Patient left: in chair;with call bell/phone within reach     Time: [id number] PT Time Calculation (min): 13 min  Charges:  $Therapeutic Activity: 8-22 mins                    G Codes:      Fredrich BirksRobinette, Julia Elizabeth 04/09/2013, 12:46 PM 04/09/2013 Fredrich Birksobinette, Julia Elizabeth PTA [phone] pager [phone] office

## 2013-04-09 NOTE — Progress Notes (Signed)
Subjective:  Patient more awake and awake but speech still slurred.  Objective:  Vital Signs in the last 24 hours: Temp:  [97.3 F (36.3 C)-98.3 F (36.8 C)] 97.7 F (36.5 C) (03/25 1123) Pulse Rate:  [65-78] 65 (03/25 1123) Resp:  [18-20] 18 (03/25 1123) BP: (111-148)/(50-71) 127/64 mmHg (03/25 1123) SpO2:  [94 %-99 %] 96 % (03/25 1123)  Intake/Output from previous day: 03/24 0701 - 03/25 0700 In: 241 [P.O.:241] Out: -  Intake/Output from this shift: Total I/O In: -  Out: 400 [Urine:400]  Physical Exam: Neck: no adenopathy, no carotid bruit, no JVD and supple, symmetrical, trachea midline Lungs: clear to auscultation bilaterally Heart: regular rate and rhythm, S1, S2 normal, no murmur, click, rub or gallop Abdomen: soft, non-tender; bowel sounds normal; no masses,  no organomegaly Extremities: Bilateral BKA  Lab Results:  Recent Labs  04/07/13 0435  WBC 6.8  HGB 12.2*  PLT 240    Recent Labs  04/07/13 0435  NA 140  K 4.7  CL 104  CO2 20  GLUCOSE 111*  BUN 16  CREATININE 1.02   No results found for this basename: TROPONINI, CK, MB,  in the last 72 hours Hepatic Function Panel No results found for this basename: PROT, ALBUMIN, AST, ALT, ALKPHOS, BILITOT, BILIDIR, IBILI,  in the last 72 hours No results found for this basename: CHOL,  in the last 72 hours No results found for this basename: PROTIME,  in the last 72 hours  Imaging: Imaging results have been reviewed and Ir Angio Intra Extracran Sel Com Carotid Innominate Bilat Mod Sed  04/07/2013   CLINICAL DATA:  Left cerebral hemispheric ischemic symptoms with right-sided weakness especially of the right upper extremity.  EXAM: BILATERAL COMMON CAROTID AND INNOMINATE ANGIOGRAPHY AND BILATERAL VERTEBRAL ARTERY ANGIOGRAMS:  ANESTHESIA/SEDATION: Conscious sedation.  MEDICATIONS: Versed 1 mg IV. Fentanyl 25 mcg IV.  CONTRAST:  60 mL OMNIPAQUE IOHEXOL 300 MG/ML  SOLN  PROCEDURE: Following a full explanation of the  procedure along with the potential associated complications, an informed witnessed consent was obtained.  The right groin was prepped and draped in the usual sterile fashion. Thereafter using a modified Seldinger technique, transfemoral access into the right common femoral artery was obtained without difficulty. Over a 0.035 inch guidewire, a 5 French Pinnacle sheath was inserted. Through this, and also over a 0.035 inch guidewire, a 5 French JB1 catheter was advanced to the aortic arch region and selectively positioned in the right common carotid artery, the right vertebral artery, the left common carotid artery and the left subclavian artery. The patient tolerated the procedure well.  COMPLICATIONS: None immediate.  FINDINGS: The right common carotid arteriogram demonstrates the right external carotid artery and its major branches to be widely patent. The right internal carotid artery has a smooth circumferential non calcified plaque without significant stenosis by the NASCET criteria. No acute ulceration is seen. The vessel is seen to ascend to the cranial skull base where there is an approximately 30% stenosis of the distal cervical segment.  The petrous cavernous junction demonstrates approximately 50% stenosis.  Distal to this the cavernous and the supraclinoid segments are widely patent.  The right middle cerebral artery M1 segment demonstrates a 50% stenosis just proximal to the right MCA trifurcation. There is a mild narrowing of the right anterior cerebral artery origin. The right middle cerebral and the right anterior cerebral artery distal branches, otherwise, are seen to opacify into the capillary and venous phases. A few scattered focal areas  of mild arteriosclerotic irregularity is seen in the callosum marginal branches. The right vertebral artery origin is normal.  The vessel opacifies normally to the cranial skull base.  There is opacification of the right posterior-inferior cerebellar artery. The  basilar artery demonstrates mild caliber irregularity in its middle to distal one-third. Opacification of both posterior cerebral arteries is seen.  The right posterior cerebral artery has a 50% stenosis proximally and an approximate 70% stenosis at the P1 P2 junction.  The left posterior cerebral artery has less prominent narrowing proximally.  The distal branches demonstrate mild caliber irregularity. Demonstrated is focal areas of mild caliber irregularity in its entirety consistent with intracranial arteriosclerosis. Opacification of both superior cerebellar arteries and of the anterior inferior cerebellar arteries is seen. There is retrograde opacification of the left vertebrobasilar junction to the left posterior inferior cerebellar artery, with flash opacification of the left posterior inferior cerebellar artery. Antegrade flow is seen of non-opacified blood from the more proximal left vertebral artery.  The left common carotid arteriogram demonstrates mild narrowing at the origin of the left external carotid artery. Its branches are, otherwise, normally opacified.  The left internal carotid artery at the bulb demonstrates a smooth segmental narrowing which on the AP view is seen to be around 85%. The narrowing is closer to 70-75 % on the lateral projection.  The left internal carotid artery distally is seen to opacify normally.  The petrous segment demonstrates mild caliber irregularity. The cavernous segment demonstrates a 50% stenoses secondary to circumferential plaque.  The supraclinoid segment is normal.  The left middle cerebral artery has a 50% stenosis proximally, with a 70% stenosis of the origin of the inferior division of the left middle cerebral artery. The left anterior cerebral artery is patent to its tertiary branches.  Cross opacification via the anterior communicating artery of the right anterior cerebral artery A2 segment is also seen.  The left subclavian arteriogram demonstrates the  origin of the left vertebral artery to be normal.  The vessel opacifies normally to the cranial skull base where there is normal opacification of the left posterior inferior cerebellar artery. This vessel reveals scattered caliber irregularity consistent with intracranial arteriosclerosis. The vertebrobasilar junction is patent with contrast noted in the basilar artery, the posterior cerebral arteries and the superior cerebellar arteries. Significant inflow of non-opacified blood is seen in the basilar artery.  IMPRESSION: Approximately 85% stenosis of the left internal carotid artery proximally on the AP projection, with approximately 70-75% narrowing in the lateral projection.  50% stenosis of the left internal carotid artery cavernous segment, and of the left middle cerebral artery proximally. A 70% stenosis of the inferior division of the left middle cerebral artery.  High-grade stenosis the right posterior cerebral artery P1 P2 segment, with a 50% stenosis proximally.  Approximately 50% stenosis of the right internal carotid artery petrous cavernous junction of the right internal carotid artery. Approximately 50% stenosis of the right middle cerebral artery distal M1 segment.   Electronically Signed   By: Julieanne Cotton M.D.   On: 04/07/2013 14:56    Cardiac Studies:  Assessment/Plan:  Probable Recurrent CVA versus reversible ischemic neurological deficit  Severe cerebrovascular disease  History of CVA in the past status post PTA to left mid cerebral artery in the past  Coronary artery disease history of MI in the past  Ischemic cardiomyopathy  Compensated systolic heart failure  History of complete heart block status post permanent pacemaker  Hypertension  Non-insulin-dependent diabetes mellitus  Severe peripheral  vascular disease status post bilateral below-knee amputation in the past  Hypercholesteremia  Plan Continue present management Awaiting interventional radiology recommendation  regarding timing of PTA  LOS: 6 days    Shalyn Koral N 04/09/2013, 12:13 PM

## 2013-04-10 LAB — GLUCOSE, CAPILLARY
GLUCOSE-CAPILLARY: 92 mg/dL (ref 70–99)
Glucose-Capillary: 176 mg/dL — ABNORMAL HIGH (ref 70–99)
Glucose-Capillary: 117 mg/dL — ABNORMAL HIGH (ref 70–99)
Glucose-Capillary: 152 mg/dL — ABNORMAL HIGH (ref 70–99)

## 2013-04-10 MED ORDER — NIMODIPINE 30 MG PO CAPS
60.0000 mg | ORAL_CAPSULE | ORAL | Status: DC
  Filled 2013-04-10 (×3): qty 2

## 2013-04-10 MED ORDER — SODIUM CHLORIDE 0.9 % IV SOLN
Freq: Once | INTRAVENOUS | Status: AC
  Administered 2013-04-11: 02:00:00 via INTRAVENOUS

## 2013-04-10 MED ORDER — CEFAZOLIN SODIUM-DEXTROSE 2-3 GM-% IV SOLR
2.0000 g | Freq: Once | INTRAVENOUS | Status: AC
  Administered 2013-04-11: 2 g via INTRAVENOUS
  Filled 2013-04-10: qty 50

## 2013-04-10 NOTE — Progress Notes (Signed)
OT Cancellation Note  Patient Details Name: Conchita ParisBilly Joubert MRN: [id number] DOB: [birth year]   Cancelled Treatment:     Cancel/Medical: Acute OT treatment deferred today s/p chart review that indicates pt is w/ plans to move forward with intervention at L ICA and currently being scheduled for this procedure. Will f/u as schedule permits and pt medically able.   Roselie AwkwardBarnhill, Zuley Lutter Beth Dixon 04/10/2013, 9:42 AM

## 2013-04-10 NOTE — H&P (Signed)
[name] ParisBilly [name] is an 80 y.o. male.   Chief Complaint: Hx CVA New CVA sxs 04/06/2013 Slurred speech Rt sided weakness Cerebral arteriogram 3/24 reveals L ICA stenosis pts family was consulted with Dr Corliss Skainseveshwar Now scheduled for L Internal Carotid Artery angioplasty/stent placement 3/27  HPI: HLD; CAD; NIDD; PVD; CVA; HTN; glaucoma  Past Medical History  Diagnosis Date  . Hypercholesteremia     takes Lipitor daily  . CAD (coronary artery disease)     s/p PTCA to PLV branch in 2002  . Non-insulin dependent diabetes mellitus   . PVD (peripheral vascular disease)     s/p right BKA  . CVA (cerebral infarction)     s/p PTA to middle cerebral artery;slurred speech  . HTN (hypertension)     takes Benazepril daily  . Pacemaker   . Shortness of breath     sitting as well as standing  . Glaucoma   . Complete heart block     s/p Medtronic PPM by JA 09/2009  . Stroke     Past Surgical History  Procedure Laterality Date  . Pacemaker insertion  09/2010  . Appendectomy    . Amputation      below amputation  . Cardiac catheterization      2002/2009  . Amputation  08/02/2011    Procedure: AMPUTATION BELOW KNEE;  Surgeon: Larina Earthlyodd F Early, MD;  Location: [facility] OR;  Service: Vascular;  Laterality: Left;  . Insert / replace / remove pacemaker      Family History  Problem Relation Age of Onset  . Heart failure Mother   . Heart failure Father   . Prostate cancer Brother    Social History:  reports that he has never smoked. He has never used smokeless tobacco. He reports that he does not drink alcohol or use illicit drugs.  Allergies: No Known Allergies  Medications Prior to Admission  Medication Sig Dispense Refill  . acetaminophen (TYLENOL) 325 MG tablet Take 325 mg by mouth every 6 (six) hours as needed for mild pain.      Marland Kitchen. aspirin EC 81 MG tablet Take 81 mg by mouth daily.      Marland Kitchen. atorvastatin (LIPITOR) 80 MG tablet Take 80 mg by mouth daily.      . carvedilol (COREG) 6.25 MG tablet Take  6.25 mg by mouth 2 (two) times daily with [name] meal.      . clopidogrel (PLAVIX) 75 MG tablet Take 75 mg by mouth daily.      . furosemide (LASIX) 40 MG tablet Take 40 mg by mouth daily.      Marland Kitchen. ibuprofen (ADVIL,MOTRIN) 200 MG tablet Take 200 mg by mouth daily as needed for mild pain.      . isosorbide mononitrate (IMDUR) 60 MG 24 hr tablet Take 60 mg by mouth daily.      . nitroGLYCERIN (NITROSTAT) 0.4 MG SL tablet Place 0.4 mg under the tongue every 5 (five) minutes as needed for chest pain.      . pantoprazole (PROTONIX) 40 MG tablet Take 40 mg by mouth daily.      . ramipril (ALTACE) 5 MG capsule Take 5 mg by mouth daily.      . traZODone (DESYREL) 100 MG tablet Take 100 mg by mouth at bedtime as needed for sleep.        Results for orders placed during the hospital encounter of 04/03/13 (from the past 48 hour(s))  GLUCOSE, CAPILLARY     Status: Abnormal  Collection Time    04/08/13  5:10 PM      Result Value Ref Range   Glucose-Capillary 110 (*) 70 - 99 mg/dL  GLUCOSE, CAPILLARY     Status: Abnormal   Collection Time    04/08/13  8:54 PM      Result Value Ref Range   Glucose-Capillary 167 (*) 70 - 99 mg/dL   Comment 1 Documented in Chart     Comment 2 Notify RN    GLUCOSE, CAPILLARY     Status: Abnormal   Collection Time    04/09/13 12:45 AM      Result Value Ref Range   Glucose-Capillary 148 (*) 70 - 99 mg/dL   Comment 1 Documented in Chart     Comment 2 Notify RN    GLUCOSE, CAPILLARY     Status: None   Collection Time    04/09/13  7:36 AM      Result Value Ref Range   Glucose-Capillary 99  70 - 99 mg/dL   Comment 1 Notify RN     Comment 2 Documented in Chart    GLUCOSE, CAPILLARY     Status: Abnormal   Collection Time    04/09/13 12:09 PM      Result Value Ref Range   Glucose-Capillary 189 (*) 70 - 99 mg/dL   Comment 1 Documented in Chart    GLUCOSE, CAPILLARY     Status: None   Collection Time    04/09/13  4:54 PM      Result Value Ref Range   Glucose-Capillary  89  70 - 99 mg/dL   Comment 1 Documented in Chart    GLUCOSE, CAPILLARY     Status: None   Collection Time    04/10/13  7:27 AM      Result Value Ref Range   Glucose-Capillary 92  70 - 99 mg/dL  GLUCOSE, CAPILLARY     Status: Abnormal   Collection Time    04/10/13 12:46 PM      Result Value Ref Range   Glucose-Capillary 176 (*) 70 - 99 mg/dL   No results found.  Review of Systems  Constitutional: Positive for weight loss. Negative for fever.  Eyes: Negative for blurred vision.  Respiratory: Negative for shortness of breath.   Cardiovascular: Negative for chest pain.  Gastrointestinal: Negative for nausea and vomiting.  Neurological: Positive for dizziness, speech change and weakness.    Blood pressure 167/69, pulse 65, temperature 98 F (36.7 C), temperature source Oral, resp. rate 16, height 5' (1.524 m), weight 72.576 kg (160 lb), SpO2 93.00%. Physical Exam  Constitutional: He appears well-nourished.  Cardiovascular: Normal rate, regular rhythm and normal heart sounds.   No murmur heard. Respiratory: Effort normal and breath sounds normal. He has no wheezes.  GI: Soft. Bowel sounds are normal. There is no tenderness.  Musculoskeletal: Normal range of motion.  Moves all 4s; B BKA  Neurological: He is alert.  Mild confusion  Skin: Skin is warm and dry.  Psychiatric:  RN consented son over phone per Dr Corliss Skains     Assessment/Plan L ICA stenosis per arteriogram 3/24 Now scheduled for L ICA pta/stent in IR 3/27- see orders pts son and family are aware of procedure benefits and risks and agreeable to proceed Consent signed and in chart   [name] 04/10/2013, 2:29 PM

## 2013-04-10 NOTE — Progress Notes (Signed)
Subjective:  Patient denies any chest pain or shortness of breath. Slurred speech slightly improved but persist. Right upper extremity motor strength 4+. Patient schedule for PTA/stenting to left ICA tomorrow. Patient has been on dual antiplatelet for quite some time.  Objective:  Vital Signs in the last 24 hours: Temp:  [97.9 F (36.6 C)-98.5 F (36.9 C)] 98 F (36.7 C) (03/26 0616) Pulse Rate:  [56-72] 65 (03/26 0616) Resp:  [16-20] 16 (03/26 0616) BP: (134-167)/(69-98) 167/69 mmHg (03/26 0616) SpO2:  [93 %-100 %] 93 % (03/26 0616)  Intake/Output from previous day: 03/25 0701 - 03/26 0700 In: 2192.5 [I.V.:2192.5] Out: 2320 [Urine:2320] Intake/Output from this shift:    Physical Exam: Neck: no adenopathy, no carotid bruit, no JVD and supple, symmetrical, trachea midline Lungs: clear to auscultation bilaterally Heart: regular rate and rhythm, S1, S2 normal, no murmur, click, rub or gallop Abdomen: soft, non-tender; bowel sounds normal; no masses,  no organomegaly Extremities: Status post bilateral BKA  Lab Results: No results found for this basename: WBC, HGB, PLT,  in the last 72 hours No results found for this basename: NA, K, CL, CO2, GLUCOSE, BUN, CREATININE,  in the last 72 hours No results found for this basename: TROPONINI, CK, MB,  in the last 72 hours Hepatic Function Panel No results found for this basename: PROT, ALBUMIN, AST, ALT, ALKPHOS, BILITOT, BILIDIR, IBILI,  in the last 72 hours No results found for this basename: CHOL,  in the last 72 hours No results found for this basename: PROTIME,  in the last 72 hours  Imaging: Imaging results have been reviewed and No results found.  Cardiac Studies:  Assessment/Plan:  Probable Recurrent CVA versus reversible ischemic neurological deficit  Severe cerebrovascular disease status post four-vessel cerebral angiogram History of CVA in the past status post PTA to left mid cerebral artery in the past  Coronary artery  disease history of MI in the past  Ischemic cardiomyopathy  Compensated systolic heart failure  History of complete heart block status post permanent pacemaker  Hypertension  Non-insulin-dependent diabetes mellitus  Severe peripheral vascular disease status post bilateral below-knee amputation in the past  Hypercholesteremia  Plan Continue present management Schedule for PTA/stenting to left ICA tomorrow. Discuss with family understands risks and benefits and wants to proceed.  LOS: 7 days    [name] N 04/10/2013, 11:43 AM

## 2013-04-10 NOTE — Progress Notes (Signed)
Patient ID: [name], male   DOB: [birth year], 80 y.o.   MRN: [id number]  Dr Corliss Skainseveshwar has discussed case with Dr Sharyn LullHarwani Plan to move forward with intervention at L ICA  I have spoken to son [name] --they do consent to move forward  Scheduler still working on time for procedure with anesthesia We will inform pt, family and MD asap

## 2013-04-10 NOTE — Progress Notes (Signed)
Patient ID: [name], male   DOB: 12-May-1931, 80 y.o.   MRN: [id number]   Pt now scheduled for L ICA angioplasty/stent placement in IR 3/27 See orders IR will see pt this afternoon

## 2013-04-10 NOTE — Progress Notes (Signed)
Pt more SNF level rehab at this time. I alerted RN CM and SW at LOS meeting. He will need prolonged rehab recovery at this time. Please call me for any questions. [phone]

## 2013-04-10 NOTE — Progress Notes (Signed)
Spoke with Dr. Drusilla Kannereveshawar and got information to obtain consent from patient's son over the phone.  Consent has been signed and placed in chart.  Will continue to monitor patient.

## 2013-04-11 ENCOUNTER — Encounter (HOSPITAL_COMMUNITY)
Admission: EM | Disposition: A | Discharge status: Skilled Nursing Facility | Payer: Self-pay | Source: Home / Self Care | Attending: Cardiology

## 2013-04-11 ENCOUNTER — Inpatient Hospital Stay (HOSPITAL_COMMUNITY): Payer: Medicare Other | Admitting: Anesthesiology

## 2013-04-11 ENCOUNTER — Encounter (HOSPITAL_COMMUNITY): Payer: Medicare Other | Admitting: Anesthesiology

## 2013-04-11 ENCOUNTER — Encounter (HOSPITAL_COMMUNITY): Payer: Self-pay | Admitting: Anesthesiology

## 2013-04-11 ENCOUNTER — Inpatient Hospital Stay (HOSPITAL_COMMUNITY): Payer: Medicare Other

## 2013-04-11 HISTORY — PX: RADIOLOGY WITH ANESTHESIA: SHX6223

## 2013-04-11 LAB — CBC WITH DIFFERENTIAL/PLATELET
Basophils Absolute: 0 10*3/uL (ref 0.0–0.1)
Basophils Relative: 1 % (ref 0–1)
EOS ABS: 0.2 10*3/uL (ref 0.0–0.7)
EOS PCT: 3 % (ref 0–5)
HEMATOCRIT: 36.5 % — AB (ref 39.0–52.0)
Hemoglobin: 12.2 g/dL — ABNORMAL LOW (ref 13.0–17.0)
LYMPHS ABS: 1.4 10*3/uL (ref 0.7–4.0)
LYMPHS PCT: 24 % (ref 12–46)
MCH: 28.4 pg (ref 26.0–34.0)
MCHC: 33.4 g/dL (ref 30.0–36.0)
MCV: 84.9 fL (ref 78.0–100.0)
Monocytes Absolute: 0.6 10*3/uL (ref 0.1–1.0)
Monocytes Relative: 9 % (ref 3–12)
Neutro Abs: 3.9 10*3/uL (ref 1.7–7.7)
Neutrophils Relative %: 64 % (ref 43–77)
Platelets: 264 10*3/uL (ref 150–400)
RBC: 4.3 MIL/uL (ref 4.22–5.81)
RDW: 16.1 % — AB (ref 11.5–15.5)
WBC: 6.1 10*3/uL (ref 4.0–10.5)

## 2013-04-11 LAB — COMPREHENSIVE METABOLIC PANEL
ALK PHOS: 136 U/L — AB (ref 39–117)
ALT: 30 U/L (ref 0–53)
AST: 27 U/L (ref 0–37)
Albumin: 3.3 g/dL — ABNORMAL LOW (ref 3.5–5.2)
BUN: 17 mg/dL (ref 6–23)
CO2: 23 meq/L (ref 19–32)
Calcium: 10.1 mg/dL (ref 8.4–10.5)
Chloride: 105 mEq/L (ref 96–112)
Creatinine, Ser: 0.94 mg/dL (ref 0.50–1.35)
GFR, EST AFRICAN AMERICAN: 89 mL/min — AB (ref >90)
GFR, EST NON AFRICAN AMERICAN: 77 mL/min — AB (ref >90)
GLUCOSE: 104 mg/dL — AB (ref 70–99)
POTASSIUM: 4.1 meq/L (ref 3.7–5.3)
SODIUM: 140 meq/L (ref 137–147)
Total Bilirubin: 0.3 mg/dL (ref 0.3–1.2)
Total Protein: 7.3 g/dL (ref 6.0–8.3)

## 2013-04-11 LAB — APTT: aPTT: 29 seconds (ref 24–37)

## 2013-04-11 LAB — CBC
HCT: 28 % — ABNORMAL LOW (ref 39.0–52.0)
Hemoglobin: 9.4 g/dL — ABNORMAL LOW (ref 13.0–17.0)
MCH: 28.4 pg (ref 26.0–34.0)
MCHC: 33.6 g/dL (ref 30.0–36.0)
MCV: 84.6 fL (ref 78.0–100.0)
Platelets: 232 K/uL (ref 150–400)
RBC: 3.31 MIL/uL — ABNORMAL LOW (ref 4.22–5.81)
RDW: 16.1 % — ABNORMAL HIGH (ref 11.5–15.5)
WBC: 4.9 K/uL (ref 4.0–10.5)

## 2013-04-11 LAB — PROTIME-INR
INR: 1.03 (ref 0.00–1.49)
Prothrombin Time: 13.3 seconds (ref 11.6–15.2)

## 2013-04-11 LAB — POCT I-STAT 4, (NA,K, GLUC, HGB,HCT)
GLUCOSE: 126 mg/dL — AB (ref 70–99)
HCT: 24 % — ABNORMAL LOW (ref 39.0–52.0)
Hemoglobin: 8.2 g/dL — ABNORMAL LOW (ref 13.0–17.0)
POTASSIUM: 3.5 meq/L — AB (ref 3.7–5.3)
Sodium: 144 mEq/L (ref 137–147)

## 2013-04-11 LAB — GLUCOSE, CAPILLARY
Glucose-Capillary: 107 mg/dL — ABNORMAL HIGH (ref 70–99)
Glucose-Capillary: 144 mg/dL — ABNORMAL HIGH (ref 70–99)
Glucose-Capillary: 111 mg/dL — ABNORMAL HIGH (ref 70–99)
Glucose-Capillary: 109 mg/dL — ABNORMAL HIGH (ref 70–99)

## 2013-04-11 LAB — PLATELET INHIBITION P2Y12: Platelet Function  P2Y12: 159 [PRU] — ABNORMAL LOW (ref 194–418)

## 2013-04-11 LAB — PREPARE RBC (CROSSMATCH)

## 2013-04-11 LAB — POCT ACTIVATED CLOTTING TIME
ACTIVATED CLOTTING TIME: 171 s
Activated Clotting Time: 160 s

## 2013-04-11 LAB — HEPARIN LEVEL (UNFRACTIONATED): Heparin Unfractionated: 0.18 IU/mL — ABNORMAL LOW (ref 0.30–0.70)

## 2013-04-11 SURGERY — RADIOLOGY WITH ANESTHESIA
Anesthesia: General

## 2013-04-11 MED ORDER — HEPARIN (PORCINE) IN NACL 100-0.45 UNIT/ML-% IJ SOLN
550.0000 [IU]/h | INTRAMUSCULAR | Status: DC
  Administered 2013-04-11: 550 [IU]/h via INTRAVENOUS
  Filled 2013-04-11: qty 250

## 2013-04-11 MED ORDER — PHENYLEPHRINE HCL 10 MG/ML IJ SOLN
INTRAMUSCULAR | Status: DC | PRN
  Administered 2013-04-11: 40 ug via INTRAVENOUS
  Administered 2013-04-11 (×2): 120 ug via INTRAVENOUS
  Administered 2013-04-11: 80 ug via INTRAVENOUS

## 2013-04-11 MED ORDER — LACTATED RINGERS IV SOLN
INTRAVENOUS | Status: DC | PRN
  Administered 2013-04-11 (×2): via INTRAVENOUS

## 2013-04-11 MED ORDER — FENTANYL CITRATE 0.05 MG/ML IJ SOLN
INTRAMUSCULAR | Status: DC | PRN
  Administered 2013-04-11: 50 ug via INTRAVENOUS

## 2013-04-11 MED ORDER — EPTIFIBATIDE 2 MG/ML IV SOLN
INTRAVENOUS | Status: AC
  Filled 2013-04-11: qty 10

## 2013-04-11 MED ORDER — HEPARIN (PORCINE) IN NACL 100-0.45 UNIT/ML-% IJ SOLN
500.0000 [IU]/h | INTRAMUSCULAR | Status: DC
  Filled 2013-04-11: qty 250

## 2013-04-11 MED ORDER — ACETAMINOPHEN 500 MG PO TABS: 1000.0000 mg | ORAL_TABLET | Freq: Four times a day (QID) | ORAL | Status: DC | PRN

## 2013-04-11 MED ORDER — NICARDIPINE HCL IN NACL 20-0.86 MG/200ML-% IV SOLN
5.0000 mg/h | INTRAVENOUS | Status: DC
  Administered 2013-04-11 – 2013-04-12 (×2): 5 mg/h via INTRAVENOUS
  Filled 2013-04-11 (×3): qty 200

## 2013-04-11 MED ORDER — LABETALOL HCL 5 MG/ML IV SOLN
INTRAVENOUS | Status: DC | PRN
  Administered 2013-04-11: 2.5 mg via INTRAVENOUS

## 2013-04-11 MED ORDER — ACETAMINOPHEN 650 MG RE SUPP: 650.0000 mg | Freq: Four times a day (QID) | RECTAL | Status: DC | PRN

## 2013-04-11 MED ORDER — CLOPIDOGREL BISULFATE 75 MG PO TABS: 75.0000 mg | ORAL_TABLET | Freq: Every day | ORAL | Status: DC

## 2013-04-11 MED ORDER — CEFAZOLIN SODIUM-DEXTROSE 2-3 GM-% IV SOLR
INTRAVENOUS | Status: AC
  Administered 2013-04-11: 2 g via INTRAVENOUS
  Filled 2013-04-11: qty 50

## 2013-04-11 MED ORDER — NITROGLYCERIN 1 MG/10 ML FOR IR/CATH LAB
INTRA_ARTERIAL | Status: AC
  Filled 2013-04-11: qty 10

## 2013-04-11 MED ORDER — LACTATED RINGERS IV SOLN
INTRAVENOUS | Status: DC | PRN
  Administered 2013-04-11: 08:00:00 via INTRAVENOUS

## 2013-04-11 MED ORDER — ONDANSETRON HCL 4 MG/2ML IJ SOLN: 4.0000 mg | Freq: Four times a day (QID) | INTRAMUSCULAR | Status: DC | PRN

## 2013-04-11 MED ORDER — SODIUM CHLORIDE 0.9 % IV SOLN
INTRAVENOUS | Status: DC
  Administered 2013-04-11 – 2013-04-14 (×4): via INTRAVENOUS

## 2013-04-11 MED ORDER — HEPARIN SODIUM (PORCINE) 1000 UNIT/ML IJ SOLN
INTRAMUSCULAR | Status: DC | PRN
  Administered 2013-04-11 (×2): 500 [IU] via INTRAVENOUS
  Administered 2013-04-11: 3000 [IU] via INTRAVENOUS

## 2013-04-11 MED ORDER — ASPIRIN 325 MG PO TABS
325.0000 mg | ORAL_TABLET | Freq: Every day | ORAL | Status: DC
  Administered 2013-04-12 – 2013-04-15 (×4): 325 mg via ORAL
  Filled 2013-04-11 (×6): qty 1

## 2013-04-11 MED ORDER — IOHEXOL 300 MG/ML  SOLN
150.0000 mL | Freq: Once | INTRAMUSCULAR | Status: AC | PRN
  Administered 2013-04-11: 80 mL via INTRAVENOUS

## 2013-04-11 NOTE — Progress Notes (Signed)
Pharmacy Consult - Heparin  Heparin level = 0.18 (therapeutic) S/p left carotid stent (Dr. Corliss Skainseveshwar) No bleeding noted  Plan: 1) Continue heparin at 550 units / hr 2) Follow up for heparin dc  Thank you. Okey RegalLisa Bracken Moffa, PharmD

## 2013-04-11 NOTE — Progress Notes (Signed)
ANTICOAGULATION CONSULT NOTE - Initial Consult  Pharmacy Consult for Heparin Indication: post-neuro procedure  No Known Allergies  Patient Measurements: Height: 5' (152.4 cm) (patient bilateral below knee amputee) Weight: 160 lb (72.576 kg) IBW/kg (Calculated) : 50 Heparin Dosing Weight: ~66 kg  Vital Signs: Temp: 97 F (36.1 C) (03/27 0157) Temp src: Oral (03/27 0157) BP: 160/70 mmHg (03/27 0700) Pulse Rate: 62 (03/27 0157)  Labs:  Recent Labs  04/11/13 0555 04/11/13 1050  HGB 12.2* 9.4*  HCT 36.5* 28.0*  PLT 264 232  APTT 29  --   LABPROT 13.3  --   INR 1.03  --   CREATININE 0.94  --     Estimated Creatinine Clearance: 52.3 ml/min (by C-G formula based on Cr of 0.94).   Medical History: Past Medical History  Diagnosis Date  . Hypercholesteremia     takes Lipitor daily  . CAD (coronary artery disease)     s/p PTCA to PLV branch in 2002  . Non-insulin dependent diabetes mellitus   . PVD (peripheral vascular disease)     s/p right BKA  . CVA (cerebral infarction)     s/p PTA to middle cerebral artery;slurred speech  . HTN (hypertension)     takes Benazepril daily  . Pacemaker   . Shortness of breath     sitting as well as standing  . Glaucoma   . Complete heart block     s/p Medtronic PPM by JA 09/2009  . Stroke     Medications:  Scheduled:  . aspirin  81 mg Oral Daily  . atorvastatin  80 mg Oral Daily  . carvedilol  12.5 mg Oral BID WC  . clopidogrel  75 mg Oral Daily  . eptifibatide      . insulin aspart  0-9 Units Subcutaneous TID WC  . niMODipine  60 mg Oral 60 min Pre-Op  . nitroGLYCERIN  0.4 mg Transdermal Daily  . nitroGLYCERIN      . pantoprazole  40 mg Oral Daily  . ramipril  5 mg Oral BID    Assessment: 80 yo male s/p carotid stent placement.  Pharmacy asked to begin IV heparin with goal PTT 50-60.  Will use heparin level goal of 0.1-0.25 per protocol  Goal of Therapy:  Heparin level 0.1-0.25 Monitor platelets by anticoagulation  protocol: Yes   Plan:  1. Start IV heparin at rate of 550 units/hr. 2. Check heparin level in 6 hrs. 3. Daily heparin level and CBC.  Tad MooreJessica Annabell Oconnor, Pharm D, BCPS  Clinical Pharmacist Pager [phone]  04/11/2013 11:32 AM

## 2013-04-11 NOTE — Progress Notes (Signed)
SLP Cancellation Note  Patient Details Name: [name] MRN: [id number] DOB: 12-18-1931   Cancelled treatment:        Pt. Had procedure today and was transferred to [facility] ICU.  Will continue therapy attempts.   [name], [name] 04/11/2013, 4:22 PM

## 2013-04-11 NOTE — Progress Notes (Signed)
Subjective:  The patient underwent PTA/stenting of left internal carotid artery today tolerated procedure well. Patient is alert awake and oriented able to move all extremities.  Objective:  Vital Signs in the last 24 hours: Temp:  [97 F (36.1 C)-98 F (36.7 C)] 97.8 F (36.6 C) (03/27 1140) Pulse Rate:  [59-82] 60 (03/27 1200) Resp:  [12-18] 18 (03/27 1200) BP: (139-180)/(60-89) 157/69 mmHg (03/27 1200) SpO2:  [93 %-100 %] 100 % (03/27 1200)  Intake/Output from previous day:   Intake/Output from this shift: Total I/O In: 1800 [I.V.:1800] Out: 1100 [Urine:500; Blood:600]  Physical Exam: Neck: no adenopathy, no carotid bruit, no JVD and supple, symmetrical, trachea midline Lungs: clear to auscultation bilaterally Heart: regular rate and rhythm, S1, S2 normal, no murmur, click, rub or gallop Abdomen: soft, non-tender; bowel sounds normal; no masses,  no organomegaly Extremities: right groin stable. Status post bilateral BKA  Lab Results:  Recent Labs  04/11/13 0555 04/11/13 1038 04/11/13 1050  WBC 6.1  --  4.9  HGB 12.2* 8.2* 9.4*  PLT 264  --  232    Recent Labs  04/11/13 0555 04/11/13 1038  NA 140 144  K 4.1 3.5*  CL 105  --   CO2 23  --   GLUCOSE 104* 126*  BUN 17  --   CREATININE 0.94  --    No results found for this basename: TROPONINI, CK, MB,  in the last 72 hours Hepatic Function Panel  Recent Labs  04/11/13 0555  PROT 7.3  ALBUMIN 3.3*  AST 27  ALT 30  ALKPHOS 136*  BILITOT 0.3   No results found for this basename: CHOL,  in the last 72 hours No results found for this basename: PROTIME,  in the last 72 hours  Imaging: Imaging results have been reviewed and No results found.  Cardiac Studies:  Assessment/Plan:  Probable Recurrent CVA versus reversible ischemic neurological deficit  Severe cerebrovascular disease status post four-vessel cerebral angiogram status post  PTA/stenting of left internal carotid artery History of CVA in the  past status post PTA to left mid cerebral artery in the past  Coronary artery disease history of MI in the past  Ischemic cardiomyopathy  Compensated systolic heart failure  History of complete heart block status post permanent pacemaker  Hypertension  Non-insulin-dependent diabetes mellitus  Severe peripheral vascular disease status post bilateral below-knee amputation in the past  Hypercholesteremia  Plan Restart all his meds prior to PTA stenting. Heparin and Cardene drip as per neuro interventional radiology Dr. Algie CofferKadakia on-call for weekend   LOS: 8 days    [facility] Surgery [facility] LLCARWANI,Justiss Gerbino N 04/11/2013, 12:41 PM

## 2013-04-11 NOTE — Preoperative (Signed)
Beta Blockers   Reason not to administer Beta Blockers:Not Applicable 

## 2013-04-11 NOTE — Anesthesia Preprocedure Evaluation (Addendum)
Anesthesia Evaluation  Patient identified by MRN, date of birth, ID band Patient awake and Patient confused    Reviewed: Allergy & Precautions, H&P , NPO status , Patient's Chart, lab work & pertinent test results, reviewed documented beta blocker date and time   History of Anesthesia Complications Negative for: history of anesthetic complications  Airway Mallampati: III TM Distance: >3 FB Neck ROM: Full    Dental  (+) Edentulous Upper, Edentulous Lower   Pulmonary shortness of breath,  breath sounds clear to auscultation        Cardiovascular hypertension, Pt. on medications and Pt. on home beta blockers + CAD, + Past MI and + Peripheral Vascular Disease + dysrhythmias + pacemaker Rhythm:Regular  3rd degree heart block with medtronic pacer, CHF with EF 25% to 30%. Diffuse hypokinesis. Mild MR    Neuro/Psych S/p cva with slurred speech and left upper extremity weakness CVA, Residual Symptoms    GI/Hepatic Neg liver ROS,   Endo/Other  diabetes, Type 2  Renal/GU      Musculoskeletal   Abdominal   Peds  Hematology  (+) anemia ,   Anesthesia Other Findings   Reproductive/Obstetrics                          Anesthesia Physical Anesthesia Plan  ASA: III  Anesthesia Plan:    Post-op Pain Management:    Induction:   Airway Management Planned:   Additional Equipment:   Intra-op Plan:   Post-operative Plan:   Informed Consent: I have reviewed the patients History and Physical, chart, labs and discussed the procedure including the risks, benefits and alternatives for the proposed anesthesia with the patient or authorized representative who has indicated his/her understanding and acceptance.     Plan Discussed with: CRNA and Surgeon  Anesthesia Plan Comments:        Anesthesia Quick Evaluation

## 2013-04-11 NOTE — Progress Notes (Signed)
OT Cancellation Note  Patient Details Name: [name] MRN: [id number] DOB: September 30, 1931   Cancelled Treatment:    Reason Eval/Treat Not Completed: Patient at procedure or test/ unavailable (Pt receiving ICA stent.) Will continue to follow.  Evern BioMayberry, Natisha Trzcinski Lynn 04/11/2013, 9:09 AM

## 2013-04-11 NOTE — Procedures (Signed)
S/p lt common caritid arteriogram followed by stent assisted angioplasty of symptomatic lt ica proximal stenosis

## 2013-04-11 NOTE — Progress Notes (Signed)
Report called and given to SaxapahawSidney (RN0 on 64M.

## 2013-04-11 NOTE — Transfer of Care (Signed)
Immediate Anesthesia Transfer of Care Note  Patient: [name]  Procedure(s) Performed: Procedure(s) with comments: RADIOLOGY WITH ANESTHESIA (N/A) - LEFT CAROTID STENT  Patient Location: PACU  Anesthesia Type:MAC  Level of Consciousness: awake and alert   Airway & Oxygen Therapy: Patient Spontanous Breathing and Patient connected to nasal cannula oxygen  Post-op Assessment: Report given to PACU RN, Post -op Vital signs reviewed and stable and Patient moving all extremities X 4  Post vital signs: Reviewed and stable  Complications: No apparent anesthesia complications

## 2013-04-11 NOTE — Progress Notes (Signed)
PT Cancellation Note  Patient Details Name: [name] MRN: [id number] DOB: [birth year]   Cancelled Treatment:    Reason Eval/Treat Not Completed: Patient at procedure or test/unavailable.  Patient underwent stenting ICA today.  Will hold PT today.  Will return at later time.   Vena AustriaDavis, Ahlani Wickes H 04/11/2013, 1:35 PM Durenda HurtSusan H. Renaldo Fiddleravis, PT, [facility] Acute Rehab Services Pager [phone]

## 2013-04-11 NOTE — Anesthesia Postprocedure Evaluation (Signed)
  Anesthesia Post-op Note  Patient: [name]  Procedure(s) Performed: Procedure(s) with comments: RADIOLOGY WITH ANESTHESIA (N/A) - LEFT CAROTID STENT  Patient Location: PACU  Anesthesia Type:MAC  Level of Consciousness: awake and alert   Airway and Oxygen Therapy: Patient Spontanous Breathing and Patient connected to nasal cannula oxygen  Post-op Pain: none  Post-op Assessment: Post-op Vital signs reviewed, Patient's Cardiovascular Status Stable, Respiratory Function Stable, Patent Airway, No signs of Nausea or vomiting and Pain level controlled  Post-op Vital Signs: Reviewed and stable  Complications: No apparent anesthesia complications

## 2013-04-12 LAB — BASIC METABOLIC PANEL
BUN: 13 mg/dL (ref 6–23)
CHLORIDE: 105 meq/L (ref 96–112)
CO2: 23 mEq/L (ref 19–32)
Calcium: 9.3 mg/dL (ref 8.4–10.5)
Creatinine, Ser: 0.92 mg/dL (ref 0.50–1.35)
GFR calc Af Amer: 90 mL/min — ABNORMAL LOW (ref >90)
GFR, EST NON AFRICAN AMERICAN: 78 mL/min — AB (ref >90)
Glucose, Bld: 93 mg/dL (ref 70–99)
Potassium: 4.1 mEq/L (ref 3.7–5.3)
SODIUM: 140 meq/L (ref 137–147)

## 2013-04-12 LAB — CBC WITH DIFFERENTIAL/PLATELET
BASOS ABS: 0 10*3/uL (ref 0.0–0.1)
BASOS PCT: 0 % (ref 0–1)
EOS ABS: 0.1 10*3/uL (ref 0.0–0.7)
Eosinophils Relative: 2 % (ref 0–5)
HCT: 26.7 % — ABNORMAL LOW (ref 39.0–52.0)
Hemoglobin: 8.8 g/dL — ABNORMAL LOW (ref 13.0–17.0)
Lymphocytes Relative: 19 % (ref 12–46)
Lymphs Abs: 1.4 10*3/uL (ref 0.7–4.0)
MCH: 28.2 pg (ref 26.0–34.0)
MCHC: 33 g/dL (ref 30.0–36.0)
MCV: 85.6 fL (ref 78.0–100.0)
Monocytes Absolute: 0.4 10*3/uL (ref 0.1–1.0)
Monocytes Relative: 6 % (ref 3–12)
NEUTROS ABS: 5.6 10*3/uL (ref 1.7–7.7)
NEUTROS PCT: 74 % (ref 43–77)
PLATELETS: 223 10*3/uL (ref 150–400)
RBC: 3.12 MIL/uL — ABNORMAL LOW (ref 4.22–5.81)
RDW: 16.2 % — ABNORMAL HIGH (ref 11.5–15.5)
WBC: 7.6 10*3/uL (ref 4.0–10.5)

## 2013-04-12 LAB — GLUCOSE, CAPILLARY
GLUCOSE-CAPILLARY: 118 mg/dL — AB (ref 70–99)
GLUCOSE-CAPILLARY: 116 mg/dL — AB (ref 70–99)
Glucose-Capillary: 93 mg/dL (ref 70–99)
Glucose-Capillary: 123 mg/dL — ABNORMAL HIGH (ref 70–99)

## 2013-04-12 LAB — HEPARIN LEVEL (UNFRACTIONATED): HEPARIN UNFRACTIONATED: 0.14 [IU]/mL — AB (ref 0.30–0.70)

## 2013-04-12 NOTE — Progress Notes (Signed)
Subjective: Pt seen in Neuro ICU. RN reports pt doing fairly well. He has baseline confusion. He denies c/o pain, headache, N/V, dizziness. He is hungry and wants to go home.  Objective: Physical Exam: BP 129/68  Pulse 83  Temp(Src) 98.4 F (36.9 C) (Oral)  Resp 26  Ht 5' (1.524 m)  Wt 163 lb 12.8 oz (74.3 kg)  BMI 31.99 kg/m2  SpO2 100% Gen: nontoxic appearing. He is confused but attempts to follow all commands and answer questions, though responses are not always appropriate BPs stable, cardene weaning down. EOMI, PERRLA Neck supple. Face symmetric, tongue midline Ext: mild (R)drift, no change from pre-op exam Grip strength equal, coordination difficult to assess as pt can't follow commands appropriately. (R)groin site clean, dry, soft, no hematoma  Labs: CBC  Recent Labs  04/11/13 1050 04/12/13 0340  WBC 4.9 7.6  HGB 9.4* 8.8*  HCT 28.0* 26.7*  PLT 232 223   BMET  Recent Labs  04/11/13 0555 04/11/13 1038 04/12/13 0340  NA 140 144 140  K 4.1 3.5* 4.1  CL 105  --  105  CO2 23  --  23  GLUCOSE 104* 126* 93  BUN 17  --  13  CREATININE 0.94  --  0.92  CALCIUM 10.1  --  9.3   LFT  Recent Labs  04/11/13 0555  PROT 7.3  ALBUMIN 3.3*  AST 27  ALT 30  ALKPHOS 136*  BILITOT 0.3   PT/INR  Recent Labs  04/11/13 0555  LABPROT 13.3  INR 1.03     Studies/Results: No results found.  Assessment/Plan:  S/p lt common caritid arteriogram followed by stent assisted angioplasty of symptomatic lt ica proximal stenosis   DC heparin. Pt has received ASA and Plavix as ordered. Can advance diet. Per Dr. Corliss Skainseveshwar, he can be discharged if otherwise medically stable. Will need to stay on ASA 81mg  and Plavix 75mg  daily. Will arrange for follow up appt in 2 weeks as outpt.    LOS: 9 days    Brayton ElBRUNING, Render Marley PA-C 04/12/2013 9:16 AM

## 2013-04-12 NOTE — Progress Notes (Signed)
Pt very confused, attempting to get oob. Multiple attempts to calm and reorient pt. Son, [name], notified. States he is "on the way" to visit patient. Consulting civil engineerCharge RN notified. Plan to move pt to camera room as soon as room available.

## 2013-04-12 NOTE — Progress Notes (Addendum)
Subjective:  Feeling better. Afebrile.  Objective:  Vital Signs in the last 24 hours: Temp:  [97.9 F (36.6 C)-98.7 F (37.1 C)] 98.3 F (36.8 C) (03/28 1119) Pulse Rate:  [32-231] 73 (03/28 1400) Cardiac Rhythm:  [-] Ventricular paced (03/28 1200) Resp:  [13-26] 20 (03/28 1400) BP: (105-161)/(53-123) 121/63 mmHg (03/28 1400) SpO2:  [94 %-100 %] 97 % (03/28 1400)  Physical Exam: BP Readings from Last 1 Encounters:  04/12/13 121/63     Wt Readings from Last 1 Encounters:  04/11/13 74.3 kg (163 lb 12.8 oz)    Weight change:   HEENT: [place]/AT, Eyes-Brown, PERL, EOMI, Conjunctiva-Pale, Sclera-Non-icteric Neck: No JVD, No bruit, Trachea midline. Lungs:  Clear, Bilateral. Cardiac:  Regular rhythm, normal S1 and S2, no S3.  Abdomen:  Soft, non-tender. Extremities:  No edema present. No cyanosis. No clubbing. CNS: AxOx3, Cranial nerves grossly intact. Bilateral BKA. Skin: Warm and dry.   Intake/Output from previous day: 03/27 0701 - 03/28 0700 In: 3257 [I.V.:3257] Out: 2175 [Urine:1575; Blood:600]    Lab Results: BMET    Component Value Date/Time   NA 140 04/12/2013 0340   K 4.1 04/12/2013 0340   CL 105 04/12/2013 0340   CO2 23 04/12/2013 0340   GLUCOSE 93 04/12/2013 0340   BUN 13 04/12/2013 0340   CREATININE 0.92 04/12/2013 0340   CALCIUM 9.3 04/12/2013 0340   GFRNONAA 78* 04/12/2013 0340   GFRAA 90* 04/12/2013 0340   CBC    Component Value Date/Time   WBC 7.6 04/12/2013 0340   RBC 3.12* 04/12/2013 0340   HGB 8.8* 04/12/2013 0340   HCT 26.7* 04/12/2013 0340   PLT 223 04/12/2013 0340   MCV 85.6 04/12/2013 0340   MCH 28.2 04/12/2013 0340   MCHC 33.0 04/12/2013 0340   RDW 16.2* 04/12/2013 0340   LYMPHSABS 1.4 04/12/2013 0340   MONOABS 0.4 04/12/2013 0340   EOSABS 0.1 04/12/2013 0340   BASOSABS 0.0 04/12/2013 0340   CARDIAC ENZYMES Lab Results  Component Value Date   CKTOTAL 81 09/21/2009   CKMB 1.5 09/21/2009   TROPONINI  Value: 0.22        PERSISTENTLY INCREASED TROPONIN VALUES  IN THE RANGE OF 0.06-0.49 ng/mL CAN BE SEEN IN:       -UNSTABLE ANGINA       -CONGESTIVE HEART FAILURE       -MYOCARDITIS       -CHEST TRAUMA       -ARRYHTHMIAS       -LATE PRESENTING MI       -COPD   CLINICAL FOLLOW-UP RECOMMENDED.* 09/21/2009    Scheduled Meds: . aspirin  325 mg Oral Q breakfast  . atorvastatin  80 mg Oral Daily  . carvedilol  12.5 mg Oral BID WC  . clopidogrel  75 mg Oral Daily  . insulin aspart  0-9 Units Subcutaneous TID WC  . nitroGLYCERIN  0.4 mg Transdermal Daily  . pantoprazole  40 mg Oral Daily  . ramipril  5 mg Oral BID   Continuous Infusions: . sodium chloride 1,000 mL (04/10/13 0441)  . sodium chloride 75 mL/hr at 04/12/13 1349  . niCARDipine Stopped (04/12/13 0900)   PRN Meds:.acetaminophen, acetaminophen, nitroGLYCERIN, ondansetron (ZOFRAN) IV, senna-docusate  Assessment/Plan:  Probable Recurrent CVA versus reversible ischemic neurological deficit  Severe cerebrovascular disease status post four-vessel cerebral angiogram status post PTA/stenting of left internal carotid artery  History of CVA in the past status post PTA to left mid cerebral artery in the past  Coronary artery  disease history of MI in the past  Ischemic cardiomyopathy  Compensated systolic heart failure  History of complete heart block status post permanent pacemaker  Hypertension  Non-insulin-dependent diabetes mellitus  Severe peripheral vascular disease status post bilateral below-knee amputation in the past  Hypercholesteremia Anemia   Plan   Continue medical treatment. Anemia work up.   LOS: 9 days    Orpah Cobb  MD  04/12/2013, 2:31 PM

## 2013-04-13 LAB — GLUCOSE, CAPILLARY
GLUCOSE-CAPILLARY: 146 mg/dL — AB (ref 70–99)
Glucose-Capillary: 97 mg/dL (ref 70–99)
Glucose-Capillary: 290 mg/dL — ABNORMAL HIGH (ref 70–99)
Glucose-Capillary: 111 mg/dL — ABNORMAL HIGH (ref 70–99)
Glucose-Capillary: 85 mg/dL (ref 70–99)

## 2013-04-13 LAB — IRON AND TIBC
IRON: 45 ug/dL (ref 42–135)
Saturation Ratios: 20 % (ref 20–55)
TIBC: 222 ug/dL (ref 215–435)
UIBC: 177 ug/dL (ref 125–400)

## 2013-04-13 LAB — CBC
HCT: 25 % — ABNORMAL LOW (ref 39.0–52.0)
HEMOGLOBIN: 8.3 g/dL — AB (ref 13.0–17.0)
MCH: 28.2 pg (ref 26.0–34.0)
MCHC: 33.2 g/dL (ref 30.0–36.0)
MCV: 85 fL (ref 78.0–100.0)
PLATELETS: 213 10*3/uL (ref 150–400)
RBC: 2.94 MIL/uL — AB (ref 4.22–5.81)
RDW: 16.2 % — ABNORMAL HIGH (ref 11.5–15.5)
WBC: 7 10*3/uL (ref 4.0–10.5)

## 2013-04-13 LAB — FERRITIN: Ferritin: 74 ng/mL (ref 22–322)

## 2013-04-13 NOTE — Progress Notes (Signed)
Subjective:  Confused. T max 99.4 F.  Objective:  Vital Signs in the last 24 hours: Temp:  [97.8 F (36.6 C)-99.4 F (37.4 C)] 98.1 F (36.7 C) (03/29 0948) Pulse Rate:  [54-74] 67 (03/29 0948) Cardiac Rhythm:  [-] Ventricular paced (03/28 2000) Resp:  [16-26] 18 (03/29 0948) BP: (111-160)/(55-85) 138/56 mmHg (03/29 0948) SpO2:  [94 %-100 %] 100 % (03/29 0948) Weight:  [74.844 kg (165 lb)] 74.844 kg (165 lb) (03/28 1705)  Physical Exam: BP Readings from Last 1 Encounters:  04/13/13 138/56     Wt Readings from Last 1 Encounters:  04/12/13 74.844 kg (165 lb)    Weight change: 0.543 kg (1 lb 3.2 oz)  HEENT: [place]/AT, Eyes-Brown, PERL, EOMI, Conjunctiva-Pale, Sclera-Non-icteric Neck: No JVD, No bruit, Trachea midline. Lungs:  Clear, Bilateral. Cardiac:  Regular rhythm, normal S1 and S2, no S3.  Abdomen:  Soft, non-tender. Extremities:  No edema present. No cyanosis. No clubbing. CNS: AxOx3, Cranial nerves grossly intact. Bil. BKA. Skin: Warm and dry.   Intake/Output from previous day: 03/28 0701 - 03/29 0700 In: 711 [I.V.:711] Out: 350 [Urine:350]    Lab Results: BMET    Component Value Date/Time   NA 140 04/12/2013 0340   K 4.1 04/12/2013 0340   CL 105 04/12/2013 0340   CO2 23 04/12/2013 0340   GLUCOSE 93 04/12/2013 0340   BUN 13 04/12/2013 0340   CREATININE 0.92 04/12/2013 0340   CALCIUM 9.3 04/12/2013 0340   GFRNONAA 78* 04/12/2013 0340   GFRAA 90* 04/12/2013 0340   CBC    Component Value Date/Time   WBC 7.0 04/13/2013 0403   RBC 2.94* 04/13/2013 0403   HGB 8.3* 04/13/2013 0403   HCT 25.0* 04/13/2013 0403   PLT 213 04/13/2013 0403   MCV 85.0 04/13/2013 0403   MCH 28.2 04/13/2013 0403   MCHC 33.2 04/13/2013 0403   RDW 16.2* 04/13/2013 0403   LYMPHSABS 1.4 04/12/2013 0340   MONOABS 0.4 04/12/2013 0340   EOSABS 0.1 04/12/2013 0340   BASOSABS 0.0 04/12/2013 0340   CARDIAC ENZYMES Lab Results  Component Value Date   CKTOTAL 81 09/21/2009   CKMB 1.5 09/21/2009   TROPONINI   Value: 0.22        PERSISTENTLY INCREASED TROPONIN VALUES IN THE RANGE OF 0.06-0.49 ng/mL CAN BE SEEN IN:       -UNSTABLE ANGINA       -CONGESTIVE HEART FAILURE       -MYOCARDITIS       -CHEST TRAUMA       -ARRYHTHMIAS       -LATE PRESENTING MI       -COPD   CLINICAL FOLLOW-UP RECOMMENDED.* 09/21/2009    Scheduled Meds: . aspirin  325 mg Oral Q breakfast  . atorvastatin  80 mg Oral Daily  . carvedilol  12.5 mg Oral BID WC  . clopidogrel  75 mg Oral Daily  . insulin aspart  0-9 Units Subcutaneous TID WC  . nitroGLYCERIN  0.4 mg Transdermal Daily  . pantoprazole  40 mg Oral Daily  . ramipril  5 mg Oral BID   Continuous Infusions: . sodium chloride 1,000 mL (04/10/13 0441)  . sodium chloride 25 mL/hr at 04/12/13 1430  . niCARDipine Stopped (04/12/13 0900)   PRN Meds:.acetaminophen, acetaminophen, nitroGLYCERIN, ondansetron (ZOFRAN) IV, senna-docusate  Assessment/Plan: Probable Recurrent CVA versus reversible ischemic neurological deficit  Severe cerebrovascular disease status post four-vessel cerebral angiogram status post PTA/stenting of left internal carotid artery  History of CVA in the  past status post PTA to left mid cerebral artery in the past  Coronary artery disease history of MI in the past  Ischemic cardiomyopathy  Compensated systolic heart failure  History of complete heart block status post permanent pacemaker  Hypertension  Non-insulin-dependent diabetes mellitus  Severe peripheral vascular disease status post bilateral below-knee amputation in the past  Hypercholesteremia  Anemia   Continue medical treatment.  Anemia work up.    LOS: 10 days    Orpah CobbAjay Boston Catarino  MD  04/13/2013, 11:54 AM

## 2013-04-14 LAB — CBC
HEMATOCRIT: 25.5 % — AB (ref 39.0–52.0)
HEMOGLOBIN: 8.6 g/dL — AB (ref 13.0–17.0)
MCH: 28.9 pg (ref 26.0–34.0)
MCHC: 33.7 g/dL (ref 30.0–36.0)
MCV: 85.6 fL (ref 78.0–100.0)
Platelets: 205 10*3/uL (ref 150–400)
RBC: 2.98 MIL/uL — AB (ref 4.22–5.81)
RDW: 16.4 % — ABNORMAL HIGH (ref 11.5–15.5)
WBC: 6.6 10*3/uL (ref 4.0–10.5)

## 2013-04-14 LAB — GLUCOSE, CAPILLARY
GLUCOSE-CAPILLARY: 94 mg/dL (ref 70–99)
GLUCOSE-CAPILLARY: 139 mg/dL — AB (ref 70–99)
GLUCOSE-CAPILLARY: 156 mg/dL — AB (ref 70–99)
GLUCOSE-CAPILLARY: 119 mg/dL — AB (ref 70–99)

## 2013-04-14 MED ORDER — FERROUS SULFATE 325 (65 FE) MG PO TABS
325.0000 mg | ORAL_TABLET | Freq: Three times a day (TID) | ORAL | Status: DC
Start: 2013-04-14 — End: 2013-04-15
  Administered 2013-04-14 – 2013-04-15 (×4): 325 mg via ORAL
  Filled 2013-04-14 (×5): qty 1

## 2013-04-14 NOTE — Progress Notes (Addendum)
Occupational Therapy Treatment Patient Details Name: [name] MRN: [id number] DOB: Mar 12, 1931 Today's Date: 04/14/2013    History of present illness Patient is 80 year old male with past medical history significant for multiple medical problems i.e. 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 Patient denies any seizure activities. Denies any headaches in ED his motor strength is improved but continues to have slurred speech. Denies any blurring of vision but states he feels numb all over. Patient denies any palpitation lightheadedness or syncope. Denies any chest pain denies shortness of breath   OT comments  Pt performed grooming tasks as well as practiced donning sleeves for prosthetics. Recommending SNF for rehab.    Follow Up Recommendations  SNF    Equipment Recommendations  Other (comment) (defer to next venue)    Recommendations for Other Services      Precautions / Restrictions Precautions Precautions: Fall Restrictions Weight Bearing Restrictions: No       Mobility Bed Mobility Overal bed mobility: Needs Assistance Bed Mobility: Supine to Sit     Supine to sit: Max assist     General bed mobility comments: cues for technique.  Transfers Overall transfer level: Needs assistance   Transfers: Licensed conveyancerAnterior-Posterior Transfer       Anterior-Posterior transfers: +2 physical  assistance;Max assist   General transfer comment: Cues for technique. Pt practiced transfer 2x.     Balance                                   ADL   Grooming: Minimal assistance;Sitting;Wash/dry face      Lower Body Bathing: Setup/Supervision: sitting Lower Body Dressing: Minimal assistance (prosthetic sleeves) Toilet Transfer: +2 for physical assistance;Maximal assistance;Anterior/posterior     Functional mobility during ADLs: +2 for physical assistance;Maximal assistance (for anterior/posterior transfer) General ADL Comments: Pt able to sit EOB unsupported while nurse gave him his meds. Pt washed face sitting EOB with increased time to start task and he kept dropping washcloth. Pt performed anterior/posterior transfer from bed <> chair x2. Pt practiced donning prosthetic sleeves sitting in chair and also practiced opening soap container. Pt required assistance to initiated task to don prosthetic sleeve.      Vision                     Perception     Praxis      Cognition   Behavior During Therapy: [facility] for tasks assessed/performed Overall Cognitive Status: No family/caregiver present to determine baseline cognitive functioning Area of Impairment: Orientation;Problem solving;Following commands Orientation Level: Disoriented to;Place;Time   Memory: Decreased short-term memory  Following Commands: Follows one step commands with increased time;Follows one step commands inconsistently     Problem Solving: Slow processing;Decreased initiation;Requires verbal cues;Requires tactile cues      Extremity/Trunk Assessment               Exercises       General Comments  Pertinent Vitals/ Pain       No apparent distress.   Home Living                                          Prior Functioning/Environment              Frequency Min 2X/week     Progress Toward Goals  OT Goals(current goals can now be found in the  care plan section)  Progress towards OT goals: Progressing toward goals  Acute Rehab OT Goals Patient Stated Goal: not stated OT Goal Formulation: Patient unable to participate in goal setting Time For Goal Achievement: 04/18/13 Potential to Achieve Goals: Good ADL Goals Pt Will Perform Eating: with min assist;sitting (with L hand) Pt Will Perform Grooming: with supervision;with set-up;sitting Pt Will Transfer to Toilet: with mod assist;stand pivot transfer;bedside commode Pt Will Perform Toileting - Clothing Manipulation and hygiene: with min assist;sitting/lateral leans Additional ADL Goal #1: Pt will sit EOB unsupported with min assist  x 5 minutes as a precursor to ADL. Additional ADL Goal #2: Pt will use R UE to stabilize an object as the L manipulates with min assist. Additional ADL Goal #3: Visual testing will be completed and appropriate goals set accordingly. Additional ADL Goal #4: Pt will don/doff prosthetics with Mod A.  Plan Discharge plan needs to be updated    End of Session    Activity Tolerance Patient tolerated treatment well   Patient Left in chair;with call bell/phone within reach;with chair alarm set   Nurse Communication          Time: 1610-9604 OT Time Calculation (min): 22 min  Charges: OT General Charges $OT Visit: 1 Procedure OT Treatments $Self Care/Home Management : 8-22 mins  Earlie Raveling OTR/L [phone] 04/14/2013, 11:59 AM

## 2013-04-14 NOTE — Progress Notes (Signed)
   CSW contacted daughter Mauri Poleasha Tisdale [phone]  concerning d/c of Pt. Daughter not available. CSW left a message for a call back to discuss facility choice and d/c planning.   [facility] and [facility] are not aware of the Pt and will look over Pt information.   [facility].    Leron Croakassandra Ezella Kell [facility]WA  [facility]  4N 1[address]16;  [phone] Phone: [phone]

## 2013-04-14 NOTE — Progress Notes (Signed)
Speech Language Pathology Treatment: Dysphagia;Cognitive-Linquistic  Patient Details Name: [name] ParisBilly [name] MRN: [id number] DOB: [birth year] Today's Date: 04/14/2013 Time: 0825-0839 SLP Time Calculation (min): 14 min  Assessment / Plan / Recommendation Clinical Impression  Pt seen to assess tolerance of po diet and to address cognitive linguistic goals.  Pt lying in bed and awoke briefly (for approx 30 seconds at a time) to participate in therapy.    Pt observed consuming thin water via straw - no indications of aspiration - although pt does report occasionally becoming "choked" on liquids.  Frequent belching observed - ? If pt may have reflux issues?  Pt confirms symptoms of reflux when asked; it does not appear he is on a reflux medicine from chart review.  Cognitive linguistic goals addressed include attention, basic problem solving and basic verbal communication skills.  Pt required total cues to repeat name of current location - his volitional response was unintelligible.  He was able to articulate his name independently and point to a call bell to indicate how to reach nurse.  He required total cues to locate red button on call bell.   Suspect pt's lethargy negatively impacted participation during therapy today.  Will continue to follow for cognitive linguistic treatment.  Pt will benefit from 24/7 supervision upon dc due to level of cog-ling deficits.     HPI HPI: Patient is an 80 y.o. male admitted to hospital secondary to difficulty moving, slurred speech, and left-sided weakness. PMH: CAD, MI, ischemic cardiomyopathy, CHF, complete heart block, s/p permanent pacemaker placement, HTN, non-insulin dependent DM, PVD, s/p BKA, h/o CVA and PTA to middle cerebral artery.   Pertinent Vitals Afebrile, decreased  SLP Plan  Continue with current plan of care  No further dysphagia tx- dys2 appropriate long term given pt's edentulous status    Recommendations Diet recommendations: Dysphagia 2 (fine  chop);Thin liquid Liquids provided via: Straw Medication Administration: Whole meds with puree (as tolerated) Supervision: Full supervision/cueing for compensatory strategies Compensations: Clear throat after each swallow;Small sips/bites;Check for pocketing Postural Changes and/or Swallow Maneuvers: Seated upright 90 degrees;Upright 30-60 min after meal              Oral Care Recommendations: Oral care BID Follow up Recommendations: Inpatient Rehab Plan: Continue with current plan of care    Donavan Burnetamara Naturi Alarid, MS [facility] SLP [phone]

## 2013-04-14 NOTE — Progress Notes (Addendum)
Physical Therapy Treatment Patient Details Name: [name] MRN: [id number] DOB: May 14, 1931 Today's Date: 04/14/2013    History of Present Illness Patient is 80 year old male with past medical history significant for multiple medical problems i.e. 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 Patient denies any seizure activities. Denies any headaches in ED his motor strength is improved but continues to have slurred speech. Denies any blurring of vision but states he feels numb all over. Patient denies any palpitation lightheadedness or syncope. Denies any chest pain denies shortness of breath    PT Comments    Patients son able to bring sleeves and socks for prothesis. Patient able to sit up in recliner and attempt to work on donning however having difficulty initiating and sequencing movements. Patient became frustrated and did not want assistance when offered. Patient pushed away and requested to go back to bed.   Follow Up Recommendations  SNF     Equipment Recommendations  None recommended by PT    Recommendations for Other Services       Precautions / Restrictions Precautions Precautions: Fall Restrictions Weight Bearing Restrictions: No    Mobility  Bed Mobility Overal bed mobility: Needs Assistance Bed Mobility: Rolling Rolling: Min assist   Supine to sit: Max assist Sit to  supine: Min assist   General bed mobility comments: cues for technique. A for positoning prior to lying down. Patient able to reach back to head of bed and assist with scooting HOB  Transfers Overall transfer level: Needs assistance   Transfers: Anterior-Posterior Transfer       Anterior-Posterior transfers: +2 physical assistance;Max assist   General transfer comment: Use of blanket and chuck pad to transfer back to bed from recliner. Patient able to assist with trunk control and leaning more forward this session. Patient also attempting to assist more by pushing up from armrest  Ambulation/Gait                 Stairs            Wheelchair Mobility    Modified Rankin (Stroke Patients Only)       Balance     Sitting balance-Leahy Scale: Fair Sitting balance - Comments: Patient able to sit in recliner and lean forwards while attempt to don sock and sleeve for prothesis. Patient able to don sleeve than attempted to don sock on opposite side. Patient difficulty initiating this task and following through once assisted                            Cognition Arousal/Alertness: Awake/alert Behavior During Therapy: WFL for tasks assessed/performed Overall Cognitive Status: No family/caregiver present to determine baseline cognitive functioning Area of Impairment: Orientation;Problem solving;Following commands Orientation Level: Disoriented to;Place;Time   Memory: Decreased short-term memory Following Commands: Follows one step commands with increased time;Follows one step commands inconsistently     Problem Solving: Slow processing;Decreased initiation;Requires verbal cues;Requires tactile cues General Comments: Patient having difficulty  sequencing donning sock and sleeves for prothesis. Patient becoming frustrated and did not want assistance. Task was ended.     Exercises      General Comments        Pertinent Vitals/Pain no apparent distress      Home Living                      Prior Function            PT Goals (current goals can now be found in the care plan section) Acute Rehab PT Goals Patient Stated Goal: not stated Progress towards PT goals: Progressing toward goals    Frequency  Min 3X/week    PT Plan Discharge plan needs to be updated    End of Session   Activity Tolerance: Patient tolerated treatment well Patient left: in bed;with call bell/phone within reach     Time: 1257-1324 PT Time Calculation (min): 27 min  Charges:  $Therapeutic Activity: 23-37 mins                    G Codes:      Fredrich BirksRobinette, Jajuan Skoog Elizabeth 04/14/2013, 1:56 PM  04/14/2013 Fredrich Birksobinette, Julianny Milstein Elizabeth PTA [phone] pager [phone] office

## 2013-04-14 NOTE — Progress Notes (Signed)
Subjective:  Patient denies any chest pain or shortness of breath. Denies any abdominal pain. Had significant drop in hemoglobin post procedure probably due  to hydration and blood loss during the procedure rule out GI loss. Although hemoglobin has been stable for last few days.  Objective:  Vital Signs in the last 24 hours: Temp:  [97.8 F (36.6 C)-99 F (37.2 C)] 97.9 F (36.6 C) (03/30 0930) Pulse Rate:  [52-84] 63 (03/30 0930) Resp:  [18] 18 (03/30 0930) BP: (123-159)/(48-85) 137/57 mmHg (03/30 0930) SpO2:  [90 %-100 %] 93 % (03/30 0930)  Intake/Output from previous day: 03/29 0701 - 03/30 0700 In: 340 [P.O.:340] Out: 400 [Urine:400] Intake/Output from this shift: Total I/O In: 300 [P.O.:300] Out: -   Physical Exam: Neck: no adenopathy, no carotid bruit, no JVD and supple, symmetrical, trachea midline Lungs: clear to auscultation bilaterally Heart: regular rate and rhythm, S1, S2 normal, no murmur, click, rub or gallop Abdomen: soft, non-tender; bowel sounds normal; no masses,  no organomegaly Extremities: Right groin stable bilateral BKA  Lab Results:  Recent Labs  04/13/13 0403 04/14/13 0419  WBC 7.0 6.6  HGB 8.3* 8.6*  PLT 213 205    Recent Labs  04/12/13 0340  NA 140  K 4.1  CL 105  CO2 23  GLUCOSE 93  BUN 13  CREATININE 0.92   No results found for this basename: TROPONINI, CK, MB,  in the last 72 hours Hepatic Function Panel No results found for this basename: PROT, ALBUMIN, AST, ALT, ALKPHOS, BILITOT, BILIDIR, IBILI,  in the last 72 hours No results found for this basename: CHOL,  in the last 72 hours No results found for this basename: PROTIME,  in the last 72 hours  Imaging: Imaging results have been reviewed and No results found.  Cardiac Studies:  Assessment/Plan:  Probable Recurrent CVA versus reversible ischemic neurological deficit  Severe cerebrovascular disease status post four-vessel cerebral angiogram status post PTA/stenting of  left internal carotid artery  History of CVA in the past status post PTA to left mid cerebral artery in the past  Coronary artery disease history of MI in the past  Ischemic cardiomyopathy  Compensated systolic heart failure  History of complete heart block status post permanent pacemaker  Hypertension  Non-insulin-dependent diabetes mellitus  Severe peripheral vascular disease status post bilateral below-knee amputation in the past  Hypercholesteremia  Acute on chronic anemia probably secondary to hydration and blood loss during procedure rule out GI loss  Plan Continue present management Check stool for occult blood Awaiting skilled nursing facility Increase ambulation  LOS: 11 days    [name] N 04/14/2013, 12:48 PM

## 2013-04-14 NOTE — Progress Notes (Signed)
PT Cancellation Note  Patient Details Name: Conchita ParisBilly Dougal MRN: [id number] DOB: 04/08/1931   Cancelled Treatment:    Reason Eval/Treat Not Completed: Medical issues which prohibited therapy. Currently last order on the 27th is for bedrest. Will need updated activity order. RN made aware   Fredrich BirksRobinette, Nikoloz Huy Elizabeth 04/14/2013, 9:39 AM

## 2013-04-15 ENCOUNTER — Encounter (HOSPITAL_COMMUNITY): Payer: Self-pay | Admitting: Interventional Radiology

## 2013-04-15 LAB — BASIC METABOLIC PANEL
BUN: 14 mg/dL (ref 6–23)
CHLORIDE: 106 meq/L (ref 96–112)
CO2: 21 meq/L (ref 19–32)
CREATININE: 1.03 mg/dL (ref 0.50–1.35)
Calcium: 9.2 mg/dL (ref 8.4–10.5)
GFR calc Af Amer: 77 mL/min — ABNORMAL LOW (ref >90)
GFR calc non Af Amer: 66 mL/min — ABNORMAL LOW (ref >90)
Glucose, Bld: 155 mg/dL — ABNORMAL HIGH (ref 70–99)
Potassium: 4.1 mEq/L (ref 3.7–5.3)
SODIUM: 140 meq/L (ref 137–147)

## 2013-04-15 LAB — TYPE AND SCREEN
ABO/RH(D): A POS
ANTIBODY SCREEN: NEGATIVE
UNIT DIVISION: 0
UNIT DIVISION: 0

## 2013-04-15 LAB — GLUCOSE, CAPILLARY
Glucose-Capillary: 90 mg/dL (ref 70–99)
Glucose-Capillary: 162 mg/dL — ABNORMAL HIGH (ref 70–99)
Glucose-Capillary: 90 mg/dL (ref 70–99)

## 2013-04-15 LAB — CBC
HCT: 25 % — ABNORMAL LOW (ref 39.0–52.0)
Hemoglobin: 8.4 g/dL — ABNORMAL LOW (ref 13.0–17.0)
MCH: 29 pg (ref 26.0–34.0)
MCHC: 33.6 g/dL (ref 30.0–36.0)
MCV: 86.2 fL (ref 78.0–100.0)
PLATELETS: 209 10*3/uL (ref 150–400)
RBC: 2.9 MIL/uL — AB (ref 4.22–5.81)
RDW: 16.6 % — ABNORMAL HIGH (ref 11.5–15.5)
WBC: 8.2 10*3/uL (ref 4.0–10.5)

## 2013-04-15 MED ORDER — PANTOPRAZOLE SODIUM 40 MG PO TBEC: 40.0000 mg | DELAYED_RELEASE_TABLET | Freq: Every day | ORAL | Status: DC

## 2013-04-15 MED ORDER — SENNOSIDES-DOCUSATE SODIUM 8.6-50 MG PO TABS: 1.0000 | ORAL_TABLET | Freq: Every evening | ORAL | Status: DC | PRN

## 2013-04-15 MED ORDER — FERROUS SULFATE 325 (65 FE) MG PO TABS: 325.0000 mg | ORAL_TABLET | Freq: Three times a day (TID) | ORAL | Status: DC

## 2013-04-15 NOTE — Progress Notes (Signed)
  D/C summary received in medical record. Nurse is aware. D/C summary will be printed and PTAR scheduled for pickup.    No further CSW needs at this time.     Leron Croakassandra Marylu Dudenhoeffer [facility] Center[facility]WA  [facility]  4N 1-16;  [phone] Phone: [phone]

## 2013-04-15 NOTE — Progress Notes (Signed)
Agree with PTA.    Khailee Mick, PT [phone]  

## 2013-04-15 NOTE — Discharge Summary (Signed)
  [place]  discharge dictated on 04/15/2013 dictation number is [phone]

## 2013-04-15 NOTE — Progress Notes (Signed)
   [facility] and [facility] are [facility] a bed to Pt today.   MD and CM notified.   CSW will continue to follow Pt for d/c planning.    Leron Croakassandra Kambrie [facility]  [facility]  4N 1-16;  [phone] Phone: [phone]

## 2013-04-15 NOTE — Progress Notes (Signed)
  CSW spoke with medical records and the d/c summary was not ordered stat and is still being transcribed. Medical records stated that it should be today for completion.   [facility] this evening.   D/C summary will need to be printed and placed in the packet once completed.    CSW will continue to follow Pt for d/c planning.    [name] [facility]  [facility]  4N 1-16;  [phone] Phone: [phone]

## 2013-04-15 NOTE — H&P (Signed)
Report called [facility] Jud[facility]Stephanie [facility]. IV site d/c'd as well as tele. Patient awaiting PTAR at this time.

## 2013-04-15 NOTE — Discharge Instructions (Signed)
Carotid Artery Disease  The carotid arteries are the two main arteries on either sides of the neck that supply blood to the brain. Carotid artery disease, also called carotid artery stenosis, is the narrowing or blockage of one or both carotid arteries. Carotid artery disease increases your risk for a stroke or a transient ischemic attack (TIA). A TIA is an episode in which a waxy, fatty substance that accumulates within the artery (plaque) blocks blood flow to the brain. A TIA is considered a "warning stroke."  CAUSES   Atherosclerosis (common). Atherosclerosis is a disease in which plaque builds up inside the carotid arteries.  Aneurysm. This is a weakened outpouching in an artery.  Arteritis. This is inflammation of the carotid artery.  Fibromuscular dysplasia. This is a fibrous growth within the carotid artery.  Post-radiation necrosis. This is tissue death within the carotid artery due to radiation treatment.  Vasospasm. This is decreased blood flow due to spasms of the carotid artery.  Carotid dissection. This is separation of the walls of the carotid artery. RISK FACTORS  High cholesterol (dyslipidemia).   High blood pressure (hypertension).   Smoking.   Obesity.   Diabetes.   Family history of cardiovascular disease.   Inactivity or lack of regular exercise.   Being male. Men have an increased risk of developing atherosclerosis earlier in life than women.  SYMPTOMS  Carotid artery disease does not cause symptoms. DIAGNOSIS Diagnosis of carotid artery disease may include:   A physical exam. Your health care provider may hear an abnormal sound (bruit) when listening to the carotid arteries.   Specific tests that look at the blood flow in the carotid arteries. These tests include:   Carotid artery ultrasonography.   Carotid or cerebral angiography.   Computerized tomographic angiography (CTA).   Magnetic resonance angiography (MRA).  TREATMENT   Treatment of carotid artery disease can include a combination of treatments. Treatment options include:  Surgery. You may have:   A carotid endarterectomy. This is a surgery to remove the blockages in the carotid arteries.   A carotid angioplasty with stenting. This is a nonsurgical interventional procedure. A wire mesh (stent) is used to widen the blocked carotid arteries.   Medicines to control blood pressure, cholesterol, and reduce blood clotting (antiplatelet therapy).   Adjusting your diet.   Lifestyle changes such as:   Quitting smoking.   Exercising as tolerated or as directed by your health care provider.   Controlling and maintaining a good blood pressure.   Keeping cholesterol levels under control.  HOME CARE INSTRUCTIONS   Take medicines as directed by your health care provider. Make sure you understand all your medicine instructions. Do not stop your medicines without talking to your health care provider.   Follow your health care provider's diet instructions. It is important to eat a healthy diet that is low in saturated fats and includes plenty of fresh fruits, vegetables, and lean meats. High-fat, high-sodium foods as well as foods that are fried, overly processed, or have poor nutritional value should be avoided.  Maintain a healthy weight.   Stay physically active. It is recommended that you get at least 30 minutes of activity every day.   Do not smoke.   Limit alcohol use to:   No more than 2 drinks per day for men.   No more than 1 drink per day for nonpregnant women.   Do not use illegal drugs.   Keep all follow-up appointments as directed by your health care   provider.  SEEK IMMEDIATE MEDICAL CARE IF:  If you develop TIA or stroke symptoms. These include:   Sudden weakness or numbness on one side of the body, such as in the face, arm, or leg.   Sudden confusion.   Trouble speaking (aphasia) or understanding.   Sudden  trouble seeing out of one or both eyes.   Sudden trouble walking.   Dizziness or feeling like you might faint.   Loss of balance or coordination.   Sudden severe headache with no known cause.   Sudden trouble swallowing (dysphagia).  If you have any of these symptoms, call your local emergency services (911 in [place] Do not drive yourself to the clinic or hospital. This is a medical emergency.  Document Released: 03/27/2011 Document Revised: 09/04/2012 Document Reviewed: 07/03/2012 [employer] Patient Information 2014 [employer].  

## 2013-04-15 NOTE — Progress Notes (Signed)
Patient is discharged from unit 4N02 at this time.Transported by PTAR via stretcher. Alert and in stable condition.

## 2013-04-15 NOTE — Discharge Summary (Signed)
NAMLouretta [name]:  Beadles, [place]                ACCOUNT NO.:  [id number]  MEDICAL RECORD NO.:  [id number]  LOCATION:  4N02C                        FACILITY:  [facility]  PHYSICIAN:  Eduardo OsierMohan N. Sharyn LullHarwani, M.D. DATE OF BIRTH:  1931/06/24  DATE OF ADMISSION:  04/03/2013 DATE OF DISCHARGE:  04/15/2013                              DISCHARGE SUMMARY   [facility]  ADMITTING DIAGNOSES: 1. Rule out recurrent cerebrovascular accident. 2. History of cerebrovascular accident in the past status post PTA to     mid cerebral artery in the past, coronary artery disease, history     of myocardial infarction in the past. 3. Ischemic cardiomyopathy. 4. Compensated systolic heart failure. 5. History of complete heart block status post permanent pacemaker in     the past. 6. Hypertension. 7. Non-insulin-dependent diabetes mellitus. 8. Severe peripheral vascular disease, status post bilateral below-     knee amputation in the past. 9. Hypercholesteremia.  DISCHARGE DIAGNOSES: 1. Status post probable recurrent cerebrovascular accident versus     reversible ischemic neurological deficit. 2. Cerebrovascular accident, cerebrovascular disease status post 4     vessel cerebral angiogram followed by PTA/stenting to left internal     carotid artery with excellent results. 3. History of cerebrovascular accident in the past status post PTA to     mid cerebral artery in the past. 4. Coronary artery disease, history of myocardial infarction in the     past. 5. Ischemic cardiomyopathy. 6. Compensated systolic heart failure. 7. History of complete heart block status post permanent pacemaker in     the past. 8. Hypertension. 9. Non-insulin-dependent diabetes mellitus. 10.Severe peripheral vascular disease status post bilateral below-knee     amputation in the past. 11.Hypercholesteremia. 12.Acute on chronic anemia secondary to hydration and blood loss     during  the procedure, which is stable.  DISCHARGE HOME MEDICATIONS: 1. Ferrous sulfate 325 mg 1 tablet 3 times daily with meals. 2. Benicar 1 tablet by mouth as needed for constipation. 3. Tylenol 325 mg every 6 hours as needed for mild pain. 4. Aspirin 81 mg 1 tablet daily. 5. Atorvastatin 80 mg 1 tablet daily. 6. Carvedilol 6.25 mg 1 tablet twice daily. 7. Clopidogrel 75 mg 1 tablet daily. 8. Furosemide 40 mg 1 tablet daily. 9. Imdur 60 mg 1 tablet daily. 10.Nitrostat 0.4 mg sublingual use as directed. 11.Protonix 40 mg 1 tablet daily. 12.Ramipril 5 mg 1 capsule daily.  The patient has been advised to     stop ibuprofen and trazodone.  DIET:  Low-salt, low-cholesterol 1800 calories ADA diet.  Activity as tolerated with crutches and the patient also has prosthesis.  [facility]  CONDITION AT DISCHARGE:  Stable.  BRIEF HISTORY AND HOSPITAL COURSE:  Mr. Claudette [name] is an 80 year old male with past medical history significant for multiple medical problems, i.e., coronary artery disease, history of non-Q-wave MI in the past, ischemic cardiomyopathy, history of congestive heart failure secondary to systolic dysfunction, history of complete heart block status post permanent pacemaker in the past, hypertension, non-insulin-dependent diabetes mellitus, severe peripheral vascular disease with gangrene of the foot  status post bilateral below-knee amputation in the past, hypercholesteremia, history of CVA in the past status post PTA to middle cerebral artery in the past.  He came to the ER by EMS as the patient was noted to have difficulty moving and slurred speech and left-sided weakness.  The patient denies any seizure activity.  Denies any headaches in the ED.  His motor strength has improved, but continues to have slurred speech.  The patient denies any blurring of vision, but states he feels numb all over.  The patient denies any  palpitation, lightheadedness, or syncope.  Denies any chest pain.  Denies shortness of breath.  PAST MEDICAL HISTORY:  As above.  PHYSICAL EXAMINATION:  VITAL SIGNS:  His blood pressure in the ED was 197/99, pulse was 85.  He was afebrile.  Conjunctivae was pink. NECK:  Supple.  No JVD.  No bruit. LUNGS:  Clear to auscultation without rhonchi or rales. CARDIOVASCULAR:  S1, S2 was normal.  There was soft systolic murmur.  No S3 gallop. ABDOMEN:  Soft.  Bowel sounds were present.  Nontender. EXTREMITIES:  The patient has bilateral BKA. NEUROLOGIC:  He was alert, awake, oriented x3.  Motor strength was 5/5 in right upper and lower extremity and 4/5 in the left upper extremity and 5/5 in the left lower extremity.  LABORATORY DATA:  Sodium was 144, potassium 5.3, BUN 16, creatinine 0.96, glucose was 134.  Cholesterol was 125, triglycerides 49, HDL was 52, LDL cholesterol was 63.  Hemoglobin was 11.1, hematocrit 34.1, white count of 6.3.  His hemoglobin A1c was 6.8.  CT of the brain done in the ED showed atrophy with mild small vessel disease, prior small lacunar infarcts and the mid left cerebellum in the superior aspect of the head of the caudate nucleus on the right.  There was no intracranial mass, hemorrhage, or acute appearing infarct.  Carotid Doppler showed mild right internal carotid artery stenosis and 80-99% left internal carotid artery stenosis.  Vertebral arteries are patent with antegrade flow. The patient had CT CT angio which showed identifiable acute infarct.  No identifiable acute embolus or embolic occlusion.  Severe atherosclerotic disease of the right ICA as it passes through the skull base of the both carotid siphon regions, severe stenosis in those regions of 70% at the origin of the right anterior cerebral artery.  Severe stenosis 80% of greater of the main posterior branch of the left MCA, beyond MCA bifurcation.  The patient subsequently underwent 4-vessel  cerebral angiogram, which showed approximately 85% stenosis of the left internal carotid artery, proximally on the AP projection with approximately 70- 75% narrowing in the lateral projection.  50% stenosis of the left internal carotid artery covered with segment and 70% stenosis of the inferior division of the left middle cerebral artery.  There was high- grade stenosis of the right posterior cerebral artery P1, P2 segment with 50% stenosis proximally.  There was approximately 50% stenosis of the right internal carotid artery, and also 50% stenosis in the right middle cerebral artery of distal M1 segment.  BRIEF HOSPITAL COURSE:  The patient was admitted to telemetry unit. Neurology consultation was obtained.  The patient was empirically treated with aspirin and clopidogrel with improvement in his symptoms. The patient had again during the hospital stay mild weakness in the right upper extremity and slurred speech.  The patient subsequently underwent CT angio and then subsequently 4 vessel cerebral angiogram, which showed critical stenosis in the left internal carotid artery as above.  Interventional Radiology consultation was obtained as the patient also had prior PTA to left mid cerebral artery.  The patient showed critical stenosis in left internal carotid artery, which was felt to be the culprit lesion for his recurrent right upper arm weakness. The patient subsequently underwent PTA and stenting to the left internal carotid artery by Interventional Radiology with excellent results.  The patient did have significant drop in hemoglobin postprocedure due to hydration and blood loss during the procedure.  The patient's hemoglobin for last 2 days has been stable.  His motor strength has slightly improved in right upper extremity so as speech also has slightly improved.  The patient has bed offered at [facility].  The patient will be transferred to [facility] and will be followed up by Neurology as scheduled and with me in 1 week.     Eduardo Osier. Sharyn Lull, M.D.     M[facility]/MEDQ  D:  04/15/2013  T:  04/15/2013  Job:  [id number]

## 2013-04-15 NOTE — Progress Notes (Signed)
  CSW spoke with Pt daughter Mauri Poleasha Tisdale [phone] and she is agreeable for Pt [facility].   Pt's daughter will speak with Pt's son to see if he can go to facility to sign admission paperwork.   CSW awaiting completion of dictated D/C summary and will d/c when received.    CSW will continue to follow Pt for d/c planning.    Leron Croakassandra Kindle Strohmeier [facility]   Hospital  4N 1-16;  [phone] Phone: [phone]

## 2013-04-20 ENCOUNTER — Emergency Department (HOSPITAL_COMMUNITY): Payer: Medicare Other

## 2013-04-20 ENCOUNTER — Encounter (HOSPITAL_COMMUNITY): Payer: Self-pay | Admitting: Emergency Medicine

## 2013-04-20 ENCOUNTER — Inpatient Hospital Stay (HOSPITAL_COMMUNITY)
Admission: EM | Admit: 2013-04-20 | Discharge: 2013-04-30 | DRG: 064 | Disposition: A | Discharge status: Hospice | Payer: Medicare Other | Attending: Cardiology | Admitting: Cardiology

## 2013-04-20 DIAGNOSIS — I509 Heart failure, unspecified: Secondary | ICD-10-CM | POA: Diagnosis present

## 2013-04-20 DIAGNOSIS — Z6838 Body mass index (BMI) 38.0-38.9, adult: Secondary | ICD-10-CM

## 2013-04-20 DIAGNOSIS — R0681 Apnea, not elsewhere classified: Secondary | ICD-10-CM | POA: Diagnosis not present

## 2013-04-20 DIAGNOSIS — Z950 Presence of cardiac pacemaker: Secondary | ICD-10-CM

## 2013-04-20 DIAGNOSIS — E785 Hyperlipidemia, unspecified: Secondary | ICD-10-CM | POA: Diagnosis present

## 2013-04-20 DIAGNOSIS — R64 Cachexia: Secondary | ICD-10-CM | POA: Diagnosis present

## 2013-04-20 DIAGNOSIS — E119 Type 2 diabetes mellitus without complications: Secondary | ICD-10-CM | POA: Diagnosis present

## 2013-04-20 DIAGNOSIS — I10 Essential (primary) hypertension: Secondary | ICD-10-CM | POA: Diagnosis present

## 2013-04-20 DIAGNOSIS — I69320 Aphasia following cerebral infarction: Secondary | ICD-10-CM

## 2013-04-20 DIAGNOSIS — R0600 Dyspnea, unspecified: Secondary | ICD-10-CM

## 2013-04-20 DIAGNOSIS — R1310 Dysphagia, unspecified: Secondary | ICD-10-CM | POA: Diagnosis present

## 2013-04-20 DIAGNOSIS — S88119A Complete traumatic amputation at level between knee and ankle, unspecified lower leg, initial encounter: Secondary | ICD-10-CM

## 2013-04-20 DIAGNOSIS — I2589 Other forms of chronic ischemic heart disease: Secondary | ICD-10-CM | POA: Diagnosis present

## 2013-04-20 DIAGNOSIS — K117 Disturbances of salivary secretion: Secondary | ICD-10-CM

## 2013-04-20 DIAGNOSIS — I639 Cerebral infarction, unspecified: Secondary | ICD-10-CM

## 2013-04-20 DIAGNOSIS — I5022 Chronic systolic (congestive) heart failure: Secondary | ICD-10-CM | POA: Diagnosis present

## 2013-04-20 DIAGNOSIS — R0609 Other forms of dyspnea: Secondary | ICD-10-CM | POA: Diagnosis not present

## 2013-04-20 DIAGNOSIS — I252 Old myocardial infarction: Secondary | ICD-10-CM

## 2013-04-20 DIAGNOSIS — R0989 Other specified symptoms and signs involving the circulatory and respiratory systems: Secondary | ICD-10-CM | POA: Diagnosis not present

## 2013-04-20 DIAGNOSIS — Z7982 Long term (current) use of aspirin: Secondary | ICD-10-CM

## 2013-04-20 DIAGNOSIS — Z66 Do not resuscitate: Secondary | ICD-10-CM | POA: Diagnosis not present

## 2013-04-20 DIAGNOSIS — I739 Peripheral vascular disease, unspecified: Secondary | ICD-10-CM | POA: Diagnosis present

## 2013-04-20 DIAGNOSIS — I2510 Atherosclerotic heart disease of native coronary artery without angina pectoris: Secondary | ICD-10-CM | POA: Diagnosis present

## 2013-04-20 DIAGNOSIS — Z515 Encounter for palliative care: Secondary | ICD-10-CM

## 2013-04-20 DIAGNOSIS — R4701 Aphasia: Secondary | ICD-10-CM | POA: Diagnosis present

## 2013-04-20 DIAGNOSIS — G9341 Metabolic encephalopathy: Secondary | ICD-10-CM | POA: Diagnosis not present

## 2013-04-20 DIAGNOSIS — R29898 Other symptoms and signs involving the musculoskeletal system: Secondary | ICD-10-CM | POA: Diagnosis present

## 2013-04-20 DIAGNOSIS — Z9861 Coronary angioplasty status: Secondary | ICD-10-CM

## 2013-04-20 DIAGNOSIS — E7800 Pure hypercholesterolemia, unspecified: Secondary | ICD-10-CM | POA: Diagnosis present

## 2013-04-20 DIAGNOSIS — I69959 Hemiplegia and hemiparesis following unspecified cerebrovascular disease affecting unspecified side: Secondary | ICD-10-CM

## 2013-04-20 DIAGNOSIS — I6350 Cerebral infarction due to unspecified occlusion or stenosis of unspecified cerebral artery: Principal | ICD-10-CM | POA: Diagnosis present

## 2013-04-20 LAB — I-STAT TROPONIN, ED: Troponin i, poc: 0.04 ng/mL (ref 0.00–0.08)

## 2013-04-20 LAB — COMPREHENSIVE METABOLIC PANEL
ALBUMIN: 3.5 g/dL (ref 3.5–5.2)
ALK PHOS: 134 U/L — AB (ref 39–117)
ALT: 17 U/L (ref 0–53)
AST: 26 U/L (ref 0–37)
BUN: 16 mg/dL (ref 6–23)
CO2: 26 mEq/L (ref 19–32)
Calcium: 9.7 mg/dL (ref 8.4–10.5)
Chloride: 100 mEq/L (ref 96–112)
Creatinine, Ser: 1.06 mg/dL (ref 0.50–1.35)
GFR calc Af Amer: 74 mL/min — ABNORMAL LOW (ref >90)
GFR calc non Af Amer: 64 mL/min — ABNORMAL LOW (ref >90)
Glucose, Bld: 130 mg/dL — ABNORMAL HIGH (ref 70–99)
POTASSIUM: 4 meq/L (ref 3.7–5.3)
Sodium: 140 mEq/L (ref 137–147)
TOTAL PROTEIN: 7.7 g/dL (ref 6.0–8.3)
Total Bilirubin: 0.4 mg/dL (ref 0.3–1.2)

## 2013-04-20 LAB — DIFFERENTIAL
BASOS ABS: 0 10*3/uL (ref 0.0–0.1)
BASOS PCT: 0 % (ref 0–1)
Eosinophils Absolute: 0.1 10*3/uL (ref 0.0–0.7)
Eosinophils Relative: 2 % (ref 0–5)
Lymphocytes Relative: 12 % (ref 12–46)
Lymphs Abs: 0.9 10*3/uL (ref 0.7–4.0)
Monocytes Absolute: 0.4 10*3/uL (ref 0.1–1.0)
Monocytes Relative: 5 % (ref 3–12)
NEUTROS PCT: 81 % — AB (ref 43–77)
Neutro Abs: 6.3 10*3/uL (ref 1.7–7.7)

## 2013-04-20 LAB — CBC
HCT: 30.3 % — ABNORMAL LOW (ref 39.0–52.0)
HEMOGLOBIN: 10.1 g/dL — AB (ref 13.0–17.0)
MCH: 28.7 pg (ref 26.0–34.0)
MCHC: 33.3 g/dL (ref 30.0–36.0)
MCV: 86.1 fL (ref 78.0–100.0)
Platelets: 297 10*3/uL (ref 150–400)
RBC: 3.52 MIL/uL — ABNORMAL LOW (ref 4.22–5.81)
RDW: 16.4 % — AB (ref 11.5–15.5)
WBC: 7.8 10*3/uL (ref 4.0–10.5)

## 2013-04-20 LAB — APTT: APTT: 25 s (ref 24–37)

## 2013-04-20 LAB — PROTIME-INR
INR: 1.06 (ref 0.00–1.49)
PROTHROMBIN TIME: 13.6 s (ref 11.6–15.2)

## 2013-04-20 NOTE — ED Notes (Signed)
Pt last known well last evening.  Family visited at [id number] and noted facial drooping.  Not speaking to family or looking at them.

## 2013-04-20 NOTE — ED Notes (Signed)
Family at bedside. 

## 2013-04-20 NOTE — ED Notes (Signed)
Found tonight by family to have right sided facial droop, weak or no use right arm.  Drooling noted to right.  Pt has hx CVA but this is new.  Last seen well maybe last evening.  Nursing home was unable to give much information.  Family insisted pt be transported to ed.

## 2013-04-20 NOTE — ED Notes (Signed)
Patient transported to CT 

## 2013-04-21 ENCOUNTER — Other Ambulatory Visit: Payer: Self-pay

## 2013-04-21 ENCOUNTER — Inpatient Hospital Stay (HOSPITAL_COMMUNITY): Payer: Medicare Other

## 2013-04-21 ENCOUNTER — Emergency Department (HOSPITAL_COMMUNITY): Payer: Medicare Other

## 2013-04-21 DIAGNOSIS — I69320 Aphasia following cerebral infarction: Secondary | ICD-10-CM

## 2013-04-21 DIAGNOSIS — I6350 Cerebral infarction due to unspecified occlusion or stenosis of unspecified cerebral artery: Principal | ICD-10-CM

## 2013-04-21 DIAGNOSIS — I69920 Aphasia following unspecified cerebrovascular disease: Secondary | ICD-10-CM

## 2013-04-21 LAB — URINALYSIS, ROUTINE W REFLEX MICROSCOPIC
Bilirubin Urine: NEGATIVE
Glucose, UA: NEGATIVE mg/dL
Hgb urine dipstick: NEGATIVE
Ketones, ur: NEGATIVE mg/dL
LEUKOCYTES UA: NEGATIVE
Nitrite: NEGATIVE
PROTEIN: NEGATIVE mg/dL
Specific Gravity, Urine: 1.02 (ref 1.005–1.030)
Urobilinogen, UA: 0.2 mg/dL (ref 0.0–1.0)
pH: 5 (ref 5.0–8.0)

## 2013-04-21 LAB — CBC
HEMATOCRIT: 29.9 % — AB (ref 39.0–52.0)
Hemoglobin: 10 g/dL — ABNORMAL LOW (ref 13.0–17.0)
MCH: 28.7 pg (ref 26.0–34.0)
MCHC: 33.4 g/dL (ref 30.0–36.0)
MCV: 85.9 fL (ref 78.0–100.0)
PLATELETS: 266 10*3/uL (ref 150–400)
RBC: 3.48 MIL/uL — AB (ref 4.22–5.81)
RDW: 16.8 % — ABNORMAL HIGH (ref 11.5–15.5)
WBC: 7.5 10*3/uL (ref 4.0–10.5)

## 2013-04-21 LAB — GLUCOSE, CAPILLARY: Glucose-Capillary: 118 mg/dL — ABNORMAL HIGH (ref 70–99)

## 2013-04-21 LAB — CREATININE, SERUM
Creatinine, Ser: 1.01 mg/dL (ref 0.50–1.35)
GFR, EST AFRICAN AMERICAN: 79 mL/min — AB (ref >90)
GFR, EST NON AFRICAN AMERICAN: 68 mL/min — AB (ref >90)

## 2013-04-21 LAB — I-STAT CG4 LACTIC ACID, ED: Lactic Acid, Venous: 1.15 mmol/L (ref 0.5–2.2)

## 2013-04-21 MED ORDER — NITROGLYCERIN 2 % TD OINT
1.0000 [in_us] | TOPICAL_OINTMENT | Freq: Four times a day (QID) | TRANSDERMAL | Status: DC
  Administered 2013-04-21 – 2013-04-26 (×20): 1 [in_us] via TOPICAL
  Filled 2013-04-21: qty 30

## 2013-04-21 MED ORDER — ASPIRIN 300 MG RE SUPP
300.0000 mg | Freq: Every day | RECTAL | Status: DC
  Administered 2013-04-21: 300 mg via RECTAL
  Filled 2013-04-21 (×2): qty 1

## 2013-04-21 MED ORDER — SODIUM CHLORIDE 0.9 % IV SOLN
INTRAVENOUS | Status: DC
  Administered 2013-04-21 – 2013-04-24 (×4): via INTRAVENOUS

## 2013-04-21 MED ORDER — HEPARIN SODIUM (PORCINE) 5000 UNIT/ML IJ SOLN
5000.0000 [IU] | Freq: Three times a day (TID) | INTRAMUSCULAR | Status: DC
  Administered 2013-04-21 – 2013-04-26 (×15): 5000 [IU] via SUBCUTANEOUS
  Filled 2013-04-21 (×18): qty 1

## 2013-04-21 MED ORDER — ASPIRIN EC 81 MG PO TBEC
81.0000 mg | DELAYED_RELEASE_TABLET | Freq: Every day | ORAL | Status: DC
  Filled 2013-04-21: qty 1

## 2013-04-21 MED ORDER — SENNOSIDES-DOCUSATE SODIUM 8.6-50 MG PO TABS
1.0000 | ORAL_TABLET | Freq: Every evening | ORAL | Status: DC | PRN
  Filled 2013-04-21: qty 1

## 2013-04-21 MED ORDER — PANTOPRAZOLE SODIUM 40 MG IV SOLR
40.0000 mg | Freq: Every day | INTRAVENOUS | Status: DC
  Administered 2013-04-21 – 2013-04-25 (×5): 40 mg via INTRAVENOUS
  Filled 2013-04-21 (×6): qty 40

## 2013-04-21 MED ORDER — CLOPIDOGREL BISULFATE 75 MG PO TABS
75.0000 mg | ORAL_TABLET | Freq: Every day | ORAL | Status: DC
  Administered 2013-04-21 – 2013-04-25 (×5): 75 mg via ORAL
  Filled 2013-04-21 (×8): qty 1

## 2013-04-21 MED ORDER — IOHEXOL 350 MG/ML SOLN
100.0000 mL | Freq: Once | INTRAVENOUS | Status: AC | PRN
  Administered 2013-04-21: 100 mL via INTRAVENOUS

## 2013-04-21 MED ORDER — LORAZEPAM 2 MG/ML IJ SOLN
1.0000 mg | Freq: Once | INTRAMUSCULAR | Status: AC
  Administered 2013-04-21: 1 mg via INTRAVENOUS
  Filled 2013-04-21: qty 1

## 2013-04-21 MED ORDER — LORAZEPAM 2 MG/ML IJ SOLN
1.0000 mg | Freq: Once | INTRAMUSCULAR | Status: AC
  Administered 2013-04-21: 1 mg via INTRAVENOUS

## 2013-04-21 NOTE — Progress Notes (Signed)
VASCULAR LAB PRELIMINARY  PRELIMINARY  PRELIMINARY  PRELIMINARY  Carotid duplex  completed.    Preliminary report:  Bilateral:  1-39% ICA stenosis.  Vertebral artery flow is antegrade.   Left ICA stent is patent.  No evidence of thrombus.    Shakema Surita, RVT 04/21/2013, 10:52 AM

## 2013-04-21 NOTE — ED Notes (Signed)
Patient transported to CT 

## 2013-04-21 NOTE — Consult Note (Addendum)
Neurology Consultation Reason for Consult: Stroke Referring Physician: Dierdre Highmanpitz, B  CC: Aphasia  History is obtained from:Nursing home staff, family.   HPI: [name] is a 80 y.o. male with a history of recent stroke in March who underwent a left carotid stent placement at that time. He has been on dual antiplatelet therapy. He was noticed to have a change around 1pm today reported as "increased confusion." Around 8pm, his family arrived and noticed that he was not talking and therefore insisted he be brought to the emergency room.     LKW: unclear if he was seen talking this morning, he was noticed abnormal around 1pm, but whether talking prior to this or not is unclear.  tpa given?: no, recent stroke, outside of window.     ROS: Unable to assess secondary to patient's altered mental status.    Past Medical History  Diagnosis Date  . Hypercholesteremia     takes Lipitor daily  . CAD (coronary artery disease)     s/p PTCA to PLV branch in 2002  . Non-insulin dependent diabetes mellitus   . PVD (peripheral vascular disease)     s/p right BKA  . CVA (cerebral infarction)     s/p PTA to middle cerebral artery;slurred speech  . HTN (hypertension)     takes Benazepril daily  . Pacemaker   . Shortness of breath     sitting as well as standing  . Glaucoma   . Complete heart block     s/p Medtronic PPM by JA 09/2009  . Stroke     Family History: Unable to assess secondary to patient's altered mental status.    Social History: Tob: Unable to assess secondary to patient's altered mental status.    Exam: Current vital signs: BP 188/76  Pulse 68  Temp(Src) 98.6 F (37 C) (Oral)  Resp 19  Ht 4\' 6"  (1.372 m)  Wt 72.576 kg (160 lb)  BMI 38.56 kg/m2  SpO2 100% Vital signs in last 24 hours: Temp:  [98.6 F (37 C)] 98.6 F (37 C) (04/05 2223) Pulse Rate:  [63-68] 68 (04/06 0123) Resp:  [10-19] 19 (04/06 0123) BP: (113-188)/(51-85) 188/76 mmHg (04/06 0123) SpO2:  [98  %-100 %] 100 % (04/06 0123) Weight:  [72.576 kg (160 lb)] 72.576 kg (160 lb) (04/05 2223)  General: in bed, NAD CV: RRR Mental Status: Patient is awake, alert, he does not follow any commands or answer any questions.  Cranial Nerves: II: Difficult to assess as patient holds eyes tightly closed throughout interview. Pupils are equal, round, and reactive to light.  Discs are difficult to visualize III,IV, VI: Appears to have a left gaze preference.  V: responds to stim on both sides.  VII: Facial movement is notable for right facial drooping.  VIII, X, XI, XII: Unable to assess secondary to patient's altered mental status.  Motor: Tone is normal. Bulk is normal. 5/5 strength was present on the left. He has a spastic paresis of the right upper extremity, his right leg also appears 4/5.  Sensory: Does not appear to responde Deep Tendon Reflexes: 2+ and symmetric in the biceps  Cerebellar: Does not comply Gait: Bilateral amputee.     I have reviewed labs in epic and the results pertinent to this consultation are: cmp - normal creatinine Recent LDL 63  I have reviewed the images obtained:CT head - small stroke in the left MCA distribution CTA shows distal flow to the stent which would be consistent with patency, an  ultrasound could better distinguish if there is any in stent thrombosis. This study is markedly limited due to motion.   Impression: 80 yo M with recent infarct now presenting with newt left MCA territory infarct in the setting of recent stent placement. He is outside the window for intervention and CTA shows flow distal to the stent. An ultrasound could better characterize if there is any non-occlusive thrombus within the stent that could serve as a source of embolus.   Recommendations: 1) Carotid ultrasound.  2) continue ASA and plavix.  3) Echo recently performed, shows low EF, no need to repeat.  4) PT, OT, ST 5) I have lipid panel, a1c, please reorder if desired from  medical perspective.  6) will continue to follow.     Ritta Slot, MD [facility] [phone]  If 7pm- 7am, please page neurology on call as listed in AMION.

## 2013-04-21 NOTE — H&P (Signed)
[name] is an 80 y.o. male.   Chief Complaint: Aphasia with right-sided facial droop HPI: Patient is 80 year old male with past medical history significant for multiple medical problems i.e. coronary artery disease history of MI in the past ischemic cardiomyopathy, history of complete heart block status post permanent pacemaker in the past history of CVA in the past had PTCA to mid cerebral artery many years ago and recently had the PTA and stenting to left mid cerebral artery, hypertension, non-insulin-dependent diabetes mellitus controlled by diet,  severe peripheral vascular disease status post bilateral below-knee amputation in the past, hypercholesteremia, recently discharged from the hospital the following probable recurrent CVA versus reversible ischemic neurological deficit requiring PTA/ stenting to left internal carotid artery artery resident of skilled nursing facility was transferred to [facility] as patient was noted to have increasing confusion right facial droop and aphasia noted by the family around 8 PM last night. Patient as per family did not eat his lunch yesterday as patient was confused but was able to take his medication. Family noted patient to be aphasic with right facial droop and weakness in the right upper extremity and was transferred to [facility] for further management. Patient was felt not to be candidate for emergency percutaneous intervention or thrombolytic therapy due to delay in arriving to the hospital. CT of the brain showed new subtle low-attenuation left frontoparietal MCA territory acute infarct.  Past Medical History  Diagnosis Date  . Hypercholesteremia     takes Lipitor daily  . CAD (coronary artery disease)     s/p PTCA to PLV branch in 2002  . Non-insulin dependent diabetes mellitus   . PVD (peripheral vascular disease)     s/p right BKA  . CVA (cerebral infarction)     s/p PTA to middle cerebral artery;slurred speech  . HTN  (hypertension)     takes Benazepril daily  . Pacemaker   . Shortness of breath     sitting as well as standing  . Glaucoma   . Complete heart block     s/p Medtronic PPM by JA 09/2009  . Stroke     Past Surgical History  Procedure Laterality Date  . Pacemaker insertion  09/2010  . Appendectomy    . Amputation      below amputation  . Cardiac catheterization      2002/2009  . Amputation  08/02/2011    Procedure: AMPUTATION BELOW KNEE;  Surgeon: Rosetta Posner, MD;  Location: [place];  Service: Vascular;  Laterality: Left;  . Insert / replace / remove pacemaker    . Radiology with anesthesia N/A 04/11/2013    Procedure: RADIOLOGY WITH ANESTHESIA;  Surgeon: Rob Hickman, MD;  Location: [place];  Service: Radiology;  Laterality: N/A;  LEFT CAROTID STENT    Family History  Problem Relation Age of Onset  . Heart failure Mother   . Heart failure Father   . Prostate cancer Brother    Social History:  reports that he has never smoked. He has never used smokeless tobacco. He reports that he does not drink alcohol or use illicit drugs.  Allergies: No Known Allergies   (Not in a hospital admission)  Results for orders placed during the hospital encounter of 04/20/13 (from the past 48 hour(s))  PROTIME-INR     Status: None   Collection Time    04/20/13 10:49 PM      Result Value Ref Range   Prothrombin Time 13.6  11.6 -  15.2 seconds   INR 1.06  0.00 - 1.49  APTT     Status: None   Collection Time    04/20/13 10:49 PM      Result Value Ref Range   aPTT 25  24 - 37 seconds  CBC     Status: Abnormal   Collection Time    04/20/13 10:49 PM      Result Value Ref Range   WBC 7.8  4.0 - 10.5 K/uL   RBC 3.52 (*) 4.22 - 5.81 MIL/uL   Hemoglobin 10.1 (*) 13.0 - 17.0 g/dL   HCT 30.3 (*) 39.0 - 52.0 %   MCV 86.1  78.0 - 100.0 fL   MCH 28.7  26.0 - 34.0 pg   MCHC 33.3  30.0 - 36.0 g/dL   RDW 16.4 (*) 11.5 - 15.5 %   Platelets 297  150 - 400 K/uL  DIFFERENTIAL     Status: Abnormal    Collection Time    04/20/13 10:49 PM      Result Value Ref Range   Neutrophils Relative % 81 (*) 43 - 77 %   Neutro Abs 6.3  1.7 - 7.7 K/uL   Lymphocytes Relative 12  12 - 46 %   Lymphs Abs 0.9  0.7 - 4.0 K/uL   Monocytes Relative 5  3 - 12 %   Monocytes Absolute 0.4  0.1 - 1.0 K/uL   Eosinophils Relative 2  0 - 5 %   Eosinophils Absolute 0.1  0.0 - 0.7 K/uL   Basophils Relative 0  0 - 1 %   Basophils Absolute 0.0  0.0 - 0.1 K/uL  COMPREHENSIVE METABOLIC PANEL     Status: Abnormal   Collection Time    04/20/13 10:49 PM      Result Value Ref Range   Sodium 140  137 - 147 mEq/L   Potassium 4.0  3.7 - 5.3 mEq/L   Chloride 100  96 - 112 mEq/L   CO2 26  19 - 32 mEq/L   Glucose, Bld 130 (*) 70 - 99 mg/dL   BUN 16  6 - 23 mg/dL   Creatinine, Ser 1.06  0.50 - 1.35 mg/dL   Calcium 9.7  8.4 - 10.5 mg/dL   Total Protein 7.7  6.0 - 8.3 g/dL   Albumin 3.5  3.5 - 5.2 g/dL   AST 26  0 - 37 U/L   ALT 17  0 - 53 U/L   Alkaline Phosphatase 134 (*) 39 - 117 U/L   Total Bilirubin 0.4  0.3 - 1.2 mg/dL   GFR calc non Af Amer 64 (*) >90 mL/min   GFR calc Af Amer 74 (*) >90 mL/min   Comment: (NOTE)     The eGFR has been calculated using the CKD EPI equation.     This calculation has not been validated in all clinical situations.     eGFR's persistently <90 mL/min signify possible Chronic Kidney     Disease.  [name], ED     Status: None   Collection Time    04/20/13 10:55 PM      Result Value Ref Range   Troponin i, poc 0.04  0.00 - 0.08 ng/mL   Comment 3            Comment: Due to the release kinetics of cTnI,     a negative result within the first hours     of the onset of symptoms does not rule out  myocardial infarction with certainty.     If myocardial infarction is still suspected,     repeat the test at appropriate intervals.   Ct Head (brain) Wo Contrast  04/20/2013   CLINICAL DATA:  Right-sided facial droop and right hemiparesis.  EXAM: CT HEAD WITHOUT CONTRAST   TECHNIQUE: Contiguous axial images were obtained from the base of the skull through the vertex without intravenous contrast.  COMPARISON:  04/14/2012  FINDINGS: Mild motion artifact is present despite multiple repeated attempts. Ventricles and cisterns are within normal. There is age related atrophic change present. Chronic ischemic microvascular disease present. There is mild low-attenuation with subtle local mass effect over the left frontal parietal region in the MCA territory suggesting acute infarction. No focal mass or midline shift. No evidence of acute hemorrhage. Remainder the exam is unremarkable.  IMPRESSION: Subtle low-attenuation with mild local mass effect over the left MCA territory of the frontal parietal region suggesting acute infarction. No evidence of acute hemorrhage.  Chronic ischemic microvascular disease and age related atrophy.   Electronically Signed   By: Marin Olp M.D.   On: 04/20/2013 23:58   Dg Chest Portable 1 View  04/21/2013   CLINICAL DATA:  CVA.  EXAM: PORTABLE CHEST - 1 VIEW  COMPARISON:  Chest radiograph performed 04/03/2013  FINDINGS: The lungs are well-aerated and clear. There is no evidence of focal opacification, pleural effusion or pneumothorax.  The cardiomediastinal silhouette is within normal limits. A pacemaker is noted overlying the left chest wall, with leads ending overlying the right atrium and right ventricle. Clips are noted overlying the left axilla. No acute osseous abnormalities are seen.  IMPRESSION: No acute cardiopulmonary process seen.   Electronically Signed   By: Garald Balding M.D.   On: 04/21/2013 00:56    Review of Systems  Unable to perform ROS: mental acuity    Blood pressure 128/85, pulse 63, temperature 98.6 F (37 C), temperature source Oral, resp. rate 16, height _0  (1.372 m), weight 72.576 kg (160 lb), SpO2 100.00%. Physical Exam  HENT:  Head: Normocephalic and atraumatic.  Eyes: Conjunctivae are normal. Pupils are equal,  round, and reactive to light. Left eye exhibits no discharge. No scleral icterus.  Neck: Normal range of motion. Neck supple. No JVD present. No tracheal deviation present. Thyromegaly present.  Cardiovascular: Normal rate and regular rhythm.   Murmur (Soft systolic murmur noted no S3 gallop) heard. Respiratory: Effort normal and breath sounds normal. No respiratory distress. He has no wheezes. He has no rales.  GI: Soft. Bowel sounds are normal. He exhibits no distension. There is no tenderness. There is no rebound.  Musculoskeletal:  Bilateral below-knee amputation  Neurological:  Awake aphasic does not follow any commands Right upper extremity motor strength 0/5, left upper extremity 5 over 5. Lower extremity bilateral BKA     Assessment/Plan Acute left frontal parietal MCA territory infarct probably do to occlusion of right ICA stent History of recurrent CVA in the past Coronary artery disease history of MI in the past status post PCI to RCA in the past Hypertension Ischemic cardiomyopathy Compensated systolic heart failure Diabetes mellitus controlled by diet Hypercholesteremia Peripheral vascular disease status post bilateral BKA in past Plan As per orders Neurologic consult Interventional radiology consult. [name] N 04/21/2013, 1:16 AM

## 2013-04-21 NOTE — ED Notes (Signed)
Report to Denise RN on 3W 

## 2013-04-21 NOTE — Progress Notes (Signed)
SLP Cancellation Note  Patient Details Name: Conchita ParisBilly Anchondo MRN: [id number] DOB: [birth year]   Cancelled treatment:        Attempted to see pt. For bedside swallow eval.  Pt. Is currently lethargic and will not arouse sufficiently for po attempts.  RN agrees.  Pt. Now has a panda tube in place.  SLP will reattempt 4/7.   [name], Ahliyah Nienow T 04/21/2013, 4:50 PM

## 2013-04-21 NOTE — ED Notes (Signed)
Pt returned from CT.  IV fluids reconnected to pt.

## 2013-04-21 NOTE — Progress Notes (Signed)
INITIAL NUTRITION ASSESSMENT  DOCUMENTATION CODES Per approved criteria  -Obesity Unspecified   INTERVENTION: - Diet advancement per SLP/MD - RD to continue to monitor   NUTRITION DIAGNOSIS: Inadequate oral intake related to inability to eat as evidenced by NPO.   Goal: Advance diet as tolerated to diet per SLP/MD  Monitor:  Weights, labs, diet advancement  Reason for Assessment: Low braden  80 y.o. male  Admitting Dx: Aphasia with right sided facial droop   ASSESSMENT: Pt with history of coronary artery disease history of MI in the past ischemic cardiomyopathy, history of complete heart block status post permanent pacemaker in the past history of CVA in the past, hypertension, non-insulin-dependent diabetes mellitus controlled by diet, severe peripheral vascular disease status post bilateral below-knee amputation, hypercholesteremia, recently discharged from the hospital last month following probable recurrent CVA versus reversible ischemic neurological deficit. Admitted with increasing confusion right facial droop and aphasia noted by the family around 8 PM last night.   Pt alone in room. Per H&P, family noted pt did not eat his lunch yesterday and was confused. PO intake during admission last month was mostly 70-75% of meals. CT of the brain showed new subtle low-attenuation left frontoparietal MCA territory acute infarct per MD notes. SLP following. Pt without visible signs of malnutrition on upper body.   Alk phos elevated    Height: Ht Readings from Last 1 Encounters:  04/20/13 _0  (1.372 m)  Original ht _1  (172.7 cm) per past medical records  Weight: Wt Readings from Last 1 Encounters:  04/21/13 152 lb (68.947 kg)    Ideal Body Weight: 134 lbs - adjusted for bilateral BKA   % Ideal Body Weight: 113%   Wt Readings from Last 10 Encounters:  04/21/13 152 lb (68.947 kg)  04/12/13 165 lb (74.844 kg)  04/12/13 165 lb (74.844 kg)  08/30/11 150 lb (68.04 kg)   08/15/11 150 lb (68.04 kg)  08/01/11 158 lb 11.7 oz (72 kg)  08/01/11 158 lb 11.7 oz (72 kg)  07/18/11 175 lb (79.379 kg)  07/12/11 175 lb (79.379 kg)  07/07/11 174 lb (78.926 kg)    Usual Body Weight: 165 lbs last month  % Usual Body Weight: 92%  BMI:  Body mass index is 36.63 kg/(m^2). Class II obesity  Estimated Nutritional Needs: Kcal: [id number] Protein: 60-75g Fluid: 1.5-1.7L/day  Skin: Bilateral BKA   Diet Order: NPO  EDUCATION NEEDS: -No education needs identified at this time   Intake/Output Summary (Last 24 hours) at 04/21/13 1621 Last data filed at 04/21/13 1500  Gross per 24 hour  Intake 1275.42 ml  Output      0 ml  Net 1275.42 ml    Last BM: PTA  Labs:   Recent Labs Lab 04/15/13 0918 04/20/13 2249 04/21/13 0617  NA 140 140  --   K 4.1 4.0  --   CL 106 100  --   CO2 21 26  --   BUN 14 16  --   CREATININE 1.03 1.06 1.01  CALCIUM 9.2 9.7  --   GLUCOSE 155* 130*  --     CBG (last 3)   Recent Labs  04/21/13 0738  GLUCAP 118*    Scheduled Meds: . aspirin  300 mg Rectal Daily  . clopidogrel  75 mg Oral Q breakfast  . heparin  5,000 Units Subcutaneous 3 times per day  . nitroGLYCERIN  1 inch Topical 4 times per day  . pantoprazole (PROTONIX) IV  40 mg Intravenous  QHS    Continuous Infusions: . sodium chloride 50 mL/hr at 04/21/13 1500    Past Medical History  Diagnosis Date  . Hypercholesteremia     takes Lipitor daily  . CAD (coronary artery disease)     s/p PTCA to PLV branch in 2002  . Non-insulin dependent diabetes mellitus   . PVD (peripheral vascular disease)     s/p right BKA  . CVA (cerebral infarction)     s/p PTA to middle cerebral artery;slurred speech  . HTN (hypertension)     takes Benazepril daily  . Pacemaker   . Shortness of breath     sitting as well as standing  . Glaucoma   . Complete heart block     s/p Medtronic PPM by JA 09/2009  . Stroke     Past Surgical History  Procedure Laterality Date   . Pacemaker insertion  09/2010  . Appendectomy    . Amputation      below amputation  . Cardiac catheterization      2002/2009  . Amputation  08/02/2011    Procedure: AMPUTATION BELOW KNEE;  Surgeon: Rosetta Posner, MD;  Location: [place];  Service: Vascular;  Laterality: Left;  . Insert / replace / remove pacemaker    . Radiology with anesthesia N/A 04/11/2013    Procedure: RADIOLOGY WITH ANESTHESIA;  Surgeon: Rob Hickman, MD;  Location: [place];  Service: Radiology;  Laterality: N/A;  LEFT CAROTID STENT    Mikey College MS, [place], [place] Pager [phone] After Hours Pager

## 2013-04-21 NOTE — Progress Notes (Signed)
Stroke Team Progress Note  HISTORY [name] is a 80 y.o. male with a history of recent right brain stroke in March who underwent a left carotid stent placement following his rehab stay. He has been on dual antiplatelet therapy. He was noticed to have a change around 1pm today 04/20/2013 reported as "increased confusion." Around 8pm, his family arrived and noticed that he was not talking and therefore insisted he be brought to the emergency room. Patient was not administered TPA secondary to recent stroke, outside of window. He was admitted for further evaluation and treatment.  SUBJECTIVE No famiy is at the bedside.  Overall, the patient is not responsive.  OBJECTIVE Most recent Vital Signs: Filed Vitals:   04/21/13 0234 04/21/13 0330 04/21/13 0400 04/21/13 0500  BP: 178/101 148/64 142/68 167/111  Pulse: 73 84 81 78  Temp:    98.4 F (36.9 C)  TempSrc:      Resp: 29  22 16   Height:      Weight:    68.947 kg (152 lb)  SpO2: 100% 100% 97% 100%   CBG (last 3)   Recent Labs  04/21/13 0738  GLUCAP 118*    IV Fluid Intake:   . sodium chloride 125 mL/hr at 04/21/13 0231    MEDICATIONS  . aspirin EC  81 mg Oral Daily  . clopidogrel  75 mg Oral Q breakfast  . heparin  5,000 Units Subcutaneous 3 times per day  . nitroGLYCERIN  1 inch Topical 4 times per day  . pantoprazole (PROTONIX) IV  40 mg Intravenous QHS   PRN:  senna-docusate  Diet:  NPO  Activity:   DVT Prophylaxis:  Heparin 5000 units sq tid   CLINICALLY SIGNIFICANT STUDIES Basic Metabolic Panel:  Recent Labs Lab 04/15/13 0918 04/20/13 2249 04/21/13 0617  NA 140 140  --   K 4.1 4.0  --   CL 106 100  --   CO2 21 26  --   GLUCOSE 155* 130*  --   BUN 14 16  --   CREATININE 1.03 1.06 1.01  CALCIUM 9.2 9.7  --    Liver Function Tests:  Recent Labs Lab 04/20/13 2249  AST 26  ALT 17  ALKPHOS 134*  BILITOT 0.4  PROT 7.7  ALBUMIN 3.5   CBC:  Recent Labs Lab 04/20/13 2249 04/21/13 0617  WBC 7.8 7.5   NEUTROABS 6.3  --   HGB 10.1* 10.0*  HCT 30.3* 29.9*  MCV 86.1 85.9  PLT 297 266   Coagulation:  Recent Labs Lab 04/20/13 2249  LABPROT 13.6  INR 1.06   Cardiac Enzymes: No results found for this basename: CKTOTAL, CKMB, CKMBINDEX, TROPONINI,  in the last 168 hours Urinalysis:  Recent Labs Lab 04/21/13 0253  COLORURINE YELLOW  LABSPEC 1.020  PHURINE 5.0  GLUCOSEU NEGATIVE  HGBUR NEGATIVE  BILIRUBINUR NEGATIVE  KETONESUR NEGATIVE  PROTEINUR NEGATIVE  UROBILINOGEN 0.2  NITRITE NEGATIVE  LEUKOCYTESUR NEGATIVE   Lipid Panel    Component Value Date/Time   CHOL 125 04/04/2013 0208   TRIG 49 04/04/2013 0208   HDL 52 04/04/2013 0208   CHOLHDL 2.4 04/04/2013 0208   VLDL 10 04/04/2013 0208   LDLCALC 63 04/04/2013 0208   HgbA1C  Lab Results  Component Value Date   HGBA1C 6.8* 04/04/2013    Urine Drug Screen:     Component Value Date/Time   LABOPIA NONE DETECTED 04/03/2013 0936   COCAINSCRNUR NONE DETECTED 04/03/2013 0936   LABBENZ NONE DETECTED 04/03/2013 0936  AMPHETMU NONE DETECTED 04/03/2013 0936   THCU NONE DETECTED 04/03/2013 0936   LABBARB NONE DETECTED 04/03/2013 0936    Alcohol Level: No results found for this basename: ETH,  in the last 168 hours   CT of the brain  04/20/2013   Subtle low-attenuation with mild local mass effect over the left MCA territory of the frontal parietal region suggesting acute infarction. No evidence of acute hemorrhage.  Chronic ischemic microvascular disease and age related atrophy.     CT angio of the brain  04/21/2013   1. Markedly limited study due to motion. There is question of abrupt occlusion of the mid left M1 segment. Flow within the distal left MCA branches is somewhat attenuated, and may be via leptomeningeal collateralization. 2. Acute ischemic left MCA territory infarct involving the left frontoparietal region, stable relative to prior CT performed earlier. 3. No other proximal branch occlusion, or definite high-grade flow-limiting  stenosis identified within the intracranial circulation.    CT angio of the neck  04/21/2013     1. Markedly limited study due to motion. There is flow within the left internal carotid artery distal to the carotid stent, with flow within the stent itself not well evaluated on this examination due to extensive motion. Further evaluation with carotid Doppler ultrasound and/or catheter directed angiogram to evaluate stent patency recommended. 2. No other high-grade flow-limiting stenosis, occlusion, or dissection identified within the neck. 3. Enlarged multinodular goiter.   MRI/A of the brain  pacer  2D Echocardiogram  04/04/2013  Ejection fraction 25-30%. No cardiac source of emboli was identified.  Carotid Doppler  Bilateral: 1-39% ICA stenosis. Vertebral artery flow is antegrade. Left ICA stent is patent. No evidence of thrombus.  CXR  04/21/2013    No acute cardiopulmonary process seen.     EKG  normal sinus rhythm. For complete results please see formal report.   Therapy Recommendations   Physical Exam   Frail cachectic african [place] male not in distress. . Afebrile. Head is nontraumatic. Neck is supple without bruit.  . Cardiac exam no murmur or gallop. Lungs are clear to auscultation. Distal pulses are not felt in LE due to bilateral BKA Neurological Exam : . Stuporous and can barely be aroused. Eyes are closed. Will not follow commands. Not speaking. Forced left gaze deviation. Pupils irregular but reactive. Doll's eye movement absent. Right lower facial weakness. Tongue midline. Dense right hemiplegia with decreased tone. Able to move left upper and lower extremity against gravity to sternal rub. Normal tone on the left. Plantars cannot be checked. ASSESSMENT [name] is a 80 y.o. male presenting from the NH with aphasia. Imaging confirms a new left brain infarct, appears large. In patient with incidental L ICA stenosis with stent placed 04/11/2013. Patient with right brain stroke  03/25/2013. aspirin 81 mg orally every day and clopidogrel 75 mg orally every day prior to admission. Now on aspirin 81 mg orally every day and clopidogrel 75 mg orally every day for secondary stroke prevention, though patient unable to swallow at this time. Patient with resultant stuporous state, right hemiparesis, global aphasia, dysphagia; he received 2 mg ativan during the night for CT imaging.   Right brain stroke 03/25/2013, dysarthria and left hemiparesis, discharged to rehabilitation Prior stroke Hyperlipidemia, LDL 63 - on Lipitor prior to admission  Permanent pacemaker  Coronary artery disease  Bilateral lower extremity amputations  Hypertension  Left ventricular dysfunction - ejection fraction 25-30% by 2-D echo  Hospital day # 1  TREATMENT/PLAN  Transfer to step down unit to monitor her neurologic status. Patient at risk for neurologic worsening given the expected large size of stroke. This was discussed with Dr. Algie Coffer who is at the bedside, also with Dr. Sharyn Lull via telephone.  Continue aspirin 81 mg orally every day and clopidogrel 75 mg orally every day for secondary stroke prevention once able to swallow. For now, change to aspirin 300 mg suppository daily.  Need to discuss patient's diagnosis and prognosis with the family in order to determine future plan of care  Canceled 2-D echocardiogram given one done within the past month  Out of bed with therapy evaluations once patient able  Consider panda and tube feedings if patient does not improve and family would like full care  D/W patient`s sister, Dr Twanna Hy and Eugenia Pancoast  Check TCD to evaluate left MCA focal stenosis noted on CTA  Annie Main, MSN, RN, ANVP-BC, AGPCNP-BC [facility] Pager: [phone] 04/21/2013 8:40 AM This patient is critically ill and at significant risk of neurological worsening, death and care requires constant monitoring of vital signs, hemodynamics,respiratory and cardiac  monitoring,review of multiple databases, neurological assessment, discussion with family, other specialists and medical decision making of high complexity. I spent 30 minutes of neurocritical care time  in the care of  this patient. I have personally obtained a history, examined the patient, evaluated imaging results, and formulated the assessment and plan of care. I agree with the above. Delia Heady, MD   To contact Stroke Continuity provider, please refer to [email or web address]. After hours, contact General Neurology

## 2013-04-21 NOTE — Progress Notes (Signed)
[facility] on [id number]; pt transferred to [id number], pt remained in stable condition, pts family made aware

## 2013-04-21 NOTE — ED Provider Notes (Signed)
CSN: [id number]     Arrival date & time 04/20/13  2214 History   First MD Initiated Contact with Patient 04/20/13 2252     Chief Complaint  Patient presents with  . Cerebrovascular Accident     (Consider location/radiation/quality/duration/timing/severity/associated sxs/prior Treatment) HPI History provided by family bedside. Recent stroke discharge from the hospital to skilled nursing. Presented with slurred speech and right-sided deficits. Family reports that speech improved and is undergoing PT /OT.  Daughter went to nursing home for this early evening and found patient is aphasic with recurrent right facial droop and worried that he may have had another stroke.  Family reports that per the nursing home, decreased appetite today and unable to say when the last time he spoke was or when these new symptoms started. Daughter last spoke to him 3 days ago and had a normal conversation with him. Patient will open his eyes but does not provide any history and does not follow commands. Level V caveat applies  Past Medical History  Diagnosis Date  . Hypercholesteremia     takes Lipitor daily  . CAD (coronary artery disease)     s/p PTCA to PLV branch in 2002  . Non-insulin dependent diabetes mellitus   . PVD (peripheral vascular disease)     s/p right BKA  . CVA (cerebral infarction)     s/p PTA to middle cerebral artery;slurred speech  . HTN (hypertension)     takes Benazepril daily  . Pacemaker   . Shortness of breath     sitting as well as standing  . Glaucoma   . Complete heart block     s/p Medtronic PPM by JA 09/2009  . Stroke    Past Surgical History  Procedure Laterality Date  . Pacemaker insertion  09/2010  . Appendectomy    . Amputation      below amputation  . Cardiac catheterization      2002/2009  . Amputation  08/02/2011    Procedure: AMPUTATION BELOW KNEE;  Surgeon: Larina Earthly, MD;  Location: [facility] OR;  Service: Vascular;  Laterality: Left;  . Insert / replace / remove  pacemaker    . Radiology with anesthesia N/A 04/11/2013    Procedure: RADIOLOGY WITH ANESTHESIA;  Surgeon: Oneal Grout, MD;  Location: [facility] OR;  Service: Radiology;  Laterality: N/A;  LEFT CAROTID STENT   Family History  Problem Relation Age of Onset  . Heart failure Mother   . Heart failure Father   . Prostate cancer Brother    History  Substance Use Topics  . Smoking status: Never Smoker   . Smokeless tobacco: Never Used  . Alcohol Use: No    Review of Systems  Unable to perform ROS  level V caveat as above    Allergies  Review of patient's allergies indicates no known allergies.  Home Medications   Current Outpatient Rx  Name  Route  Sig  Dispense  Refill  . aspirin EC 81 MG tablet   Oral   Take 81 mg by mouth daily.         Marland Kitchen atorvastatin (LIPITOR) 80 MG tablet   Oral   Take 80 mg by mouth daily.         . carvedilol (COREG) 6.25 MG tablet   Oral   Take 6.25 mg by mouth 2 (two) times daily with a meal.         . clopidogrel (PLAVIX) 75 MG tablet   Oral   Take  75 mg by mouth daily.         . ferrous sulfate 325 (65 FE) MG tablet   Oral   Take 1 tablet (325 mg total) by mouth 3 (three) times daily with meals.   90 tablet   3   . furosemide (LASIX) 40 MG tablet   Oral   Take 40 mg by mouth daily.         . isosorbide mononitrate (IMDUR) 60 MG 24 hr tablet   Oral   Take 60 mg by mouth daily.         Marland Kitchen NITROGLYCERIN TRANSDERMAL TD   Transdermal   Place 1 patch onto the skin every 12 (twelve) hours.         . pantoprazole (PROTONIX) 40 MG tablet   Oral   Take 40 mg by mouth daily.         . ramipril (ALTACE) 5 MG capsule   Oral   Take 5 mg by mouth daily.         Marland Kitchen acetaminophen (TYLENOL) 325 MG tablet   Oral   Take 325 mg by mouth every 6 (six) hours as needed for mild pain.         . nitroGLYCERIN (NITROSTAT) 0.4 MG SL tablet   Sublingual   Place 0.4 mg under the tongue every 5 (five) minutes as needed for chest  pain.         Marland Kitchen senna-docusate (SENOKOT-S) 8.6-50 MG per tablet   Oral   Take 1 tablet by mouth at bedtime as needed for mild constipation.   30 tablet   3    BP 128/85  Pulse 63  Temp(Src) 98.6 F (37 C) (Oral)  Resp 16  Ht 4\' 6"  (1.372 m)  Wt 160 lb (72.576 kg)  BMI 38.56 kg/m2  SpO2 100% Physical Exam  Constitutional: He appears well-developed and well-nourished.  HENT:  Head: Normocephalic and atraumatic.  Eyes: No scleral icterus.  Neck: No tracheal deviation present.  Cardiovascular: Normal rate, regular rhythm and intact distal pulses.   Pulmonary/Chest: Effort normal and breath sounds normal. No stridor. No respiratory distress.  Abdominal: Soft. There is no tenderness.  Musculoskeletal:  Bilateral lower extremity BKA's  Neurological:  Awake, opens eyes, does not follow commands, aphasia, R facial droop. Right upper extremity with decreased tone versus left upper extremity  Skin: Skin is warm and dry.    ED Course  Procedures (including critical care time) Labs Review Labs Reviewed  CBC - Abnormal; Notable for the following:    RBC 3.52 (*)    Hemoglobin 10.1 (*)    HCT 30.3 (*)    RDW 16.4 (*)    All other components within normal limits  DIFFERENTIAL - Abnormal; Notable for the following:    Neutrophils Relative % 81 (*)    All other components within normal limits  COMPREHENSIVE METABOLIC PANEL - Abnormal; Notable for the following:    Glucose, Bld 130 (*)    Alkaline Phosphatase 134 (*)    GFR calc non Af Amer 64 (*)    GFR calc Af Amer 74 (*)    All other components within normal limits  PROTIME-INR  APTT  URINALYSIS, ROUTINE W REFLEX MICROSCOPIC  I-STAT TROPOININ, ED  I-STAT CG4 LACTIC ACID, ED   Imaging Review Ct Head (brain) Wo Contrast  04/20/2013   CLINICAL DATA:  Right-sided facial droop and right hemiparesis.  EXAM: CT HEAD WITHOUT CONTRAST  TECHNIQUE: Contiguous axial images were obtained  from the base of the skull through the vertex  without intravenous contrast.  COMPARISON:  04/14/2012  FINDINGS: Mild motion artifact is present despite multiple repeated attempts. Ventricles and cisterns are within normal. There is age related atrophic change present. Chronic ischemic microvascular disease present. There is mild low-attenuation with subtle local mass effect over the left frontal parietal region in the MCA territory suggesting acute infarction. No focal mass or midline shift. No evidence of acute hemorrhage. Remainder the exam is unremarkable.  IMPRESSION: Subtle low-attenuation with mild local mass effect over the left MCA territory of the frontal parietal region suggesting acute infarction. No evidence of acute hemorrhage.  Chronic ischemic microvascular disease and age related atrophy.   Electronically Signed   By: Elberta Fortisaniel  Boyle M.D.   On: 04/20/2013 23:58    Date: 04/21/2013  Rate: 72  Rhythm: paced  QRS Axis: left  Intervals: normal  ST/T Wave abnormalities: nonspecific ST changes  Conduction Disutrbances:nonspecific intraventricular conduction delay  Narrative Interpretation:   Old EKG Reviewed: none available   Neurology consult and ER evaluation, medical admission  MDM   Diagnosis: Stroke  Last seen well unknown, not a TPA candidate, not a code stroke Evaluated with labs and imaging reviewed as above. MED admit   Sunnie NielsenBrian Angas Isabell, MD 04/21/13 [phone]

## 2013-04-21 NOTE — Evaluation (Signed)
SLP Cancellation Note  Patient Details Name: [name] MRN: [id number] DOB: May 14, 1931   Cancelled treatment:       Reason Eval/Treat Not Completed:  (order for SLE received, upon chart review-pt also needs bse due to h/o dysphagia, MD please write order for swallow evaluation if you agree and when indicated as S[facility] who reports pt being transferred to ICU momentarily).  SLP to follow up next date 4/7 unless new order for swallow evaluation received.    MD- please order swallow evaluation when indicated-no order in chart but indicated per note from stroke team.  Thanks.  Donavan Burnetamara Jay Kempe, MS [facility] SLP [phone]

## 2013-04-22 ENCOUNTER — Inpatient Hospital Stay (HOSPITAL_COMMUNITY): Payer: Medicare Other

## 2013-04-22 LAB — GLUCOSE, CAPILLARY
GLUCOSE-CAPILLARY: 89 mg/dL (ref 70–99)
Glucose-Capillary: 94 mg/dL (ref 70–99)
Glucose-Capillary: 91 mg/dL (ref 70–99)
Glucose-Capillary: 89 mg/dL (ref 70–99)

## 2013-04-22 LAB — MRSA PCR SCREENING: MRSA by PCR: NEGATIVE

## 2013-04-22 MED ORDER — ASPIRIN 81 MG PO CHEW
81.0000 mg | CHEWABLE_TABLET | Freq: Every day | ORAL | Status: DC
  Administered 2013-04-22: 81 mg
  Filled 2013-04-22: qty 1

## 2013-04-22 MED ORDER — INSULIN ASPART 100 UNIT/ML ~~LOC~~ SOLN
0.0000 [IU] | Freq: Three times a day (TID) | SUBCUTANEOUS | Status: DC
  Administered 2013-04-24 (×3): 2 [IU] via SUBCUTANEOUS

## 2013-04-22 MED ORDER — ASPIRIN 81 MG PO CHEW
324.0000 mg | CHEWABLE_TABLET | Freq: Every day | ORAL | Status: DC
  Administered 2013-04-23 – 2013-04-25 (×3): 324 mg
  Filled 2013-04-22 (×6): qty 4

## 2013-04-22 NOTE — Evaluation (Signed)
Clinical/Bedside Swallow Evaluation Patient Details  Name: [name] MRN: [id number] Date of Birth: 1931-07-14  Today's Date: 04/22/2013 Time: 1310-1345 SLP Time Calculation (min): 35 min  Past Medical History:  Past Medical History  Diagnosis Date  . Hypercholesteremia     takes Lipitor daily  . CAD (coronary artery disease)     s/p PTCA to PLV branch in 2002  . Non-insulin dependent diabetes mellitus   . PVD (peripheral vascular disease)     s/p right BKA  . CVA (cerebral infarction)     s/p PTA to middle cerebral artery;slurred speech  . HTN (hypertension)     takes Benazepril daily  . Pacemaker   . Shortness of breath     sitting as well as standing  . Glaucoma   . Complete heart block     s/p Medtronic PPM by JA 09/2009  . Stroke    Past Surgical History:  Past Surgical History  Procedure Laterality Date  . Pacemaker insertion  09/2010  . Appendectomy    . Amputation      below amputation  . Cardiac catheterization      2002/2009  . Amputation  08/02/2011    Procedure: AMPUTATION BELOW KNEE;  Surgeon: Larina Earthly, MD;  Location: [facility] OR;  Service: Vascular;  Laterality: Left;  . Insert / replace / remove pacemaker    . Radiology with anesthesia N/A 04/11/2013    Procedure: RADIOLOGY WITH ANESTHESIA;  Surgeon: Oneal Grout, MD;  Location: [facility] OR;  Service: Radiology;  Laterality: N/A;  LEFT CAROTID STENT   HPI:  Patient is an 80 y.o. male admitted to hospital secondary to difficulty moving, slurred speech, and left-sided weakness. PMH: CAD, MI, ischemic cardiomyopathy, CHF, complete heart block, s/p permanent pacemaker placement, HTN, non-insulin dependent DM, PVD, s/p BKA, h/o CVA and PTA to middle cerebral artery.   Assessment / Plan / Recommendation Clinical Impression  Patient exhibits severe oral phase dysphagia with Reduced labial seal;Reduced lingual movement/coordination;Impaired anterior to posterior transit;Poor awareness of bolus.  Each bolus  administered was suctioned by SLP due to pt. being unable to move purees, liquid or ice chip posteriorly for swallow attempt.  Patient was awake for this assessment, but did not f/c's or attempt any verbilizations.    Aspiration Risk  Severe    Diet Recommendation NPO;Alternative means - temporary   Medication Administration: Via alternative means    Other  Recommendations Recommended Consults: MBS (Once patient is able to move bolus through the oral phase.)   Follow Up Recommendations  Other (comment) (Palliative Care)    Frequency and Duration        Pertinent Vitals/Pain Afebrile; CXR: No acute process.         Swallow Study Prior Functional Status       General Date of Onset: 04/03/13 HPI: Patient is an 80 y.o. male admitted to hospital secondary to difficulty moving, slurred speech, and left-sided weakness. PMH: CAD, MI, ischemic cardiomyopathy, CHF, complete heart block, s/p permanent pacemaker placement, HTN, non-insulin dependent DM, PVD, s/p BKA, h/o CVA and PTA to middle cerebral artery. Type of Study: Bedside swallow evaluation Previous Swallow Assessment: 3/19 BSE- Dys 2 thin, ? of esophageal dysphagia Diet Prior to this Study: NPO;Panda Temperature Spikes Noted: No Respiratory Status: Room air History of Recent Intubation: No Behavior/Cognition: Lethargic;Distractible;Requires cueing;Decreased sustained attention Oral Cavity - Dentition: Edentulous Self-Feeding Abilities: Total assist Patient Positioning: Upright in bed Baseline Vocal Quality: Aphonic (No verbal attempts) Volitional Cough: Cognitively unable to  elicit Volitional Swallow: Unable to elicit    Oral/Motor/Sensory Function Overall Oral Motor/Sensory Function: Impaired Labial ROM: Reduced right Labial Symmetry: Abnormal symmetry right Labial Strength: Reduced Labial Sensation: Reduced Lingual Symmetry: Other (Comment) (Pt. unable to f/c's for oral motor exam) Lingual Strength: Reduced Facial  ROM: Reduced right Facial Symmetry: Right droop Facial Strength: Reduced   Ice Chips Ice chips: Impaired Oral Phase Impairments: Reduced labial seal;Reduced lingual movement/coordination;Impaired anterior to posterior transit;Poor awareness of bolus Oral Phase Functional Implications: Right anterior spillage;Oral holding;Oral residue (SLP suctioned)   Thin Liquid Thin Liquid: Impaired Presentation: Spoon Oral Phase Impairments: Reduced labial seal;Reduced lingual movement/coordination;Impaired anterior to posterior transit;Poor awareness of bolus Oral Phase Functional Implications: Right anterior spillage;Oral residue;Oral holding Pharyngeal  Phase Impairments:  (No boluses made it past the oral phase.  Suctioned all.)    Nectar Thick Nectar Thick Liquid: Not tested   Honey Thick Honey Thick Liquid: Not tested   Puree Puree: Impaired Presentation: Spoon Oral Phase Impairments: Reduced labial seal;Reduced lingual movement/coordination;Impaired anterior to posterior transit;Poor awareness of bolus Oral Phase Functional Implications: Oral residue;Prolonged oral transit;Oral holding Pharyngeal Phase Impairments: Suspected delayed Swallow   Solid   GO    Solid: Not tested       [name] RochesterWillis, [name] 04/22/2013,3:50 PM

## 2013-04-22 NOTE — Clinical Documentation Improvement (Signed)
Possible Clinical Conditions?  Encephalopathy  Coma Other Condition Supporting Information: Risk Factors: CVA Signs & Symptoms:Per progress notes: 4/6: Pt. Is currently lethargic and will not arouse sufficiently for po attempts; AMS Diagnostics: assessment, scans Treatment: supportive Thank You, Amada Kingfisherebra J Hayes ,RN Clinical Documentation Specialist:  [phone] [facility]

## 2013-04-22 NOTE — Clinical Social Work Note (Signed)
Clinical Social Worker received referral from RN stating that patient is a current [facility].  CSW unable to speak with patient at bedside, but did leave a voicemail for patient daughter, Rupert Stacksikki [place] [phone]).  Patient daughter spoke to RN briefly about the possibility of changing facilities for patient at discharge.  CSW to further explore patient daughter wishes regarding facility change.  CSW remains available for support and will complete full assessment once return call received from patient daughter.  Macario GoldsJesse Kareem Cathey, Kentucky[place]CSW [phone]

## 2013-04-22 NOTE — Progress Notes (Signed)
Physical Therapy Evaluation Patient Details Name: [name] MRN: [id number] DOB: January 28, 1931 Today's Date: 04/22/2013   History of Present Illness  Pt admitted from SNF presenting with L frontoparietal MCA infarct. Pt with stroke in March and h/o bilat BKAs  Clinical Impression  Pt with increased lethargy and minimal eye opening to participate in PT. Pt with no command follow. Pt appears to have no voluntary mvmt in R UE/LE. Pt did not follow any commands during this session. Recommend SNF upon d/c for maximal functional recovery.    Follow Up Recommendations SNF    Equipment Recommendations  None recommended by PT    Recommendations for Other Services       Precautions / Restrictions Precautions Precautions: Fall Precaution Comments: L mitten Restrictions Weight Bearing Restrictions: No      Mobility  Bed Mobility Overal bed mobility: Needs Assistance Bed Mobility: Supine to Sit;Sit to Supine     Supine to sit: Total assist;+2 for physical assistance Sit to supine: Total assist;+2 for physical assistance   General bed mobility comments: pt with no initiation of tast at hand  Transfers Overall transfer level:  (not appropriate at this time)                  Ambulation/Gait                Stairs            Wheelchair Mobility    Modified Rankin (Stroke Patients Only) Modified Rankin (Stroke Patients Only) Pre-Morbid Rankin Score: Moderately severe disability Modified Rankin: Severe disability     Balance Overall balance assessment: Needs assistance Sitting-balance support: Feet unsupported;Single extremity supported Sitting balance-Leahy Scale: Zero Sitting balance - Comments: pt dependent to maintain upright position Postural control: Posterior lean;Left lateral lean                                   Pertinent Vitals/Pain Doesn't appear in pain    Home Living Family/patient expects to be discharged to:: Skilled  nursing facility     Type of Home: Skilled Nursing Facility           Additional Comments: pt was at [facility] since last hospital stay    Prior Function Level of Independence:  (unclear of level of function at SNF)               Hand Dominance   Dominant Hand: Right    Extremity/Trunk Assessment   Upper Extremity Assessment: LUE deficits/detail RUE Deficits / Details: pt with no mvmt of R UE, pt with no response to deep nail bed pressure     LUE Deficits / Details: pt with voluntary mvmt but unable to formally MMT due to impaired cognition   Lower Extremity Assessment:  (bilat BKA) RLE Deficits / Details: no voluntary mvmt, no response to pain LLE Deficits / Details: voluntary mvmt of LE however unable to MMT due to impaired ability to follow commands  Cervical / Trunk Assessment: Normal  Communication   Communication: Expressive difficulties;Receptive difficulties (pt non-verbal at this time)  Cognition Arousal/Alertness: Lethargic Behavior During Therapy: Flat affect;Restless Overall Cognitive Status: Impaired/Different from baseline Area of Impairment: Following commands;Attention (difficult to asssess due to poor cognition)   Current Attention Level: Focused   Following Commands:  (unable to follow commands)     Problem Solving: Slow processing;Requires verbal cues;Requires tactile cues      General Comments  Exercises        Assessment/Plan    PT Assessment Patient needs continued PT services  PT Diagnosis Generalized weakness   PT Problem List Decreased strength;Decreased range of motion;Decreased activity tolerance;Decreased balance;Decreased mobility;Decreased coordination;Decreased cognition;Decreased knowledge of use of DME;Decreased safety awareness;Decreased knowledge of precautions;Impaired sensation  PT Treatment Interventions DME instruction;Gait training;Functional mobility training;Therapeutic activities;Therapeutic  exercise;Balance training;Neuromuscular re-education;Cognitive remediation;Patient/family education;Wheelchair mobility training   PT Goals (Current goals can be found in the Care Plan section) Acute Rehab PT Goals Patient Stated Goal: didn't state PT Goal Formulation: Patient unable to participate in goal setting Time For Goal Achievement: 05/06/13 Potential to Achieve Goals: Fair    Frequency Min 3X/week   Barriers to discharge        Co-evaluation               End of Session   Activity Tolerance: Patient limited by lethargy Patient left: in bed;with call bell/phone within reach Nurse Communication: Mobility status         Time: 1537-1550 PT Time Calculation (min): 13 min   Charges:   PT Evaluation $Initial PT Evaluation Tier I: 1 Procedure PT Treatments $Therapeutic Activity: 8-22 mins   PT G CodesMarcene Brawn 04/22/2013, 4:32 PM  Lewis Shock, PT, DPT Pager #: [phone] Office #: [phone]

## 2013-04-22 NOTE — Progress Notes (Signed)
Subjective:  Patient remains unresponsive aphasic and did not follow commands with right sided paralysis  Objective:  Vital Signs in the last 24 hours: Temp:  [98 F (36.7 C)-99.2 F (37.3 C)] 98 F (36.7 C) (04/07 0800) Pulse Rate:  [62-128] 70 (04/07 0900) Resp:  [8-31] 16 (04/07 0900) BP: (123-186)/(54-93) 168/66 mmHg (04/07 0900) SpO2:  [89 %-100 %] 97 % (04/07 0900) Weight:  [68.8 kg (151 lb 10.8 oz)] 68.8 kg (151 lb 10.8 oz) (04/07 0500)  Intake/Output from previous day: 04/06 0701 - 04/07 0700 In: 2075.4 [I.V.:2075.4] Out: 675 [Urine:250; Emesis/NG output:425] Intake/Output from this shift: Total I/O In: 50 [I.V.:50] Out: -   Physical Exam: Neck: no adenopathy, no carotid bruit, no JVD and supple, symmetrical, trachea midline Lungs: Decreased breath sounds anterolaterally Heart: regular rate and rhythm, S1, S2 normal and Soft systolic murmur noted Abdomen: soft, non-tender; bowel sounds normal; no masses,  no organomegaly Extremities: Bilateral BKA  Lab Results:  Recent Labs  04/20/13 2249 04/21/13 0617  WBC 7.8 7.5  HGB 10.1* 10.0*  PLT 297 266    Recent Labs  04/20/13 2249 04/21/13 0617  NA 140  --   K 4.0  --   CL 100  --   CO2 26  --   GLUCOSE 130*  --   BUN 16  --   CREATININE 1.06 1.01   No results found for this basename: TROPONINI, CK, MB,  in the last 72 hours Hepatic Function Panel  Recent Labs  04/20/13 2249  PROT 7.7  ALBUMIN 3.5  AST 26  ALT 17  ALKPHOS 134*  BILITOT 0.4   No results found for this basename: CHOL,  in the last 72 hours No results found for this basename: PROTIME,  in the last 72 hours  Imaging: Imaging results have been reviewed and Ct Angio Head W/cm &/or Wo Cm  04/21/2013   CLINICAL DATA:  Left MCA territory infarct. History of recent stroke and carotid stenting. Evaluate stent and arterial vasculature of the neck.  EXAM: CT ANGIOGRAPHY HEAD AND NECK  TECHNIQUE: Multidetector CT imaging of the head and neck  was performed using the standard protocol during bolus administration of intravenous contrast. Multiplanar CT image reconstructions and MIPs were obtained to evaluate the vascular anatomy. Carotid stenosis measurements (when applicable) are obtained utilizing NASCET criteria, using the distal internal carotid diameter as the denominator.  CONTRAST:  100mL OMNIPAQUE IOHEXOL 350 MG/ML SOLN  COMPARISON:  Prior CT performed earlier on the same day as well as prior angiogram from 04/11/2013.  FINDINGS: CTA HEAD FINDINGS  The study markedly limited by motion artifact.  The distal cervical, petrous, cavernous, and supra clinoid segments of the internal carotid arteries are grossly patent without high-grade flow-limiting stenosis or occlusion. A1 segments are opacified bilaterally. Anterior communicating artery is opacified. Anterior cerebral arteries are grossly root normal.  There is question of focal occlusion of the mid left M1 segment (series 13, image 22). Attenuated flow was seen within the left MCA branches distally, which may be via leptomeningeal collateralization.  View right MCA artery and its branches are grossly patent.  The distal vertebral arteries are grossly patent. The vertebrobasilar junction and basilar artery are opacified. Posterior cerebral arteries are opacified and symmetric bilaterally. Superior cerebellar arteries are opacified. The anterior inferior and posterior inferior cerebral arteries are not well evaluated on this exam.  Focal hypoattenuation with loss of gray-white matter differentiation seen within the posterior left frontal lobe, concerning for acute ischemic infarct (  series 7, image 20). , concerning for acute ischemic infarct. No acute intracranial hemorrhage. No mass lesion or midline shift. No hydrocephalus. No extra-axial fluid collection.  CTA NECK FINDINGS  Examination is markedly limited by a motion artifact.  Visualized aortic arch is of normal caliber with normal 3 vessel  morphology. No high-grade stenosis seen at the origin of the great vessels. Subclavian arteries are patent bilaterally.  Without common carotid arteries are grossly patent without evidence of high-grade flow-limiting stenosis or dissection. Multi focal irregularity seen about the carotid bifurcations likely related to underlying atheromatous disease. Endovascular stent present within the left internal carotid artery extending from its origin at the level of the carotid bifurcation to approximately the level of C2. Flow within the stent itself is not well visualized. Just distal to the stent, flow appears somewhat attenuated as compared to the contralateral right internal carotid artery (series 6, image 82). Evaluation is limited due to motion. Distally, the left internal carotid artery is patent without high-grade flow-limiting stenosis.  The the right internal carotid artery is grossly patent with antegrade flow without evidence of high-grade flow-limiting stenosis or dissection.  Vertebral arteries are codominant. Vertebral arteries are grossly patent without evidence of high-grade flow-limiting stenosis, occlusion, or dissection.  Enlarged multinodular goiter noted with substernal extension of the left thyroid lobe. Soft tissues of the neck are otherwise within normal limits without evidence of mass lesion, loculated fluid collection, or focal inflammatory changes. No acute osseous abnormality. Visualized lung apices are clear.  IMPRESSION: CTA HEAD:  1. Markedly limited study due to motion. There is question of abrupt occlusion of the mid left M1 segment. Flow within the distal left MCA branches is somewhat attenuated, and may be via leptomeningeal collateralization. 2. Acute ischemic left MCA territory infarct involving the left frontoparietal region, stable relative to prior CT performed earlier. 3. No other proximal branch occlusion, or definite high-grade flow-limiting stenosis identified within the  intracranial circulation.  CTA NECK:  1. Markedly limited study due to motion. There is flow within the left internal carotid artery distal to the carotid stent, with flow within the stent itself not well evaluated on this examination due to extensive motion. Further evaluation with carotid Doppler ultrasound and/or catheter directed angiogram to evaluate stent patency recommended. 2. No other high-grade flow-limiting stenosis, occlusion, or dissection identified within the neck. 3. Enlarged multinodular goiter. Critical Value/emergent results were called by telephone at the time of interpretation on 04/21/2013 at 4:06 AM to Dr. Ritta Slot , who verbally acknowledged these results.   Electronically Signed   By: Rise Mu M.D.   On: 04/21/2013 04:06   Ct Head Wo Contrast  04/22/2013   CLINICAL DATA:  Follow up CVA.  EXAM: CT HEAD WITHOUT CONTRAST  TECHNIQUE: Contiguous axial images were obtained from the base of the skull through the vertex without intravenous contrast.  COMPARISON:  CTA of the head and neck performed 04/21/2013  FINDINGS: The evolving infarct involving portions of the left MCA and ACA territories is better characterized on the current study, with associated mass effect on the left lateral ventricle but no evidence of midline shift. There is no evidence of hemorrhagic transformation. There is diffuse involvement of the left basal ganglia and partial involvement of the left thalamus, with sparing of the caudate. There is some gyral sparing, with a medial left frontal lobe infarct and more diffuse left frontoparietal region infarct again seen.  Cerebellar atrophy is noted. Scattered small chronic lacunar infarcts are seen within  the cerebellar hemispheres bilaterally. Scattered periventricular white matter change likely reflects small vessel ischemic microangiopathy. The brainstem and fourth ventricle are within normal limits.  There is no evidence of fracture; visualized osseous  structures are unremarkable in appearance. The orbits are within normal limits. The paranasal sinuses and mastoid air cells are well-aerated. No significant soft tissue abnormalities are seen.  IMPRESSION: 1. Evolving infarct involving portions of the left MCA and ACA territories, better characterized on the current study, with associated mass-effect on the left lateral ventricle but no evidence of midline shift. No evidence of hemorrhagic transformation at this time. Diffuse involvement of the left basal ganglia and partial involvement of the left thalamus. 2. Cerebellar atrophy noted; scattered small chronic lacunar infarcts within the cerebellar hemispheres bilaterally. Scattered small vessel ischemic microangiopathy.   Electronically Signed   By: Roanna Raider M.D.   On: 04/22/2013 03:16   Ct Head (brain) Wo Contrast  04/20/2013   CLINICAL DATA:  Right-sided facial droop and right hemiparesis.  EXAM: CT HEAD WITHOUT CONTRAST  TECHNIQUE: Contiguous axial images were obtained from the base of the skull through the vertex without intravenous contrast.  COMPARISON:  04/14/2012  FINDINGS: Mild motion artifact is present despite multiple repeated attempts. Ventricles and cisterns are within normal. There is age related atrophic change present. Chronic ischemic microvascular disease present. There is mild low-attenuation with subtle local mass effect over the left frontal parietal region in the MCA territory suggesting acute infarction. No focal mass or midline shift. No evidence of acute hemorrhage. Remainder the exam is unremarkable.  IMPRESSION: Subtle low-attenuation with mild local mass effect over the left MCA territory of the frontal parietal region suggesting acute infarction. No evidence of acute hemorrhage.  Chronic ischemic microvascular disease and age related atrophy.   Electronically Signed   By: Elberta Fortis M.D.   On: 04/20/2013 23:58   Ct Angio Neck W/cm &/or Wo/cm  04/21/2013   CLINICAL DATA:   Left MCA territory infarct. History of recent stroke and carotid stenting. Evaluate stent and arterial vasculature of the neck.  EXAM: CT ANGIOGRAPHY HEAD AND NECK  TECHNIQUE: Multidetector CT imaging of the head and neck was performed using the standard protocol during bolus administration of intravenous contrast. Multiplanar CT image reconstructions and MIPs were obtained to evaluate the vascular anatomy. Carotid stenosis measurements (when applicable) are obtained utilizing NASCET criteria, using the distal internal carotid diameter as the denominator.  CONTRAST:  OMNIPAQUE IOHEXOL 350 MG/ML SOLN  COMPARISON:  Prior CT performed earlier on the same day as well as prior angiogram from 04/11/2013.  FINDINGS: CTA HEAD FINDINGS  The study markedly limited by motion artifact.  The distal cervical, petrous, cavernous, and supra clinoid segments of the internal carotid arteries are grossly patent without high-grade flow-limiting stenosis or occlusion. A1 segments are opacified bilaterally. Anterior communicating artery is opacified. Anterior cerebral arteries are grossly root normal.  There is question of focal occlusion of the mid left M1 segment (series 13, image 22). Attenuated flow was seen within the left MCA branches distally, which may be via leptomeningeal collateralization.  View right MCA artery and its branches are grossly patent.  The distal vertebral arteries are grossly patent. The vertebrobasilar junction and basilar artery are opacified. Posterior cerebral arteries are opacified and symmetric bilaterally. Superior cerebellar arteries are opacified. The anterior inferior and posterior inferior cerebral arteries are not well evaluated on this exam.  Focal hypoattenuation with loss of gray-white matter differentiation seen within the posterior left frontal  lobe, concerning for acute ischemic infarct (series 7, image 20). , concerning for acute ischemic infarct. No acute intracranial hemorrhage. No  mass lesion or midline shift. No hydrocephalus. No extra-axial fluid collection.  CTA NECK FINDINGS  Examination is markedly limited by a motion artifact.  Visualized aortic arch is of normal caliber with normal 3 vessel morphology. No high-grade stenosis seen at the origin of the great vessels. Subclavian arteries are patent bilaterally.  Without common carotid arteries are grossly patent without evidence of high-grade flow-limiting stenosis or dissection. Multi focal irregularity seen about the carotid bifurcations likely related to underlying atheromatous disease. Endovascular stent present within the left internal carotid artery extending from its origin at the level of the carotid bifurcation to approximately the level of C2. Flow within the stent itself is not well visualized. Just distal to the stent, flow appears somewhat attenuated as compared to the contralateral right internal carotid artery (series 6, image 82). Evaluation is limited due to motion. Distally, the left internal carotid artery is patent without high-grade flow-limiting stenosis.  The the right internal carotid artery is grossly patent with antegrade flow without evidence of high-grade flow-limiting stenosis or dissection.  Vertebral arteries are codominant. Vertebral arteries are grossly patent without evidence of high-grade flow-limiting stenosis, occlusion, or dissection.  Enlarged multinodular goiter noted with substernal extension of the left thyroid lobe. Soft tissues of the neck are otherwise within normal limits without evidence of mass lesion, loculated fluid collection, or focal inflammatory changes. No acute osseous abnormality. Visualized lung apices are clear.  IMPRESSION: CTA HEAD:  1. Markedly limited study due to motion. There is question of abrupt occlusion of the mid left M1 segment. Flow within the distal left MCA branches is somewhat attenuated, and may be via leptomeningeal collateralization. 2. Acute ischemic left MCA  territory infarct involving the left frontoparietal region, stable relative to prior CT performed earlier. 3. No other proximal branch occlusion, or definite high-grade flow-limiting stenosis identified within the intracranial circulation.  CTA NECK:  1. Markedly limited study due to motion. There is flow within the left internal carotid artery distal to the carotid stent, with flow within the stent itself not well evaluated on this examination due to extensive motion. Further evaluation with carotid Doppler ultrasound and/or catheter directed angiogram to evaluate stent patency recommended. 2. No other high-grade flow-limiting stenosis, occlusion, or dissection identified within the neck. 3. Enlarged multinodular goiter. Critical Value/emergent results were called by telephone at the time of interpretation on 04/21/2013 at 4:06 AM to Dr. Ritta Slot , who verbally acknowledged these results.   Electronically Signed   By: Rise Mu M.D.   On: 04/21/2013 04:06   Dg Chest Portable 1 View  04/21/2013   CLINICAL DATA:  CVA.  EXAM: PORTABLE CHEST - 1 VIEW  COMPARISON:  Chest radiograph performed 04/03/2013  FINDINGS: The lungs are well-aerated and clear. There is no evidence of focal opacification, pleural effusion or pneumothorax.  The cardiomediastinal silhouette is within normal limits. A pacemaker is noted overlying the left chest wall, with leads ending overlying the right atrium and right ventricle. Clips are noted overlying the left axilla. No acute osseous abnormalities are seen.  IMPRESSION: No acute cardiopulmonary process seen.   Electronically Signed   By: Roanna Raider M.D.   On: 04/21/2013 00:56   Dg Abd Portable 1v  04/21/2013   CLINICAL DATA:  Nasogastric tube placement.  EXAM: PORTABLE ABDOMEN - 1 VIEW  COMPARISON:  DG ABD ACUTE W/CHEST dated 07/31/2011  FINDINGS: Weighted tip feeding tube is present in the proximal gastric fundus. Bowel gas pattern appears normal. Contrast fills the  urinary bladder. Monitoring leads project over the chest. Pacemaker leads partially visualized. Motion artifact present over the lower chest.  IMPRESSION: Weighted tip feeding tube in the proximal gastric fundus.   Electronically Signed   By: Andreas Newport M.D.   On: 04/21/2013 17:31    Cardiac Studies:  Assessment/Plan:  Acute left frontal parietal MCA /ACA territory infarct with right-sided paralysis History of recurrent CVA in the past status post recent left internal carotid artery PTA/stenting Coronary artery disease history of MI in the past status post PCI to RCA in the past  Hypertension  Ischemic cardiomyopathy  Compensated systolic heart failure  Diabetes mellitus controlled by diet  Hypercholesteremia  Peripheral vascular disease status post bilateral BKA in past  Plan Continue present supportive care Discuss with family regarding CODE STATUS again Overall prognosis poor  LOS: 2 days    [name] N 04/22/2013, 10:05 AM

## 2013-04-22 NOTE — Evaluation (Signed)
Speech Language Pathology Evaluation Patient Details Name: [name] MRN: [id number] DOB: January 26, 1931 Today's Date: 04/22/2013 Time: [id number] SLP Time Calculation (min): 35 min  Problem List:  Patient Active Problem List   Diagnosis Date Noted  . Aphasia S/P CVA 04/21/2013  . Left-sided weakness 04/03/2013  . Stroke 04/03/2013  . CVA (cerebral infarction) 04/03/2013  . Pacemaker-Medtronic 10/02/2011  . Atherosclerosis of native arteries of the extremities with ulceration 07/18/2011  . Critical lower limb ischemia 06/14/2011  . DM 12/22/2009  . HYPERCHOLESTEROLEMIA 12/22/2009  . HYPERTENSION 12/22/2009  . Complete heart block 12/22/2009   Past Medical History:  Past Medical History  Diagnosis Date  . Hypercholesteremia     takes Lipitor daily  . CAD (coronary artery disease)     s/p PTCA to PLV branch in 2002  . Non-insulin dependent diabetes mellitus   . PVD (peripheral vascular disease)     s/p right BKA  . CVA (cerebral infarction)     s/p PTA to middle cerebral artery;slurred speech  . HTN (hypertension)     takes Benazepril daily  . Pacemaker   . Shortness of breath     sitting as well as standing  . Glaucoma   . Complete heart block     s/p Medtronic PPM by JA 09/2009  . Stroke    Past Surgical History:  Past Surgical History  Procedure Laterality Date  . Pacemaker insertion  09/2010  . Appendectomy    . Amputation      below amputation  . Cardiac catheterization      2002/2009  . Amputation  08/02/2011    Procedure: AMPUTATION BELOW KNEE;  Surgeon: Larina Earthlyodd F Early, MD;  [facility];  Service: Vascular;  Laterality: Left;  . Insert / replace / remove pacemaker    . Radiology with anesthesia N/A 04/11/2013    Procedure: RADIOLOGY WITH ANESTHESIA;  Surgeon: Oneal GroutSanjeev K Deveshwar, MD;  Location: [facility] OR;  Service: Radiology;  Laterality: N/A;  LEFT CAROTID STENT   HPI:  Patient is an 80 y.o. male admitted to hospital secondary to difficulty moving, slurred  speech, and left-sided weakness. PMH: CAD, MI, ischemic cardiomyopathy, CHF, complete heart block, s/p permanent pacemaker placement, HTN, non-insulin dependent DM, PVD, s/p BKA, h/o CVA and PTA to middle cerebral artery.   Assessment / Plan / Recommendation Clinical Impression  Patient exhibits severe oral phase dysphagia with Reduced labial seal;Reduced lingual movement/coordination;Impaired anterior to posterior transit;Poor awareness of bolus.  Each bolus administered was suctioned by SLP due to pt. being unable to move purees, liquid or ice chip posteriorly for swallow attempt.  Patient was awake for this assessment, but did not f/c's or attempt any verbilizations.    SLP Assessment   (Defer until GOC are established with PCT)    Follow Up Recommendations  Other (comment) (Palliative Care)    Frequency and Duration        Pertinent Vitals/Pain n/a   SLP Goals  SLP Goals Potential to Achieve Goals: Poor Potential Considerations: Ability to learn/carryover information;Co-morbidities;Medical prognosis;Previous level of function;Severity of impairments Progress/Goals/Alternative treatment plan discussed with pt/caregiver and they: No caregivers available  SLP Evaluation Prior Functioning  Cognitive/Linguistic Baseline: Baseline deficits Baseline deficit details: Dysarthria Type of Home: Skilled Nursing Facility Vocation: Retired   IT consultantCognition  Overall Cognitive Status: No family/caregiver present to determine baseline cognitive functioning Arousal/Alertness: Lethargic Orientation Level: Other (comment) (uta- pt nonverbal) Attention: Sustained Sustained Attention: Impaired Sustained Attention Impairment: Verbal complex Memory:  (Unable to assess) Awareness: Impaired  Awareness Impairment: Intellectual impairment Problem Solving: Impaired Problem Solving Impairment: Verbal basic;Functional basic Safety/Judgment: Impaired    Comprehension  Auditory Comprehension Overall  Auditory Comprehension: Impaired at baseline Interfering Components: Attention Visual Recognition/Discrimination Discrimination: Not tested Reading Comprehension Reading Status: Not tested    Expression Expression Primary Mode of Expression:  (Nonverbal) Verbal Expression Overall Verbal Expression: Impaired at baseline Initiation: Impaired Repetition: Impaired Naming: Impairment Pragmatics: Impairment Interfering Components: Attention;Premorbid deficit Non-Verbal Means of Communication: Not applicable Written Expression Dominant Hand: Right Written Expression: Not tested   Oral / Motor Oral Motor/Sensory Function Overall Oral Motor/Sensory Function: Impaired Labial ROM: Reduced right Labial Symmetry: Abnormal symmetry right Labial Strength: Reduced Labial Sensation: Reduced Lingual ROM: Reduced right Lingual Symmetry: Other (Comment) (Pt. unable to f/c's for oral motor exam) Lingual Strength: Reduced Facial ROM: Reduced right Facial Symmetry: Right droop Facial Strength: Reduced Facial Sensation: Reduced Motor Speech Phonation: Aphonic Articulation: Impaired Intelligibility: Unable to assess (comment) Interfering Components: Premorbid status   GO     Maryjo Rochester T 04/22/2013, 4:06 PM

## 2013-04-22 NOTE — Progress Notes (Signed)
Stroke Team Progress Note  HISTORY [name] is a 80 y.o. male with a history of recent right brain stroke in March who underwent a left carotid stent placement following his rehab stay. He has been on dual antiplatelet therapy. He was noticed to have a change around 1pm today 04/20/2013 reported as "increased confusion." Around 8pm, his family arrived and noticed that he was not talking and therefore insisted he be brought to the emergency room. Patient was not administered TPA secondary to recent stroke, outside of window. He was admitted for further evaluation and treatment.  SUBJECTIVE His granddaughter is at the bedside. He is more awake than yesterday. Ended up in ICU as step down bed not available.   OBJECTIVE Most recent Vital Signs: Filed Vitals:   04/22/13 0500 04/22/13 0600 04/22/13 0700 04/22/13 0800  BP: 123/54 170/75 172/64 154/65  Pulse: 62 88 89 62  Temp:      TempSrc:      Resp: 8 13 14 10   Height:      Weight: 68.8 kg (151 lb 10.8 oz)     SpO2: 99% 98% 99% 99%   CBG (last 3)   Recent Labs  04/21/13 0738  GLUCAP 118*    IV Fluid Intake:   . sodium chloride 50 mL/hr at 04/22/13 0800    MEDICATIONS  . aspirin  300 mg Rectal Daily  . clopidogrel  75 mg Oral Q breakfast  . heparin  5,000 Units Subcutaneous 3 times per day  . nitroGLYCERIN  1 inch Topical 4 times per day  . pantoprazole (PROTONIX) IV  40 mg Intravenous QHS   PRN:  senna-docusate  Diet:  NPO  Activity:   DVT Prophylaxis:  Heparin 5000 units sq tid   CLINICALLY SIGNIFICANT STUDIES Basic Metabolic Panel:   Recent Labs Lab 04/15/13 0918 04/20/13 2249 04/21/13 0617  NA 140 140  --   K 4.1 4.0  --   CL 106 100  --   CO2 21 26  --   GLUCOSE 155* 130*  --   BUN 14 16  --   CREATININE 1.03 1.06 1.01  CALCIUM 9.2 9.7  --    Liver Function Tests:   Recent Labs Lab 04/20/13 2249  AST 26  ALT 17  ALKPHOS 134*  BILITOT 0.4  PROT 7.7  ALBUMIN 3.5   CBC:   Recent Labs Lab  04/20/13 2249 04/21/13 0617  WBC 7.8 7.5  NEUTROABS 6.3  --   HGB 10.1* 10.0*  HCT 30.3* 29.9*  MCV 86.1 85.9  PLT 297 266   Coagulation:   Recent Labs Lab 04/20/13 2249  LABPROT 13.6  INR 1.06   Cardiac Enzymes: No results found for this basename: CKTOTAL, CKMB, CKMBINDEX, TROPONINI,  in the last 168 hours Urinalysis:   Recent Labs Lab 04/21/13 0253  COLORURINE YELLOW  LABSPEC 1.020  PHURINE 5.0  GLUCOSEU NEGATIVE  HGBUR NEGATIVE  BILIRUBINUR NEGATIVE  KETONESUR NEGATIVE  PROTEINUR NEGATIVE  UROBILINOGEN 0.2  NITRITE NEGATIVE  LEUKOCYTESUR NEGATIVE   Lipid Panel    Component Value Date/Time   CHOL 125 04/04/2013 0208   TRIG 49 04/04/2013 0208   HDL 52 04/04/2013 0208   CHOLHDL 2.4 04/04/2013 0208   VLDL 10 04/04/2013 0208   LDLCALC 63 04/04/2013 0208   HgbA1C  Lab Results  Component Value Date   HGBA1C 6.8* 04/04/2013    Urine Drug Screen:     Component Value Date/Time   LABOPIA NONE DETECTED 04/03/2013 0936  COCAINSCRNUR NONE DETECTED 04/03/2013 0936   LABBENZ NONE DETECTED 04/03/2013 0936   AMPHETMU NONE DETECTED 04/03/2013 0936   THCU NONE DETECTED 04/03/2013 0936   LABBARB NONE DETECTED 04/03/2013 0936    Alcohol Level: No results found for this basename: ETH,  in the last 168 hours   CT of the brain  04/20/2013   Subtle low-attenuation with mild local mass effect over the left MCA territory of the frontal parietal region suggesting acute infarction. No evidence of acute hemorrhage.  Chronic ischemic microvascular disease and age related atrophy.     CT angio of the brain  04/21/2013   1. Markedly limited study due to motion. There is question of abrupt occlusion of the mid left M1 segment. Flow within the distal left MCA branches is somewhat attenuated, and may be via leptomeningeal collateralization. 2. Acute ischemic left MCA territory infarct involving the left frontoparietal region, stable relative to prior CT performed earlier. 3. No other proximal branch  occlusion, or definite high-grade flow-limiting stenosis identified within the intracranial circulation.    CT angio of the neck  04/21/2013     1. Markedly limited study due to motion. There is flow within the left internal carotid artery distal to the carotid stent, with flow within the stent itself not well evaluated on this examination due to extensive motion. Further evaluation with carotid Doppler ultrasound and/or catheter directed angiogram to evaluate stent patency recommended. 2. No other high-grade flow-limiting stenosis, occlusion, or dissection identified within the neck. 3. Enlarged multinodular goiter.   MRI/A of the brain  pacer  2D Echocardiogram  04/04/2013  Ejection fraction 25-30%. No cardiac source of emboli was identified.  Carotid Doppler  Bilateral: 1-39% ICA stenosis. Vertebral artery flow is antegrade. Left ICA stent is patent. No evidence of thrombus.  CXR  04/21/2013    No acute cardiopulmonary process seen.     EKG  normal sinus rhythm. For complete results please see formal report.   Therapy Recommendations   Physical Exam   Frail cachectic african [place] male not in distress. . Afebrile. Head is nontraumatic. Neck is supple without bruit.  . Cardiac exam no murmur or gallop. Lungs are clear to auscultation. Distal pulses are not felt in LE due to bilateral BKA Neurological Exam : . Drowsy but can be aroused. Eyes are closed. Will not follow commands. Not speaking. Forced left gaze deviation. Pupils irregular but reactive. Doll's eye movement absent. Right lower facial weakness. Tongue midline. Dense right hemiplegia with decreased tone. Able to move left upper and lower extremity against gravity to sternal rub. Normal tone on the left. Plantars cannot be checked.  ASSESSMENT Mr. Conchita ParisBilly Prosise is a 80 y.o. male presenting from the NH with aphasia. Imaging confirms a new left brain infarct, associated with L M2 stenosis seen on recent angio, likely clot formed there and  led to stroke. Recent incidental L ICA stenosis with stent placed 04/11/2013 proximal L ICA. Patient with right brain stroke 03/25/2013. aspirin 81 mg orally every day and clopidogrel 75 mg orally every day prior to admission. Now on aspirin 81 mg orally every day and clopidogrel 75 mg orally every day for secondary stroke prevention, though patient unable to swallow at this time. Patient with resultant stuporous state that is improving (more awake), right hemiparesis, global aphasia (still not speaking or following commands), dysphagia.   Right brain stroke 03/25/2013, dysarthria and left hemiparesis, discharged to rehabilitation Prior stroke Hyperlipidemia, LDL 63 - on Lipitor prior to admission  Permanent pacemaker  Coronary artery disease  Bilateral lower extremity amputations  Hypertension  Left ventricular dysfunction - ejection fraction 25-30% by 2-D echo  Hospital day # 2  TREATMENT/PLAN  Transfer from ICU to step down   Continue aspirin 81 mg orally every day and clopidogrel 75 mg orally every day for secondary stroke prevention as panda placed.   Need to discuss patient's diagnosis and prognosis with the family in order to determine future plan of care - granddaughter is going to text the family who makes decisions and have them come to the hospital   Out of bed with therapy evaluations ok for today  ST to assess swallow  Check TCD to evaluate left MCA focal stenosis noted on CTA  Add SSI and CBGs per order set  I spoke to daughter over the phone and answered questions.  Annie Main, MSN, RN, ANVP-BC, AGPCNP-BC [facility] Pager: [phone] 04/22/2013 8:43 AM  I have personally obtained a history, examined the patient, evaluated imaging results, and formulated the assessment and plan of care. I agree with the above. Delia Heady, MD  To contact Stroke Continuity provider, please refer to [email or web address]. After hours, contact General Neurology

## 2013-04-22 NOTE — Progress Notes (Signed)
TCD completed. 

## 2013-04-23 LAB — GLUCOSE, CAPILLARY
GLUCOSE-CAPILLARY: 79 mg/dL (ref 70–99)
Glucose-Capillary: 74 mg/dL (ref 70–99)
Glucose-Capillary: 83 mg/dL (ref 70–99)
Glucose-Capillary: 118 mg/dL — ABNORMAL HIGH (ref 70–99)

## 2013-04-23 MED ORDER — JEVITY 1.2 CAL PO LIQD
1000.0000 mL | ORAL | Status: DC
  Administered 2013-04-24: 1000 mL
  Administered 2013-04-24 – 2013-04-25 (×2)
  Filled 2013-04-23 (×6): qty 1000
  Filled 2013-04-23: qty 237
  Filled 2013-04-23: qty 1000

## 2013-04-23 MED ORDER — JEVITY 1.2 CAL PO LIQD
1000.0000 mL | ORAL | Status: DC
  Filled 2013-04-23 (×2): qty 1000

## 2013-04-23 NOTE — Progress Notes (Signed)
Stroke Team Progress Note  HISTORY [name] is a 80 y.o. male with a history of recent right brain stroke in March who underwent a left carotid stent placement following his rehab stay. He has been on dual antiplatelet therapy. He was noticed to have a change around 1pm today 04/20/2013 reported as "increased confusion." Around 8pm, his family arrived and noticed that he was not talking and therefore insisted he be brought to the emergency room. Patient was not administered TPA secondary to recent stroke, outside of window. He was admitted for further evaluation and treatment.  SUBJECTIVE No family is at the bedside.  OBJECTIVE Most recent Vital Signs: Filed Vitals:   04/23/13 0400 04/23/13 0500 04/23/13 0600 04/23/13 0700  BP: 155/69 168/66 171/76 167/67  Pulse: 62 81 71 75  Temp: 98.5 F (36.9 C)     TempSrc: Axillary     Resp: 12 16 11 17   Height:      Weight:      SpO2: 97% 97% 99% 99%   CBG (last 3)   Recent Labs  04/22/13 1555 04/22/13 1938 04/23/13 0830  GLUCAP 89 89 74    IV Fluid Intake:   . sodium chloride 50 mL/hr at 04/23/13 0700    MEDICATIONS  . aspirin  324 mg Per Tube Daily  . clopidogrel  75 mg Oral Q breakfast  . heparin  5,000 Units Subcutaneous 3 times per day  . insulin aspart  0-9 Units Subcutaneous TID WC  . nitroGLYCERIN  1 inch Topical 4 times per day  . pantoprazole (PROTONIX) IV  40 mg Intravenous QHS   PRN:  senna-docusate  Diet:  NPO  Activity:  OOB DVT Prophylaxis:  Heparin 5000 units sq tid   CLINICALLY SIGNIFICANT STUDIES Basic Metabolic Panel:   Recent Labs Lab 04/20/13 2249 04/21/13 0617  NA 140  --   K 4.0  --   CL 100  --   CO2 26  --   GLUCOSE 130*  --   BUN 16  --   CREATININE 1.06 1.01  CALCIUM 9.7  --    Liver Function Tests:   Recent Labs Lab 04/20/13 2249  AST 26  ALT 17  ALKPHOS 134*  BILITOT 0.4  PROT 7.7  ALBUMIN 3.5   CBC:   Recent Labs Lab 04/20/13 2249 04/21/13 0617  WBC 7.8 7.5   NEUTROABS 6.3  --   HGB 10.1* 10.0*  HCT 30.3* 29.9*  MCV 86.1 85.9  PLT 297 266   Coagulation:   Recent Labs Lab 04/20/13 2249  LABPROT 13.6  INR 1.06   Cardiac Enzymes: No results found for this basename: CKTOTAL, CKMB, CKMBINDEX, TROPONINI,  in the last 168 hours Urinalysis:   Recent Labs Lab 04/21/13 0253  COLORURINE YELLOW  LABSPEC 1.020  PHURINE 5.0  GLUCOSEU NEGATIVE  HGBUR NEGATIVE  BILIRUBINUR NEGATIVE  KETONESUR NEGATIVE  PROTEINUR NEGATIVE  UROBILINOGEN 0.2  NITRITE NEGATIVE  LEUKOCYTESUR NEGATIVE   Lipid Panel    Component Value Date/Time   CHOL 125 04/04/2013 0208   TRIG 49 04/04/2013 0208   HDL 52 04/04/2013 0208   CHOLHDL 2.4 04/04/2013 0208   VLDL 10 04/04/2013 0208   LDLCALC 63 04/04/2013 0208   HgbA1C  Lab Results  Component Value Date   HGBA1C 6.8* 04/04/2013    Urine Drug Screen:     Component Value Date/Time   LABOPIA NONE DETECTED 04/03/2013 0936   COCAINSCRNUR NONE DETECTED 04/03/2013 0936   LABBENZ NONE DETECTED 04/03/2013  0936   AMPHETMU NONE DETECTED 04/03/2013 0936   THCU NONE DETECTED 04/03/2013 0936   LABBARB NONE DETECTED 04/03/2013 0936    Alcohol Level: No results found for this basename: ETH,  in the last 168 hours   CT of the brain  04/20/2013   Subtle low-attenuation with mild local mass effect over the left MCA territory of the frontal parietal region suggesting acute infarction. No evidence of acute hemorrhage.  Chronic ischemic microvascular disease and age related atrophy.     CT angio of the brain  04/21/2013   1. Markedly limited study due to motion. There is question of abrupt occlusion of the mid left M1 segment. Flow within the distal left MCA branches is somewhat attenuated, and may be via leptomeningeal collateralization. 2. Acute ischemic left MCA territory infarct involving the left frontoparietal region, stable relative to prior CT performed earlier. 3. No other proximal branch occlusion, or definite high-grade  flow-limiting stenosis identified within the intracranial circulation.   CT angio of the neck  04/21/2013     1. Markedly limited study due to motion. There is flow within the left internal carotid artery distal to the carotid stent, with flow within the stent itself not well evaluated on this examination due to extensive motion. Further evaluation with carotid Doppler ultrasound and/or catheter directed angiogram to evaluate stent patency recommended. 2. No other high-grade flow-limiting stenosis, occlusion, or dissection identified within the neck. 3. Enlarged multinodular goiter.   MRI/A of the brain  pacer  2D Echocardiogram  04/04/2013  Ejection fraction 25-30%. No cardiac source of emboli was identified.  Carotid Doppler  Bilateral: 1-39% ICA stenosis. Vertebral artery flow is antegrade. Left ICA stent is patent. No evidence of thrombus.  TCD    CXR  04/21/2013    No acute cardiopulmonary process seen.     EKG  normal sinus rhythm. For complete results please see formal report.   Therapy Recommendations SNF  Physical Exam   Frail cachectic african american male not in distress. . Afebrile. Head is nontraumatic. Neck is supple without bruit.  . Cardiac exam no murmur or gallop. Lungs are clear to auscultation. Distal pulses are not felt in LE due to bilateral BKA Neurological Exam : . Drowsy but can be aroused. Eyes are closed. Will not follow commands. Not speaking. Forced left gaze deviation. Pupils irregular but reactive. Doll's eye movement absent. Right lower facial weakness. Tongue midline. Dense right hemiplegia with decreased tone. Able to move left upper and lower extremity against gravity to sternal rub. Normal tone on the left. Plantars cannot be checked.  ASSESSMENT [name] is a 80 y.o. male presenting from the NH with aphasia. Imaging confirms a new left brain infarct, associated with L M2 stenosis seen on recent angio, likely clot formed there and led to stroke. Recent  incidental L ICA stenosis with stent placed 04/11/2013 proximal L ICA. Patient with right brain stroke 03/25/2013. aspirin 81 mg orally every day and clopidogrel 75 mg orally every day prior to admission. Now on aspirin 81 mg orally every day and clopidogrel 75 mg orally every day for secondary stroke prevention, though patient unable to swallow at this time. Patient with resultant stuporous state that is improving (more awake), right hemiparesis, global aphasia (still not speaking or following commands), dysphagia (failed ST swallow eval).   Right brain stroke 03/25/2013, dysarthria and left hemiparesis, discharged to rehabilitation Prior stroke Hyperlipidemia, LDL 63 - on Lipitor prior to admission  Permanent pacemaker  Coronary  artery disease  Bilateral lower extremity amputations  Hypertension  Left ventricular dysfunction - ejection fraction 25-30% by 2-D echo  Hospital day # 3  TREATMENT/PLAN  Transfer to floor   Continue aspirin 81 mg orally every day and clopidogrel 75 mg orally every day for secondary stroke prevention as panda placed.   Start tube feedings, ST following swallow  F/u TCD to evaluate left MCA focal stenosis noted on CTA  Disposition:  SNF   Annie Main, MSN, RN, ANVP-BC, AGPCNP-BC [facility] Pager: [phone] 04/23/2013 8:33 AM  I have personally obtained a history, examined the patient, evaluated imaging results, and formulated the assessment and plan of care. I agree with the above. Delia Heady, MD  To contact Stroke Continuity provider, please refer to [email or web address]. After hours, contact General Neurology

## 2013-04-23 NOTE — Progress Notes (Signed)
OT Cancellation Note  Patient Details Name: [name] MRN: [id number] DOB: [birth year]   Cancelled Treatment:    Reason Eval/Treat Not Completed: Other (comment) PT from SNF and plan is to return to SNF. Will defer OT to SNF. If OT eval is needed for admission, please call [phone] or text page [phone]. Thank you. [facility] Columbia[facility] S Eithen Castiglia HoltonHilary Barak Bialecki, [place] CarolinaOTR/L  [phone] 04/23/2013 04/23/2013, 1:21 PM

## 2013-04-23 NOTE — Progress Notes (Signed)
Subjective:  Patient remains unresponsive aphasic and cannot follow commands occasionally opens eyes.  Objective:  Vital Signs in the last 24 hours: Temp:  [97.9 F (36.6 C)-99.5 F (37.5 C)] 98.5 F (36.9 C) (04/08 0400) Pulse Rate:  [60-125] 75 (04/08 0700) Resp:  [11-20] 17 (04/08 0700) BP: (146-185)/(59-109) 167/67 mmHg (04/08 0700) SpO2:  [95 %-100 %] 99 % (04/08 0700)  Intake/Output from previous day: 04/07 0701 - 04/08 0700 In: 1200 [I.V.:1200] Out: 560 [Urine:560] Intake/Output from this shift: Total I/O In: 200 [I.V.:200] Out: -   Physical Exam: Neck: no adenopathy, no carotid bruit, no JVD and supple, symmetrical, trachea midline Lungs: clear to auscultation anterolaterally Heart: regular rate and rhythm, S1, S2 normal and soft systolic murmur noted Abdomen: soft, non-tender; bowel sounds normal; no masses,  no organomegaly Extremities: bilateral BKA Neuro grossly unchanged Lab Results:  Recent Labs  04/20/13 2249 04/21/13 0617  WBC 7.8 7.5  HGB 10.1* 10.0*  PLT 297 266    Recent Labs  04/20/13 2249 04/21/13 0617  NA 140  --   K 4.0  --   CL 100  --   CO2 26  --   GLUCOSE 130*  --   BUN 16  --   CREATININE 1.06 1.01   No results found for this basename: TROPONINI, CK, MB,  in the last 72 hours Hepatic Function Panel  Recent Labs  04/20/13 2249  PROT 7.7  ALBUMIN 3.5  AST 26  ALT 17  ALKPHOS 134*  BILITOT 0.4   No results found for this basename: CHOL,  in the last 72 hours No results found for this basename: PROTIME,  in the last 72 hours  Imaging: Imaging results have been reviewed and Ct Head Wo Contrast  04/22/2013   CLINICAL DATA:  Follow up CVA.  EXAM: CT HEAD WITHOUT CONTRAST  TECHNIQUE: Contiguous axial images were obtained from the base of the skull through the vertex without intravenous contrast.  COMPARISON:  CTA of the head and neck performed 04/21/2013  FINDINGS: The evolving infarct involving portions of the left MCA and ACA  territories is better characterized on the current study, with associated mass effect on the left lateral ventricle but no evidence of midline shift. There is no evidence of hemorrhagic transformation. There is diffuse involvement of the left basal ganglia and partial involvement of the left thalamus, with sparing of the caudate. There is some gyral sparing, with a medial left frontal lobe infarct and more diffuse left frontoparietal region infarct again seen.  Cerebellar atrophy is noted. Scattered small chronic lacunar infarcts are seen within the cerebellar hemispheres bilaterally. Scattered periventricular white matter change likely reflects small vessel ischemic microangiopathy. The brainstem and fourth ventricle are within normal limits.  There is no evidence of fracture; visualized osseous structures are unremarkable in appearance. The orbits are within normal limits. The paranasal sinuses and mastoid air cells are well-aerated. No significant soft tissue abnormalities are seen.  IMPRESSION: 1. Evolving infarct involving portions of the left MCA and ACA territories, better characterized on the current study, with associated mass-effect on the left lateral ventricle but no evidence of midline shift. No evidence of hemorrhagic transformation at this time. Diffuse involvement of the left basal ganglia and partial involvement of the left thalamus. 2. Cerebellar atrophy noted; scattered small chronic lacunar infarcts within the cerebellar hemispheres bilaterally. Scattered small vessel ischemic microangiopathy.   Electronically Signed   By: Roanna Raider M.D.   On: 04/22/2013 03:16   Dg  Abd Portable 1v  04/21/2013   CLINICAL DATA:  Nasogastric tube placement.  EXAM: PORTABLE ABDOMEN - 1 VIEW  COMPARISON:  DG ABD ACUTE W/CHEST dated 07/31/2011  FINDINGS: Weighted tip feeding tube is present in the proximal gastric fundus. Bowel gas pattern appears normal. Contrast fills the urinary bladder. Monitoring leads  project over the chest. Pacemaker leads partially visualized. Motion artifact present over the lower chest.  IMPRESSION: Weighted tip feeding tube in the proximal gastric fundus.   Electronically Signed   By: Andreas NewportGeoffrey  Lamke M.D.   On: 04/21/2013 17:31    Cardiac Studies:  Assessment/Plan:  Acute left frontal parietal MCA /ACA territory infarct with right-sided paralysis  History of recurrent CVA in the past status post recent left internal carotid artery PTA/stenting  Coronary artery disease history of MI in the past status post PCI to RCA in the past  Hypertension  Ischemic cardiomyopathy  Compensated systolic heart failure  Diabetes mellitus controlled by diet  Hypercholesteremia  Peripheral vascular disease status post bilateral BKA in past  Plan Discussed with her daughter regarding CODE STATUS she will discuss with other family members and decided.  Also discussed regarding PEG tube and agrees for a tube.  Will get GI consult Okay to transfer to 4 N.  LOS: 3 days    Robynn PaneMohan N Mardell Suttles 04/23/2013, 11:54 AM

## 2013-04-23 NOTE — Progress Notes (Signed)
Speech Language Pathology Treatment: Dysphagia  Patient Details Name: [name] ParisBilly [name] MRN: [id number] DOB: 1931/10/02 Today's Date: 04/23/2013 Time: [id number] SLP Time Calculation (min): 60 min  Assessment / Plan / Recommendation Clinical Impression  Grand-daughter, [name], observed this session and participated in in-depth education with SLP re: speech and swallowing deficits.  SLP re-assessed swallow at bedside.  Pt. Slightly more alert today, but requires max assist to remain awake and participate.  Patient is non-verbal and does not f/c's (globally aphasic).  Ice chip was placed on tongue.  Pt. Unable to close lips and move tongue to manipulate bolus.  Anterior spillage noted but a swallow was initiated (minimal amount was swallowed).  Positive delayed cough, with probable aspiration. Oral cavity suctioned. SLP placed small (1/4 tsp) amount of applesauce on tongue.  No tongue movement noted, even with max verbal and tactile cues.  Patient was unable to manipulate bolus for oral transit.  Bolus was suctioned from oral cavity.  In-depth conversation with grand-daughter re: potential options.  Currently pt. Is a full code; will need PEG, but is on Plavix with high risk of stroke if ceased (per MD).  Discussed QOL, EOL, and [name] expressed desire for Comfort, but states other family members do not agree and are having difficulty making decisions.  She is willing to meet with Palliative Care and requests that pt's daughter, [name], be main contact for family meeting.  SLP phoned Dr. Sharyn LullHarwani, who [facility].  SLP will f/u once GOC are determined.   HPI HPI: Patient is an 80 y.o. male admitted to hospital secondary to difficulty moving, slurred speech, and left-sided weakness. PMH: CAD, MI, ischemic cardiomyopathy, CHF, complete heart block, s/p permanent pacemaker placement, HTN, non-insulin dependent DM, PVD, s/p BKA, h/o CVA and PTA to middle cerebral artery.   Pertinent Vitals Low grade  fever; LS diminished  SLP Plan   (Defer until PCT determines GOC)    Recommendations Diet recommendations: NPO              Follow up Recommendations: Other (comment) (PCT) Plan:  (Defer until PCT determines GOC)    GO     [name] DerrickLori T Hagan [name] 04/23/2013, 2:36 PM

## 2013-04-23 NOTE — Progress Notes (Signed)
NUTRITION FOLLOW-UP/CONSULT  INTERVENTION: - Jevity 1.2 @ 25 ml/hr, increase by 10 ml every 4 hours to goal rate of 55 ml/hr.  Provides: 1584 kcal, 73 grams protein, and 1065 ml H2O.   NUTRITION DIAGNOSIS: Inadequate oral intake related to inability to eat as evidenced by NPO; ongoing.   Goal: Pt to meet >/= 90% of their estimated nutrition needs   Monitor:  Weights, labs, diet advancement  ASSESSMENT: Pt with history of severe peripheral vascular disease status post bilateral below-knee amputation, hypercholesteremia, recently discharged from the hospital last month following probable recurrent CVA versus reversible ischemic neurological deficit.   Pt unable to pass swallow evaluation, remains NPO, per MD note family wants a PEG tube. Pt aphasic and unresponsive, unable to answer any questions at this time.   Height: Ht Readings from Last 1 Encounters:  04/20/13 4\' 6"  (1.372 m)  Original ht 5\' 8"  (172.7 cm) per past medical records  Weight: Wt Readings from Last 1 Encounters:  04/22/13 151 lb 10.8 oz (68.8 kg)     Estimated Nutritional Needs: Kcal: [id number] Protein: 60-75g Fluid: 1.5-1.7L/day  Skin: Bilateral BKA   Diet Order: NPO   Intake/Output Summary (Last 24 hours) at 04/23/13 1156 Last data filed at 04/23/13 1100  Gross per 24 hour  Intake   1200 ml  Output    560 ml  Net    640 ml    Last BM: PTA  Labs:   Recent Labs Lab 04/20/13 2249 04/21/13 0617  NA 140  --   K 4.0  --   CL 100  --   CO2 26  --   BUN 16  --   CREATININE 1.06 1.01  CALCIUM 9.7  --   GLUCOSE 130*  --     CBG (last 3)   Recent Labs  04/22/13 1555 04/22/13 1938 04/23/13 0830  GLUCAP 89 89 74    Scheduled Meds: . aspirin  324 mg Per Tube Daily  . clopidogrel  75 mg Oral Q breakfast  . heparin  5,000 Units Subcutaneous 3 times per day  . insulin aspart  0-9 Units Subcutaneous TID WC  . nitroGLYCERIN  1 inch Topical 4 times per day  . pantoprazole (PROTONIX) IV   40 mg Intravenous QHS    Continuous Infusions: . sodium chloride 50 mL/hr at 04/23/13 1100  . feeding supplement (JEVITY 1.2 CAL)       Kendell BaneHeather Eilan Mcinerny RD, LDN, CNSC [phone] Pager [phone] After Hours Pager

## 2013-04-23 NOTE — Clinical Social Work Note (Signed)
Clinical Social Work Department BRIEF PSYCHOSOCIAL ASSESSMENT 04/23/2013  Patient:  [name],[name]     Account Number:  [id number]     Admit date:  04/20/2013  Clinical Social Worker:  Verl BlalockSCINTO,JESSE, LCSW  Date/Time:  04/23/2013 09:17 AM  Referred by:  RN  Date Referred:  04/22/2013 Referred for  SNF Placement   Other Referral:   Interview type:  Family Other interview type:   Spoke with patient wife and daughter, [name], over the phone    PSYCHOSOCIAL DATA Living Status:  FACILITY [facility] Level of care:  Skilled Nursing Facility Primary support name:  [name] [phone] / [name] [phone] Primary support relationship to patient:  SPOUSE Degree of support available:   Patient spouse is bed ridden but able to communicate via phone - she has provided verbal permission to speak with both her and patient daughter regarding patient discharge plans    CURRENT CONCERNS Current Concerns  Post-Acute Placement   Other Concerns:    SOCIAL WORK ASSESSMENT / PLAN Clinical Social Worker spoke with patient wife and daughter over the phone to offer support and discuss patient plans at discharge.  Patient daughter states that patient family is unhappy with patient [facility] and would like to [place] a new [place].  CSW spoke with patient wife over the phone to confirm this plan and she remains in agreement. CSW to initiate search and follow up with both patient wife and daughter regarding bed offers.  CSW remains available for family support and to facilitate patient discharge needs once medically stable.   Assessment/plan status:  Psychosocial Support/Ongoing Assessment of Needs Other assessment/ plan:   Information/referral to community resources:   Patient daughter requested facility list once bed offers are available - CSW to provide at appropriate time    PATIENT'S/FAMILY'S  RESPONSE TO PLAN OF CARE: Patient alert and nonverbal, therefore orientation is hard to determine.  Patient seems to have a strong support system with his wife and children.  Per patient daughter, patient wife is bed ridden but able to provide verbal permission and decision making.  CSW spoke with patient wife over the phone who provided verbal permission to continue conversation regarding discharge plans with patient children.  Patient family seems to communicate well with one another and seeking the best interest of patient at this time.  Patient family agreeable with plans to switch SNF at discharge.  Patient spouse and daughter verbalized their appreciation for CSW support and concern.

## 2013-04-23 NOTE — Clinical Social Work Placement (Signed)
Clinical Social Work Department CLINICAL SOCIAL WORK PLACEMENT NOTE 04/23/2013  Patient:  [name],[name]  Account Number:  [id number] Admit date:  04/20/2013  Clinical Social Worker:  Macario GoldsJESSE Astella Desir, LCSW  Date/time:  04/23/2013 09:15 AM  Clinical Social Work is seeking post-discharge placement for this patient at the following level of care:   SKILLED NURSING   (*CSW will update this form in Epic as items are completed)   04/23/2013  Patient/family provided with [facility] Department of Clinical Social Work's list of facilities offering this level of care within the geographic area requested by the patient (or if unable, by the patient's family).  04/23/2013  Patient/family informed of their freedom to choose among providers that offer the needed level of care, that participate in Medicare, Medicaid or managed care program needed by the patient, have an available bed and are willing to accept the patient.  04/23/2013  Patient/family informed of [facility]' ownership interest in [facility], as well as of the fact that they are under no obligation to receive care at this facility.  PASARR submitted to EDS on  PASARR number received from EDS on   FL2 transmitted to all facilities in geographic area requested by pt/family on  04/23/2013 FL2 transmitted to all facilities within larger geographic area on   Patient informed that his/her managed care company has contracts with or will negotiate with  certain facilities, including the following:     Patient/family informed of bed offers received:   Patient chooses bed at  Physician recommends and patient chooses bed at    Patient to be transferred to  on   Patient to be transferred to facility by   The following physician request were entered in Epic:   Additional Comments: 04/08 Patient with pre existing PASARR

## 2013-04-23 NOTE — Progress Notes (Signed)
UR completed.  Penina Reisner, RN BSN [facility] CCM Trauma/Neuro ICU Case Manager [phone]  

## 2013-04-24 ENCOUNTER — Inpatient Hospital Stay (HOSPITAL_COMMUNITY): Payer: Medicare Other

## 2013-04-24 LAB — GLUCOSE, CAPILLARY
GLUCOSE-CAPILLARY: 159 mg/dL — AB (ref 70–99)
Glucose-Capillary: 116 mg/dL — ABNORMAL HIGH (ref 70–99)
Glucose-Capillary: 173 mg/dL — ABNORMAL HIGH (ref 70–99)
Glucose-Capillary: 188 mg/dL — ABNORMAL HIGH (ref 70–99)
Glucose-Capillary: 184 mg/dL — ABNORMAL HIGH (ref 70–99)
Glucose-Capillary: 141 mg/dL — ABNORMAL HIGH (ref 70–99)

## 2013-04-24 MED ORDER — CARVEDILOL 6.25 MG PO TABS
6.2500 mg | ORAL_TABLET | Freq: Two times a day (BID) | ORAL | Status: DC
  Administered 2013-04-24 – 2013-04-25 (×3): 6.25 mg via ORAL
  Filled 2013-04-24 (×7): qty 1

## 2013-04-24 MED ORDER — INSULIN ASPART 100 UNIT/ML ~~LOC~~ SOLN
0.0000 [IU] | SUBCUTANEOUS | Status: DC
  Administered 2013-04-24: 1 [IU] via SUBCUTANEOUS
  Administered 2013-04-25 (×5): 2 [IU] via SUBCUTANEOUS

## 2013-04-24 MED ORDER — ATORVASTATIN CALCIUM 80 MG PO TABS
80.0000 mg | ORAL_TABLET | Freq: Every day | ORAL | Status: DC
  Administered 2013-04-24 – 2013-04-25 (×2): 80 mg via ORAL
  Filled 2013-04-24 (×3): qty 1

## 2013-04-24 MED ORDER — RAMIPRIL 5 MG PO CAPS
5.0000 mg | ORAL_CAPSULE | Freq: Every day | ORAL | Status: DC
  Administered 2013-04-24 – 2013-04-25 (×2): 5 mg via ORAL
  Filled 2013-04-24 (×3): qty 1

## 2013-04-24 NOTE — Progress Notes (Signed)
Stroke Team Progress Note  HISTORY [name] is a 80 y.o. male with a history of recent right brain stroke in March who underwent a left carotid stent placement following his rehab stay. He has been on dual antiplatelet therapy. He was noticed to have a change around 1pm today 04/20/2013 reported as "increased confusion." Around 8pm, his family arrived and noticed that he was not talking and therefore insisted he be brought to the emergency room. Patient was not administered TPA secondary to recent stroke, outside of window. He was admitted for further evaluation and treatment.  SUBJECTIVE No family at bedside.  OBJECTIVE Most recent Vital Signs: Filed Vitals:   04/24/13 0100 04/24/13 0128 04/24/13 0314 04/24/13 0518  BP: 143/66  171/73 175/73  Pulse: 79  82 78  Temp:  99.2 F (37.3 C) 97.5 F (36.4 C) 99.2 F (37.3 C)  TempSrc:  Oral Axillary Oral  Resp: 22 20 18 18   Height:      Weight:      SpO2: 95% 98% 94% 97%   CBG (last 3)   Recent Labs  04/23/13 2322 04/24/13 0521 04/24/13 0745  GLUCAP 116* 159* 173*    IV Fluid Intake:   . sodium chloride 50 mL/hr at 04/24/13 0100  . feeding supplement (JEVITY 1.2 CAL) 55 mL/hr at 04/24/13 0345    MEDICATIONS  . aspirin  324 mg Per Tube Daily  . atorvastatin  80 mg Oral Daily  . carvedilol  6.25 mg Oral BID WC  . clopidogrel  75 mg Oral Q breakfast  . heparin  5,000 Units Subcutaneous 3 times per day  . insulin aspart  0-9 Units Subcutaneous TID WC  . nitroGLYCERIN  1 inch Topical 4 times per day  . pantoprazole (PROTONIX) IV  40 mg Intravenous QHS  . ramipril  5 mg Oral Daily   PRN:  senna-docusate  Diet:  NPO  Activity:  OOB DVT Prophylaxis:  Heparin 5000 units sq tid   CLINICALLY SIGNIFICANT STUDIES Basic Metabolic Panel:   Recent Labs Lab 04/20/13 2249 04/21/13 0617  NA 140  --   K 4.0  --   CL 100  --   CO2 26  --   GLUCOSE 130*  --   BUN 16  --   CREATININE 1.06 1.01  CALCIUM 9.7  --    Liver  Function Tests:   Recent Labs Lab 04/20/13 2249  AST 26  ALT 17  ALKPHOS 134*  BILITOT 0.4  PROT 7.7  ALBUMIN 3.5   CBC:   Recent Labs Lab 04/20/13 2249 04/21/13 0617  WBC 7.8 7.5  NEUTROABS 6.3  --   HGB 10.1* 10.0*  HCT 30.3* 29.9*  MCV 86.1 85.9  PLT 297 266   Coagulation:   Recent Labs Lab 04/20/13 2249  LABPROT 13.6  INR 1.06   Cardiac Enzymes: No results found for this basename: CKTOTAL, CKMB, CKMBINDEX, TROPONINI,  in the last 168 hours Urinalysis:   Recent Labs Lab 04/21/13 0253  COLORURINE YELLOW  LABSPEC 1.020  PHURINE 5.0  GLUCOSEU NEGATIVE  HGBUR NEGATIVE  BILIRUBINUR NEGATIVE  KETONESUR NEGATIVE  PROTEINUR NEGATIVE  UROBILINOGEN 0.2  NITRITE NEGATIVE  LEUKOCYTESUR NEGATIVE   Lipid Panel    Component Value Date/Time   CHOL 125 04/04/2013 0208   TRIG 49 04/04/2013 0208   HDL 52 04/04/2013 0208   CHOLHDL 2.4 04/04/2013 0208   VLDL 10 04/04/2013 0208   LDLCALC 63 04/04/2013 0208   HgbA1C  Lab Results  Component Value Date   HGBA1C 6.8* 04/04/2013    Urine Drug Screen:     Component Value Date/Time   LABOPIA NONE DETECTED 04/03/2013 0936   COCAINSCRNUR NONE DETECTED 04/03/2013 0936   LABBENZ NONE DETECTED 04/03/2013 0936   AMPHETMU NONE DETECTED 04/03/2013 0936   THCU NONE DETECTED 04/03/2013 0936   LABBARB NONE DETECTED 04/03/2013 0936    Alcohol Level: No results found for this basename: ETH,  in the last 168 hours   CT of the brain  04/20/2013   Subtle low-attenuation with mild local mass effect over the left MCA territory of the frontal parietal region suggesting acute infarction. No evidence of acute hemorrhage.  Chronic ischemic microvascular disease and age related atrophy.     CT angio of the brain  04/21/2013   1. Markedly limited study due to motion. There is question of abrupt occlusion of the mid left M1 segment. Flow within the distal left MCA branches is somewhat attenuated, and may be via leptomeningeal collateralization. 2. Acute  ischemic left MCA territory infarct involving the left frontoparietal region, stable relative to prior CT performed earlier. 3. No other proximal branch occlusion, or definite high-grade flow-limiting stenosis identified within the intracranial circulation.    CT angio of the neck  04/21/2013     1. Markedly limited study due to motion. There is flow within the left internal carotid artery distal to the carotid stent, with flow within the stent itself not well evaluated on this examination due to extensive motion. Further evaluation with carotid Doppler ultrasound and/or catheter directed angiogram to evaluate stent patency recommended. 2. No other high-grade flow-limiting stenosis, occlusion, or dissection identified within the neck. 3. Enlarged multinodular goiter.   MRI/A of the brain  pacer  2D Echocardiogram  04/04/2013  Ejection fraction 25-30%. No cardiac source of emboli was identified.  Carotid Doppler  Bilateral: 1-39% ICA stenosis. Vertebral artery flow is antegrade. Left ICA stent is patent. No evidence of thrombus.  TCD  Mildy elevated right middle cerebral and basilar artery mean flow velocities of unclear significance. Globally elevated pulsatility indices suggest diffuse intracranial atherosclerosis.   CXR  04/21/2013    No acute cardiopulmonary process seen.     EKG  normal sinus rhythm. For complete results please see formal report.   Therapy Recommendations SNF  Physical Exam   Frail cachectic african american male not in distress. . Afebrile. Head is nontraumatic. Neck is supple without bruit.  . Cardiac exam no murmur or gallop. Lungs are clear to auscultation. Distal pulses are not felt in LE due to bilateral BKA Neurological Exam : . Drowsy but can be aroused. Eyes are closed. Will not follow commands. Not speaking. Forced left gaze deviation. Pupils irregular but reactive. Doll's eye movement absent. Right lower facial weakness. Tongue midline. Dense right hemiplegia with  decreased tone. Able to move left upper and lower extremity against gravity to sternal rub. Normal tone on the left. Plantars cannot be checked.  ASSESSMENT [name] is a 80 y.o. male presenting from the NH with aphasia. Imaging confirms a new left brain infarct, associated with L M2 stenosis seen on recent angio, likely clot formed there and led to stroke. Recent incidental L ICA stenosis with stent placed 04/11/2013 proximal L ICA. Patient with right brain stroke 03/25/2013. aspirin 81 mg orally every day and clopidogrel 75 mg orally every day prior to admission. Now on aspirin 81 mg orally every day and clopidogrel 75 mg orally every day for secondary  stroke prevention, though patient unable to swallow at this time. Patient with resultant stuporous state that is improving (more awake), right hemiparesis, global aphasia (still not speaking or following commands), dysphagia (failed ST swallow eval).   Dysphagia  Panda placed w/ TF started 4/8 Right brain stroke 03/25/2013, dysarthria and left hemiparesis, discharged to rehabilitation Prior stroke Hyperlipidemia, LDL 63 - on Lipitor prior to admission  Permanent pacemaker  Coronary artery disease  Bilateral lower extremity amputations  Hypertension  Left ventricular dysfunction - ejection fraction 25-30% by 2-D echo  Hospital day # 4  TREATMENT/PLAN  Continue aspirin 81 mg orally every day and clopidogrel 75 mg orally every day for secondary stroke prevention as panda placed.   Agree with plans for Palliative care meeting scheduled for Sat to address plan of care Nothing futher to add from the stroke team Patient has a 10-15% risk of having another stroke over the next year, the highest risk is within 2 weeks of the most recent stroke/TIA (risk of having a stroke following a stroke or TIA is the same). Ongoing risk factor control by Primary Care Physician Stroke Service will sign off. Please call should any needs arise. Follow up with  Dr. Pearlean Brownie, Stroke Clinic, in 2 months.  Annie Main, MSN, RN, ANVP-BC, AGPCNP-BC [facility] Pager: [phone] 04/24/2013 10:10 AM  I have personally obtained a history, examined the patient, evaluated imaging results, and formulated the assessment and plan of care. I agree with the above.  Delia Heady, MD  To contact Stroke Continuity provider, please refer to [email or web address]. After hours, contact General Neurology

## 2013-04-24 NOTE — Progress Notes (Signed)
PT Cancellation Note  Patient Details Name: [name] MRN: [id number] DOB: 04/28/1931   Cancelled Treatment:    Reason Eval/Treat Not Completed: Fatigue/lethargy limiting ability to participate;Medical issues which prohibited therapy. Patient having difficulty arousing to stimuli. Patients family to have palliative care meeting on Saturday. Per NP ok to await that result and follow up Monday to determine if PT in appropriate.    Adline PotterJulia Elizabeth Robinette 04/24/2013, 11:20 AM

## 2013-04-24 NOTE — Progress Notes (Signed)
Subjective:  Awake aphasic does not follow any commands. Tolerating NG tube feeding. Discussed with GI feels reluctant as patient is on Plavix for PEG tube. Discussed with daughter agrees to talk with palliative care family has not decided yet regarding CODE STATUS  Objective:  Vital Signs in the last 24 hours: Temp:  [97.5 F (36.4 C)-99.2 F (37.3 C)] 99.2 F (37.3 C) (04/09 0518) Pulse Rate:  [60-91] 78 (04/09 0518) Resp:  [10-29] 18 (04/09 0518) BP: (137-185)/(55-109) 175/73 mmHg (04/09 0518) SpO2:  [91 %-100 %] 97 % (04/09 0518) Weight:  [71 kg (156 lb 8.4 oz)] 71 kg (156 lb 8.4 oz) (04/08 1951)  Intake/Output from previous day: 04/08 0701 - 04/09 0700 In: 1235.1 [I.V.:900; NG/GT:335.1] Out: 650 [Urine:650] Intake/Output from this shift:    Physical Exam: Neck: no adenopathy, no carotid bruit, no JVD and supple, symmetrical, trachea midline Lungs: Decreased breath sound at bases Heart: regular rate and rhythm, S1: Soft systolic murmur noted and No S3 gallop Abdomen: soft, non-tender; bowel sounds normal; no masses,  no organomegaly Extremities: Status post bilateral BKA  Lab Results: No results found for this basename: WBC, HGB, PLT,  in the last 72 hours No results found for this basename: NA, K, CL, CO2, GLUCOSE, BUN, CREATININE,  in the last 72 hours No results found for this basename: TROPONINI, CK, MB,  in the last 72 hours Hepatic Function Panel No results found for this basename: PROT, ALBUMIN, AST, ALT, ALKPHOS, BILITOT, BILIDIR, IBILI,  in the last 72 hours No results found for this basename: CHOL,  in the last 72 hours No results found for this basename: PROTIME,  in the last 72 hours  Imaging: Imaging results have been reviewed and No results found.  Cardiac Studies:  Assessment/Plan:  Acute left frontal parietal MCA /ACA territory infarct with right-sided paralysis  History of recurrent CVA in the past status post recent left internal carotid artery  PTA/stenting  Coronary artery disease history of MI in the past status post PCI to RCA in the past  Hypertension  Ischemic cardiomyopathy  Compensated systolic heart failure  Diabetes mellitus controlled by diet  Hypercholesteremia  Peripheral vascular disease status post bilateral BKA in past  Plan Continue present management Palliative care team consult to discuss with family  LOS: 4 days    Robynn PaneMohan N Nickolaos Brallier 04/24/2013, 9:22 AM

## 2013-04-24 NOTE — Progress Notes (Signed)
Palliative medicine team has contacted family who is requesting a meeting Saturday 04/26/13 at 9 am so all members may be involved in discussion and planning. Thank you for this consult.   Yong ChannelAlicia Karima Carrell, NP Palliative Medicine Team Pager # [phone] (M-F 8a-5p) Team Phone # [phone] (Nights/Weekends)

## 2013-04-24 NOTE — Progress Notes (Signed)
Pt pulled Panda tube feeding out despite LUE mittens on. Feeding tube reinserted and KUB ordered STAT for placement. Dr Sharyn LullHarwani called and made aware. Restraints order received to prevent interfering with nutritional means. Family not available at this time.

## 2013-04-25 ENCOUNTER — Inpatient Hospital Stay (HOSPITAL_COMMUNITY): Payer: Medicare Other

## 2013-04-25 LAB — GLUCOSE, CAPILLARY
GLUCOSE-CAPILLARY: 165 mg/dL — AB (ref 70–99)
GLUCOSE-CAPILLARY: 168 mg/dL — AB (ref 70–99)
Glucose-Capillary: 173 mg/dL — ABNORMAL HIGH (ref 70–99)
Glucose-Capillary: 185 mg/dL — ABNORMAL HIGH (ref 70–99)
Glucose-Capillary: 192 mg/dL — ABNORMAL HIGH (ref 70–99)
Glucose-Capillary: 147 mg/dL — ABNORMAL HIGH (ref 70–99)

## 2013-04-25 NOTE — Progress Notes (Addendum)
Found patient left-sided  leaning forward in bed,  panda feeding tube pulled although extremity restrained. Noted feeding product within oral cavity and draining on chest, and coughing vigorously. Feeding discontinued and coreg due held.  Suctioned and mouth care provided. Dr Sharyn LullHarwani paged and Dr. Algie Cofferkadakia returned call and came in to assess. New orders received.

## 2013-04-25 NOTE — Progress Notes (Signed)
Subjective:  Awake aphasic do not follow commands. Right-sided paralysis remains.  Objective:  Vital Signs in the last 24 hours: Temp:  [98.1 F (36.7 C)-98.8 F (37.1 C)] 98.8 F (37.1 C) (04/10 0757) Pulse Rate:  [60-67] 62 (04/10 0757) Resp:  [18-20] 18 (04/10 0757) BP: (146-170)/(57-85) 170/62 mmHg (04/10 0757) SpO2:  [96 %-100 %] 100 % (04/10 0757) Weight:  [72.757 kg (160 lb 6.4 oz)] 72.757 kg (160 lb 6.4 oz) (04/10 0456)  Intake/Output from previous day:   Intake/Output from this shift: Total I/O In: 3227.5 [I.V.:1550; NG/GT:1677.5] Out: 625 [Urine:625]  Physical Exam: Neck: no adenopathy, no carotid bruit, no JVD and supple, symmetrical, trachea midline Lungs: Decrease breath sound at bases Heart: regular rate and rhythm, S1, S2 normal and Soft systolic murmur noted no S3 gallop Abdomen: soft, non-tender; bowel sounds normal; no masses,  no organomegaly Extremities: Status post bilateral BKA Neurologic: Mental status: Essentially unchanged exam essentially unchanged  Lab Results: No results found for this basename: WBC, HGB, PLT,  in the last 72 hours No results found for this basename: NA, K, CL, CO2, GLUCOSE, BUN, CREATININE,  in the last 72 hours No results found for this basename: TROPONINI, CK, MB,  in the last 72 hours Hepatic Function Panel No results found for this basename: PROT, ALBUMIN, AST, ALT, ALKPHOS, BILITOT, BILIDIR, IBILI,  in the last 72 hours No results found for this basename: CHOL,  in the last 72 hours No results found for this basename: PROTIME,  in the last 72 hours  Imaging: Imaging results have been reviewed and Dg Abd Portable 1v  04/24/2013   CLINICAL DATA:  Feeding tube placement  EXAM: PORTABLE ABDOMEN - 1 VIEW  COMPARISON:  April 21, 2013  FINDINGS: Feeding tube tip is in the stomach. Bowel gas pattern is unremarkable. No obstruction or free air is seen on this supine examination. There is degenerative change in the lumbar spine.   IMPRESSION: Feeding tube tip in stomach.   Electronically Signed   By: Bretta BangWilliam  Woodruff M.D.   On: 04/24/2013 20:31    Cardiac Studies:  Assessment/Plan:  Acute left frontal parietal MCA /ACA territory infarct with right-sided paralysis  History of recurrent CVA in the past status post recent left internal carotid artery PTA/stenting  Coronary artery disease history of MI in the past status post PCI to RCA in the past  Hypertension  Ischemic cardiomyopathy  Compensated systolic heart failure  Diabetes mellitus controlled by diet  Hypercholesteremia  Peripheral vascular disease status post bilateral BKA in past  Plan Continue present supportive care Family has meeting with hospice tomorrow at 9 Dr. Algie CofferKadakia on-call for weekend  LOS: 5 days    [name] 04/25/2013, 12:16 PM

## 2013-04-25 NOTE — Progress Notes (Signed)
Ref: Robynn Pane, MD   Subjective:  Called to see patient. Apparently pulled feeding tube again. Coughing. 100 cc of fluid recovered on suctioning. Feeding discontinued.  Objective:  Vital Signs in the last 24 hours: Temp:  [98.2 F (36.8 C)-98.8 F (37.1 C)] 98.8 F (37.1 C) (04/10 1810) Pulse Rate:  [59-72] 72 (04/10 1810) Cardiac Rhythm:  [-] Ventricular paced (04/10 0800) Resp:  [18-20] 20 (04/10 1810) BP: (135-170)/(50-85) 135/50 mmHg (04/10 1810) SpO2:  [96 %-100 %] 100 % (04/10 1810) Weight:  [72.757 kg (160 lb 6.4 oz)] 72.757 kg (160 lb 6.4 oz) (04/10 0456)  Physical Exam: BP Readings from Last 1 Encounters:  04/25/13 135/50    Wt Readings from Last 1 Encounters:  04/25/13 72.757 kg (160 lb 6.4 oz)    Weight change: 1.757 kg (3 lb 14 oz)  HEENT: [place]/AT, Eyes-Brown, Conjunctiva-Pale pink, Sclera-Non-icteric Neck: No JVD, No bruit, Trachea midline. Lungs:  Coarse crackles, Bilateral. Cardiac:  Regular rhythm, normal S1 and S2, no S3.  Abdomen:  Soft, non-tender. Extremities:  No edema present. Bilateral BKA. CNS: AxOx0, Right side flaccid paralysis. Skin: Warm and dry.   Intake/Output from previous day:      Lab Results: BMET    Component Value Date/Time   NA 140 04/20/2013 2249   NA 140 04/15/2013 0918   NA 140 04/12/2013 0340   K 4.0 04/20/2013 2249   K 4.1 04/15/2013 0918   K 4.1 04/12/2013 0340   CL 100 04/20/2013 2249   CL 106 04/15/2013 0918   CL 105 04/12/2013 0340   CO2 26 04/20/2013 2249   CO2 21 04/15/2013 0918   CO2 23 04/12/2013 0340   GLUCOSE 130* 04/20/2013 2249   GLUCOSE 155* 04/15/2013 0918   GLUCOSE 93 04/12/2013 0340   BUN 16 04/20/2013 2249   BUN 14 04/15/2013 0918   BUN 13 04/12/2013 0340   CREATININE 1.01 04/21/2013 0617   CREATININE 1.06 04/20/2013 2249   CREATININE 1.03 04/15/2013 0918   CALCIUM 9.7 04/20/2013 2249   CALCIUM 9.2 04/15/2013 0918   CALCIUM 9.3 04/12/2013 0340   GFRNONAA 68* 04/21/2013 0617   GFRNONAA 64* 04/20/2013 2249   GFRNONAA 66*  04/15/2013 0918   GFRAA 79* 04/21/2013 0617   GFRAA 74* 04/20/2013 2249   GFRAA 77* 04/15/2013 0918   CBC    Component Value Date/Time   WBC 7.5 04/21/2013 0617   RBC 3.48* 04/21/2013 0617   HGB 10.0* 04/21/2013 0617   HCT 29.9* 04/21/2013 0617   PLT 266 04/21/2013 0617   MCV 85.9 04/21/2013 0617   MCH 28.7 04/21/2013 0617   MCHC 33.4 04/21/2013 0617   RDW 16.8* 04/21/2013 0617   LYMPHSABS 0.9 04/20/2013 2249   MONOABS 0.4 04/20/2013 2249   EOSABS 0.1 04/20/2013 2249   BASOSABS 0.0 04/20/2013 2249   HEPATIC Function Panel  Recent Labs  04/03/13 0920 04/11/13 0555 04/20/13 2249  PROT 7.1 7.3 7.7   HEMOGLOBIN A1C No components found with this basename: HGA1C,  MPG   CARDIAC ENZYMES Lab Results  Component Value Date   CKTOTAL 81 09/21/2009   CKMB 1.5 09/21/2009   TROPONINI  Value: 0.22        PERSISTENTLY INCREASED TROPONIN VALUES IN THE RANGE OF 0.06-0.49 ng/mL CAN BE SEEN IN:       -UNSTABLE ANGINA       -CONGESTIVE HEART FAILURE       -MYOCARDITIS       -CHEST TRAUMA       -  ARRYHTHMIAS       -LATE PRESENTING MI       -COPD   CLINICAL FOLLOW-UP RECOMMENDED.* 09/21/2009   TROPONINI  Value: 0.67        POSSIBLE MYOCARDIAL ISCHEMIA. SERIAL TESTING RECOMMENDED. CRITICAL VALUE NOTED.  VALUE IS CONSISTENT WITH PREVIOUSLY REPORTED AND CALLED VALUE.* 09/19/2009   TROPONINI  Value: 1.04        POSSIBLE MYOCARDIAL ISCHEMIA. SERIAL TESTING RECOMMENDED. CRITICAL VALUE NOTED.  VALUE IS CONSISTENT WITH PREVIOUSLY REPORTED AND CALLED VALUE.* 09/18/2009   BNP No results found for this basename: PROBNP,  in the last 8760 hours TSH No results found for this basename: TSH,  in the last 8760 hours CHOLESTEROL  Recent Labs  04/04/13 0208  CHOL 125    Scheduled Meds: . aspirin  324 mg Per Tube Daily  . atorvastatin  80 mg Oral Daily  . carvedilol  6.25 mg Oral BID WC  . clopidogrel  75 mg Oral Q breakfast  . heparin  5,000 Units Subcutaneous 3 times per day  . insulin aspart  0-9 Units Subcutaneous 6 times per day  .  nitroGLYCERIN  1 inch Topical 4 times per day  . pantoprazole (PROTONIX) IV  40 mg Intravenous QHS  . ramipril  5 mg Oral Daily   Continuous Infusions: . sodium chloride 50 mL/hr at 04/24/13 1936  . feeding supplement (JEVITY 1.2 CAL) 55 mL/hr at 04/25/13 1121   PRN Meds:.senna-docusate  Assessment/Plan: Acute aspiration r/o pneumonia Acute left frontal parietal MCA /ACA territory infarct with right-sided paralysis  History of recurrent CVA in the past status post recent left internal carotid artery PTA/stenting  Coronary artery disease history of MI in the past status post PCI to RCA in the past  Hypertension  Ischemic cardiomyopathy  Compensated systolic heart failure  Diabetes mellitus controlled by diet  Hypercholesteremia  Peripheral vascular disease status post bilateral BKA in past   Plan Chest X-Ray, portable. Prn suction. Prognosis remains poor.     LOS: 5 days    Orpah CobbAjay Chyenne Sobczak  MD  04/25/2013, 6:39 PM

## 2013-04-25 NOTE — Progress Notes (Signed)
SLP Cancellation Note  Patient Details Name: [name] MRN: [id number] DOB: [birth year]   Cancelled treatment:        Per RN, pt status is unchanged. Pt poorly arousable, not verbalizing or following commands.  TF for nutrition.  Palliative care meeting scheduled for tomorrow morning to establish GOC. ST to follow up first of the week.  Available if needed before that time.  Celia B. SomersetBueche, [facility] Surgicenter LLCMSP, CCC-SLP [phone]  Gray BernhardtCelia B Bueche 04/25/2013, 5:18 PM

## 2013-04-26 DIAGNOSIS — Z66 Do not resuscitate: Secondary | ICD-10-CM | POA: Diagnosis not present

## 2013-04-26 DIAGNOSIS — Z515 Encounter for palliative care: Secondary | ICD-10-CM

## 2013-04-26 LAB — GLUCOSE, CAPILLARY
Glucose-Capillary: 126 mg/dL — ABNORMAL HIGH (ref 70–99)
Glucose-Capillary: 127 mg/dL — ABNORMAL HIGH (ref 70–99)
Glucose-Capillary: 107 mg/dL — ABNORMAL HIGH (ref 70–99)
Glucose-Capillary: 109 mg/dL — ABNORMAL HIGH (ref 70–99)

## 2013-04-26 MED ORDER — MORPHINE SULFATE 2 MG/ML IJ SOLN: 2.0000 mg | INTRAMUSCULAR | Status: DC | PRN

## 2013-04-26 MED ORDER — CLOPIDOGREL 45 MG/30 ML ORAL SUSPENSION
45.0000 mg | Freq: Every day | ORAL | Status: DC
  Filled 2013-04-26: qty 30

## 2013-04-26 MED ORDER — OXYCODONE HCL 20 MG/ML PO CONC
5.0000 mg | Freq: Three times a day (TID) | ORAL | Status: DC
  Administered 2013-04-26 – 2013-04-28 (×6): 5 mg via SUBLINGUAL
  Filled 2013-04-26 (×6): qty 1

## 2013-04-26 MED ORDER — CLOPIDOGREL BISULFATE 75 MG PO TABS
45.0000 mg | ORAL_TABLET | Freq: Every day | ORAL | Status: DC
  Filled 2013-04-26: qty 0.5

## 2013-04-26 MED ORDER — NITROGLYCERIN 0.2 MG/HR TD PT24
0.2000 mg | MEDICATED_PATCH | Freq: Every day | TRANSDERMAL | Status: DC
  Administered 2013-04-26 – 2013-04-30 (×5): 0.2 mg via TRANSDERMAL
  Filled 2013-04-26 (×6): qty 1

## 2013-04-26 MED ORDER — DEXTROSE-NACL 5-0.45 % IV SOLN
INTRAVENOUS | Status: DC
  Administered 2013-04-26: 12:00:00 via INTRAVENOUS
  Administered 2013-04-28: 1000 mL via INTRAVENOUS

## 2013-04-26 MED ORDER — CLOPIDOGREL 5 MG/ML PEDIATRIC ORAL SUSPENSION
37.5000 mg | Freq: Every day | ORAL | Status: DC
  Administered 2013-04-26 – 2013-04-28 (×2): 37.5 mg via ORAL
  Filled 2013-04-26 (×5): qty 7.5

## 2013-04-26 MED ORDER — DIAZEPAM 5 MG/ML IJ SOLN
5.0000 mg | Freq: Every day | INTRAMUSCULAR | Status: DC
  Administered 2013-04-26 – 2013-04-29 (×4): 5 mg via INTRAVENOUS
  Filled 2013-04-26 (×3): qty 2

## 2013-04-26 MED ORDER — CLONIDINE HCL 0.1 MG/24HR TD PTWK
0.1000 mg | MEDICATED_PATCH | TRANSDERMAL | Status: DC
  Administered 2013-04-26: 0.1 mg via TRANSDERMAL
  Filled 2013-04-26: qty 1

## 2013-04-26 MED ORDER — DIAZEPAM 5 MG/ML IJ SOLN
2.5000 mg | INTRAMUSCULAR | Status: DC | PRN
  Filled 2013-04-26 (×2): qty 2

## 2013-04-26 MED ORDER — SCOPOLAMINE 1 MG/3DAYS TD PT72
1.0000 | MEDICATED_PATCH | TRANSDERMAL | Status: DC
  Administered 2013-04-26 – 2013-04-29 (×2): 1.5 mg via TRANSDERMAL
  Filled 2013-04-26 (×2): qty 1

## 2013-04-26 NOTE — Progress Notes (Signed)
Ref: Robynn Pane, MD  Subjective:  No coughing. Follows simple commands like "raise your left arm". Remains aphasic. Chest x-ray negative for pneumonia.  Objective:  Vital Signs in the last 24 hours: Temp:  [98.2 F (36.8 C)-99.3 F (37.4 C)] 98.5 F (36.9 C) (04/11 0936) Pulse Rate:  [56-84] 56 (04/11 0936) Cardiac Rhythm:  [-] Ventricular paced (04/11 0835) Resp:  [18-20] 18 (04/11 0936) BP: (135-153)/(50-86) 153/70 mmHg (04/11 0936) SpO2:  [98 %-100 %] 100 % (04/11 0936) Weight:  [72.802 kg (160 lb 8 oz)] 72.802 kg (160 lb 8 oz) (04/11 0532)  Physical Exam: BP Readings from Last 1 Encounters:  04/26/13 153/70    Wt Readings from Last 1 Encounters:  04/26/13 72.802 kg (160 lb 8 oz)    Weight change: 0.045 kg (1.6 oz)  HEENT: /AT, Eyes-Brown, Conjunctiva-Pale pink, Sclera-Non-icteric. Mouth halfway open with drooling. Neck: No JVD, No bruit, Trachea midline. Lungs:  Clearing, Bilateral. Cardiac:  Regular rhythm, normal S1 and S2, no S3.  Abdomen:  Soft, non-tender. Extremities:  No edema present. BKA, bilateral. CNS: AxOx? 1, moves left upper extremity. Skin: Warm and dry.   Intake/Output from previous day: 04/10 0701 - 04/11 0700 In: 3647.5 [I.V.:1750; NG/GT:1897.5] Out: 625 [Urine:625]    Lab Results: BMET    Component Value Date/Time   NA 140 04/20/2013 2249   NA 140 04/15/2013 0918   NA 140 04/12/2013 0340   K 4.0 04/20/2013 2249   K 4.1 04/15/2013 0918   K 4.1 04/12/2013 0340   CL 100 04/20/2013 2249   CL 106 04/15/2013 0918   CL 105 04/12/2013 0340   CO2 26 04/20/2013 2249   CO2 21 04/15/2013 0918   CO2 23 04/12/2013 0340   GLUCOSE 130* 04/20/2013 2249   GLUCOSE 155* 04/15/2013 0918   GLUCOSE 93 04/12/2013 0340   BUN 16 04/20/2013 2249   BUN 14 04/15/2013 0918   BUN 13 04/12/2013 0340   CREATININE 1.01 04/21/2013 0617   CREATININE 1.06 04/20/2013 2249   CREATININE 1.03 04/15/2013 0918   CALCIUM 9.7 04/20/2013 2249   CALCIUM 9.2 04/15/2013 0918   CALCIUM 9.3 04/12/2013  0340   GFRNONAA 68* 04/21/2013 0617   GFRNONAA 64* 04/20/2013 2249   GFRNONAA 66* 04/15/2013 0918   GFRAA 79* 04/21/2013 0617   GFRAA 74* 04/20/2013 2249   GFRAA 77* 04/15/2013 0918   CBC    Component Value Date/Time   WBC 7.5 04/21/2013 0617   RBC 3.48* 04/21/2013 0617   HGB 10.0* 04/21/2013 0617   HCT 29.9* 04/21/2013 0617   PLT 266 04/21/2013 0617   MCV 85.9 04/21/2013 0617   MCH 28.7 04/21/2013 0617   MCHC 33.4 04/21/2013 0617   RDW 16.8* 04/21/2013 0617   LYMPHSABS 0.9 04/20/2013 2249   MONOABS 0.4 04/20/2013 2249   EOSABS 0.1 04/20/2013 2249   BASOSABS 0.0 04/20/2013 2249   HEPATIC Function Panel  Recent Labs  04/03/13 0920 04/11/13 0555 04/20/13 2249  PROT 7.1 7.3 7.7   HEMOGLOBIN A1C No components found with this basename: HGA1C,  MPG   CARDIAC ENZYMES Lab Results  Component Value Date   CKTOTAL 81 09/21/2009   CKMB 1.5 09/21/2009   TROPONINI  Value: 0.22        PERSISTENTLY INCREASED TROPONIN VALUES IN THE RANGE OF 0.06-0.49 ng/mL CAN BE SEEN IN:       -UNSTABLE ANGINA       -CONGESTIVE HEART FAILURE       -MYOCARDITIS       -  CHEST TRAUMA       -ARRYHTHMIAS       -LATE PRESENTING MI       -COPD   CLINICAL FOLLOW-UP RECOMMENDED.* 09/21/2009   TROPONINI  Value: 0.67        POSSIBLE MYOCARDIAL ISCHEMIA. SERIAL TESTING RECOMMENDED. CRITICAL VALUE NOTED.  VALUE IS CONSISTENT WITH PREVIOUSLY REPORTED AND CALLED VALUE.* 09/19/2009   TROPONINI  Value: 1.04        POSSIBLE MYOCARDIAL ISCHEMIA. SERIAL TESTING RECOMMENDED. CRITICAL VALUE NOTED.  VALUE IS CONSISTENT WITH PREVIOUSLY REPORTED AND CALLED VALUE.* 09/18/2009   BNP No results found for this basename: PROBNP,  in the last 8760 hours TSH No results found for this basename: TSH,  in the last 8760 hours CHOLESTEROL  Recent Labs  04/04/13 0208  CHOL 125    Scheduled Meds: . cloNIDine  0.1 mg Transdermal Weekly  . [START ON 04/27/2013] clopidogrel  45 mg Oral Q breakfast  . diazepam  5 mg Intravenous QHS  . nitroGLYCERIN  0.2 mg Transdermal Daily   . oxyCODONE  5 mg Sublingual 3 times per day  . scopolamine  1 patch Transdermal Q72H   Continuous Infusions:  PRN Meds:.diazepam, morphine injection  Assessment/Plan:  Acute aspiration / pneumonia ruled out.  Acute left frontal parietal MCA /ACA territory infarct with right-sided paralysis  History of recurrent CVA in the past status post recent left internal carotid artery PTA/stenting  Coronary artery disease history of MI in the past status post PCI to RCA in the past  Hypertension  Ischemic cardiomyopathy  Compensated systolic heart failure  Diabetes mellitus controlled by diet  Hypercholesteremia  Peripheral vascular disease status post bilateral BKA in past   Plan Hospice care and full comfort care per family. IV D5 1/2 NS. Resume feeding from tomorrow.    LOS: 6 days    Orpah CobbAjay Reni Hausner  MD  04/26/2013, 10:59 AM

## 2013-04-26 NOTE — Consult Note (Addendum)
Palliative Medicine  Consult Note  80 yo with new large left MCA stroke, prior right parietal stroke, cerebellar strokes and overall diffuse cerebrovascular disease in setting of multiple co-morbidities including PVD with amputation and CHF with an EF of 25%. He has irreversible an severely debilitating deficits related to this stroke-aphasic, terminal dysphagia, encephalopathy, agitation and immobility.  I met with the majority of his close family including his Wife by phone Cleda Mccreedy), his daughter Sherron Flemings, son Leory by phone, son Venora Maples, his youngest daughter, and two other daughters, 2 grand-daughters also present.  I explained his current condition, anticipated disease trajectory and options for his care moving forward.  They elect for full comfort care. I explain this approach in detail including no feeding tubes, no prolonged IV hydration, careful hand feeding when possible, keeping him clean, dry and well cared for during his remaining time in order to maintain dignity and comfort given his large stroke and irreversible debility.  I recommended a [facility] for [facility]- family specifically requesting [facility]- several family members in various healthcare fields and have prior good experiences there.   1. DNR 2. Comfort Feeding, if tolerated 3. Diazepam scheduled at PM for seizure prophylaxis and PRN for agitation 4. Scheduled low dose oxycodone SL for pain and morphine prn IV for dyspnea or distress 5. [place] patch for secretions 6. His mostly unable to take oral meds-I have changed his plavix to a suspension- if he can take it we will give this- will not change prognosis.Stopped the ASA. Transitioned him to a daily NTG patch and Clonidine for his hypertension. Stop Po meds. 7. NSL 8. No restraints  Referral to [facility].  Prognosis: <2 weeks.  Time in: 900AM Time out: 10:30AM  90 minutes. Greater than 50%  of this time was spent counseling and coordinating care related to  the above assessment and plan.   Lane Hacker, DO Palliative Medicine [phone]

## 2013-04-26 NOTE — Progress Notes (Signed)
Pt showed signs of aspiration with administration of oral suspension Plavix.

## 2013-04-27 NOTE — Progress Notes (Signed)
Upon assessment, left-sided facial droop noted.  This is different from previous assessments.

## 2013-04-27 NOTE — Progress Notes (Signed)
Ref: Robynn Pane, MD  Subjective:  Poorly responding this AM. Afebrile. No cough.  Objective:  Vital Signs in the last 24 hours: Temp:  [98.5 F (36.9 C)-99 F (37.2 C)] 98.6 F (37 C) (04/12 0517) Pulse Rate:  [56-80] 80 (04/12 0517) Cardiac Rhythm:  [-] A-V Sequential paced (04/11 2100) Resp:  [18-20] 20 (04/12 0517) BP: (153-167)/(67-76) 157/67 mmHg (04/12 0517) SpO2:  [98 %-100 %] 98 % (04/12 0517)  Physical Exam: BP Readings from Last 1 Encounters:  04/27/13 157/67    Wt Readings from Last 1 Encounters:  04/26/13 72.802 kg (160 lb 8 oz)    Weight change:   HEENT: /AT, Eyes-Brown, Conjunctiva-Pale pink, Sclera-Non-icteric. Tongue-dry, midline. Neck: No JVD, No bruit, Trachea midline. Lungs:  Clear, Bilateral. Cardiac:  Regular rhythm, normal S1 and S2, no S3.  Abdomen:  Soft, non-tender. Extremities:  No edema present. Bilateral BKA. CNS: AxOx0, moves left upper and lower extremities.  Skin: Warm and dry.   Intake/Output from previous day: 04/11 0701 - 04/12 0700 In: 825.8 [I.V.:825.8] Out: 550 [Urine:550]    Lab Results: BMET    Component Value Date/Time   NA 140 04/20/2013 2249   NA 140 04/15/2013 0918   NA 140 04/12/2013 0340   K 4.0 04/20/2013 2249   K 4.1 04/15/2013 0918   K 4.1 04/12/2013 0340   CL 100 04/20/2013 2249   CL 106 04/15/2013 0918   CL 105 04/12/2013 0340   CO2 26 04/20/2013 2249   CO2 21 04/15/2013 0918   CO2 23 04/12/2013 0340   GLUCOSE 130* 04/20/2013 2249   GLUCOSE 155* 04/15/2013 0918   GLUCOSE 93 04/12/2013 0340   BUN 16 04/20/2013 2249   BUN 14 04/15/2013 0918   BUN 13 04/12/2013 0340   CREATININE 1.01 04/21/2013 0617   CREATININE 1.06 04/20/2013 2249   CREATININE 1.03 04/15/2013 0918   CALCIUM 9.7 04/20/2013 2249   CALCIUM 9.2 04/15/2013 0918   CALCIUM 9.3 04/12/2013 0340   GFRNONAA 68* 04/21/2013 0617   GFRNONAA 64* 04/20/2013 2249   GFRNONAA 66* 04/15/2013 0918   GFRAA 79* 04/21/2013 0617   GFRAA 74* 04/20/2013 2249   GFRAA 77* 04/15/2013 0918    CBC    Component Value Date/Time   WBC 7.5 04/21/2013 0617   RBC 3.48* 04/21/2013 0617   HGB 10.0* 04/21/2013 0617   HCT 29.9* 04/21/2013 0617   PLT 266 04/21/2013 0617   MCV 85.9 04/21/2013 0617   MCH 28.7 04/21/2013 0617   MCHC 33.4 04/21/2013 0617   RDW 16.8* 04/21/2013 0617   LYMPHSABS 0.9 04/20/2013 2249   MONOABS 0.4 04/20/2013 2249   EOSABS 0.1 04/20/2013 2249   BASOSABS 0.0 04/20/2013 2249   HEPATIC Function Panel  Recent Labs  04/03/13 0920 04/11/13 0555 04/20/13 2249  PROT 7.1 7.3 7.7   HEMOGLOBIN A1C No components found with this basename: HGA1C,  MPG   CARDIAC ENZYMES Lab Results  Component Value Date   CKTOTAL 81 09/21/2009   CKMB 1.5 09/21/2009   TROPONINI  Value: 0.22        PERSISTENTLY INCREASED TROPONIN VALUES IN THE RANGE OF 0.06-0.49 ng/mL CAN BE SEEN IN:       -UNSTABLE ANGINA       -CONGESTIVE HEART FAILURE       -MYOCARDITIS       -CHEST TRAUMA       -ARRYHTHMIAS       -LATE PRESENTING MI       -COPD  CLINICAL FOLLOW-UP RECOMMENDED.* 09/21/2009   TROPONINI  Value: 0.67        POSSIBLE MYOCARDIAL ISCHEMIA. SERIAL TESTING RECOMMENDED. CRITICAL VALUE NOTED.  VALUE IS CONSISTENT WITH PREVIOUSLY REPORTED AND CALLED VALUE.* 09/19/2009   TROPONINI  Value: 1.04        POSSIBLE MYOCARDIAL ISCHEMIA. SERIAL TESTING RECOMMENDED. CRITICAL VALUE NOTED.  VALUE IS CONSISTENT WITH PREVIOUSLY REPORTED AND CALLED VALUE.* 09/18/2009   BNP No results found for this basename: PROBNP,  in the last 8760 hours TSH No results found for this basename: TSH,  in the last 8760 hours CHOLESTEROL  Recent Labs  04/04/13 0208  CHOL 125    Scheduled Meds: . cloNIDine  0.1 mg Transdermal Weekly  . clopidogrel  37.5 mg Oral Q breakfast  . diazepam  5 mg Intravenous QHS  . nitroGLYCERIN  0.2 mg Transdermal Daily  . oxyCODONE  5 mg Sublingual 3 times per day  . scopolamine  1 patch Transdermal Q72H   Continuous Infusions: . dextrose 5 % and 0.45% NaCl 50 mL/hr at 04/26/13 1229   PRN Meds:.diazepam,  morphine injection  Assessment/Plan: Acute aspiration / pneumonia ruled out.  Acute left frontal parietal MCA /ACA territory infarct with right-sided paralysis  History of recurrent CVA in the past status post recent left internal carotid artery PTA/stenting  Coronary artery disease history of MI in the past status post PCI to RCA in the past  Hypertension  Ischemic cardiomyopathy  Compensated systolic heart failure  Diabetes mellitus controlled by diet  Hypercholesteremia  Peripheral vascular disease status post bilateral BKA in past   No artificial feeding per family. Continue comfort care.     LOS: 7 days    Orpah CobbAjay Leo Weyandt  MD  04/27/2013, 9:20 AM

## 2013-04-28 ENCOUNTER — Encounter: Payer: Medicare Other | Admitting: Internal Medicine

## 2013-04-28 DIAGNOSIS — Z515 Encounter for palliative care: Secondary | ICD-10-CM

## 2013-04-28 DIAGNOSIS — I6350 Cerebral infarction due to unspecified occlusion or stenosis of unspecified cerebral artery: Secondary | ICD-10-CM

## 2013-04-28 MED ORDER — SODIUM CHLORIDE 0.9 % IV SOLN
1.0000 mg/h | INTRAVENOUS | Status: DC
  Administered 2013-04-28 – 2013-04-30 (×2): 2 mg/h via INTRAVENOUS
  Filled 2013-04-28 (×2): qty 10

## 2013-04-28 NOTE — Progress Notes (Signed)
SLP Cancellation Note  Patient Details Name: [name] MRN: [id number] DOB: [birth year]   Cancelled treatment:        Significant decline since last seen by this SLP.  Pt. Now comfort care.  Will d/c SLP.   [name] 04/28/2013, 12:22 PM

## 2013-04-28 NOTE — Progress Notes (Signed)
Subjective:  Events of the weekend noted family decided DNR and comfort care only.  Appreciate palliative care team consult and help Objective:  Vital Signs in the last 24 hours: Temp:  [97.9 F (36.6 C)-99.1 F (37.3 C)] 97.9 F (36.6 C) (04/13 0518) Pulse Rate:  [60-61] 61 (04/13 0518) Resp:  [18-19] 18 (04/13 0518) BP: (133-160)/(63-68) 133/68 mmHg (04/13 0518) SpO2:  [100 %] 100 % (04/13 0518)  Intake/Output from previous day: 04/12 0701 - 04/13 0700 In: 1209.2 [I.V.:1209.2] Out: 635 [Urine:635] Intake/Output from this shift:    Physical Exam: Neck: no adenopathy, no carotid bruit, no JVD and supple, symmetrical, trachea midline Lungs: decreased breath sounds at bases with occasional rhonchi Heart: regular rate and rhythm, S1, S2 normal and soft systolic murmur noted no S3 gallop Abdomen: soft, non-tender; bowel sounds normal; no masses,  no organomegaly Extremities: bilateral BKA  Lab Results: No results found for this basename: WBC, HGB, PLT,  in the last 72 hours No results found for this basename: NA, K, CL, CO2, GLUCOSE, BUN, CREATININE,  in the last 72 hours No results found for this basename: TROPONINI, CK, MB,  in the last 72 hours Hepatic Function Panel No results found for this basename: PROT, ALBUMIN, AST, ALT, ALKPHOS, BILITOT, BILIDIR, IBILI,  in the last 72 hours No results found for this basename: CHOL,  in the last 72 hours No results found for this basename: PROTIME,  in the last 72 hours  Imaging: Imaging results have been reviewed and No results found.  Cardiac Studies:  Assessment/Plan:  Acute left frontal parietal MCA /ACA territory infarct with right-sided paralysis  History of recurrent CVA in the past status post recent left internal carotid artery PTA/stenting  Coronary artery disease history of MI in the past status post PCI to RCA in the past  Hypertension  Ischemic cardiomyopathy  Compensated systolic heart failure  Diabetes mellitus  controlled by diet  Hypercholesteremia  Peripheral vascular disease status post bilateral BKA in past  Plan Continue comfort care measures Awaiting [facility]   LOS: 8 days    [name] 04/28/2013, 1:54 PM

## 2013-04-28 NOTE — Progress Notes (Signed)
Palliative Medicine Team Progress Note  Patient transitioned to full comfort care and discussed in detail with the family. He is declined this AM-much less responsive. Overt aspiration even on his own secretions. He is on acheduled SL oxyfast for pain and dyspnea-given oral secretions will change this to a morphine infusion for comfort. He appears restless and disoriented. Will  Lungs with course rhonchi-he was started on D5 fluids by primary team-will discontinue these in the setting of end of life care and comfort since family has elected to not place a feeding tube and he has terminal dysphagia from his stroke. Family would like Hospice Care. [facility], will need to assess his stability for transport over next 24 hours given his acute decline - we can consider a referral for [facility] transition.  Continue comfort measures only.  Anderson MaltaElizabeth Golding, DO Palliative Medicine [phone]

## 2013-04-29 NOTE — Progress Notes (Signed)
NUTRITION FOLLOW-UP/CONSULT  INTERVENTION: Comfort care; RD signing-off  NUTRITION DIAGNOSIS: Inadequate oral intake related to inability to eat as evidenced by NPO; ongoing.   Goal: Pt to meet >/= 90% of their estimated nutrition needs; unmet  Monitor:  Weights, labs, diet advancement  ASSESSMENT: Pt with history of severe peripheral vascular disease status post bilateral below-knee amputation, hypercholesteremia, recently discharged from the hospital last month following probable recurrent CVA versus reversible ischemic neurological deficit.   Family has decided on DNR and comfort care only. Tube feedings have been discontinued. [facility]. No nutritional interventions warranted at this time, RD signing off.   Height: Ht Readings from Last 1 Encounters:  04/23/13 4\' 6"  (1.372 m)  Original ht 5\' 8"  (172.7 cm) per past medical records  Weight: Wt Readings from Last 1 Encounters:  04/26/13 160 lb 8 oz (72.802 kg)    Estimated Nutritional Needs: Kcal: [id number] Protein: 60-75g Fluid: 1.5-1.7L/day  Skin: Bilateral BKA   Diet Order: NPO   Intake/Output Summary (Last 24 hours) at 04/29/13 0954 Last data filed at 04/29/13 0500  Gross per 24 hour  Intake      0 ml  Output   1300 ml  Net  -1300 ml    Last BM: 4/13  Labs:  No results found for this basename: NA, K, CL, CO2, BUN, CREATININE, CALCIUM, MG, PHOS, GLUCOSE,  in the last 168 hours  CBG (last 3)   Recent Labs  04/26/13 1204  GLUCAP 109*    Scheduled Meds: . cloNIDine  0.1 mg Transdermal Weekly  . diazepam  5 mg Intravenous QHS  . nitroGLYCERIN  0.2 mg Transdermal Daily  . scopolamine  1 patch Transdermal Q72H    Continuous Infusions: . morphine 2 mg/hr (04/28/13 1224)   Ian Malkineanne Barnett RD, LDN Inpatient Clinical Dietitian Pager: [phone] After Hours Pager: [phone]

## 2013-04-29 NOTE — Progress Notes (Signed)
Subjective:  Patient sleeping comfortably on morphine drip.  Objective:  Vital Signs in the last 24 hours: Temp:  [97.7 F (36.5 C)-98.2 F (36.8 C)] 98 F (36.7 C) (04/14 0553) Pulse Rate:  [59-80] 80 (04/14 0553) Resp:  [18-20] 20 (04/14 0553) BP: (119-158)/(42-55) 119/48 mmHg (04/14 0553) SpO2:  [96 %-100 %] 96 % (04/14 0553)  Intake/Output from previous day: 04/13 0701 - 04/14 0700 In: 0  Out: 1300 [Urine:1300] Intake/Output from this shift:    Physical Exam: Neck: no adenopathy, no carotid bruit, no JVD and supple, symmetrical, trachea midline Lungs: Decreased breath sound at bases Heart: regular rate and rhythm, S1, S2 normal and Soft systolic murmur noted Abdomen: soft, non-tender; bowel sounds normal; no masses,  no organomegaly Extremities: Status post bilateral BKA  Lab Results: No results found for this basename: WBC, HGB, PLT,  in the last 72 hours No results found for this basename: NA, K, CL, CO2, GLUCOSE, BUN, CREATININE,  in the last 72 hours No results found for this basename: TROPONINI, CK, MB,  in the last 72 hours Hepatic Function Panel No results found for this basename: PROT, ALBUMIN, AST, ALT, ALKPHOS, BILITOT, BILIDIR, IBILI,  in the last 72 hours No results found for this basename: CHOL,  in the last 72 hours No results found for this basename: PROTIME,  in the last 72 hours  Imaging: Imaging results have been reviewed and No results found.  Cardiac Studies:  Assessment/Plan:  Acute left frontal parietal MCA /ACA territory infarct with right-sided paralysis  History of recurrent CVA in the past status post recent left internal carotid artery PTA/stenting  Coronary artery disease history of MI in the past status post PCI to RCA in the past  Hypertension  Ischemic cardiomyopathy  Compensated systolic heart failure  Diabetes mellitus controlled by diet  Hypercholesteremia  Peripheral vascular disease status post bilateral BKA in past   Plan Continue comfort care measures. Awaiting bed offers had [facility]  LOS: 9 days    Robynn PaneMohan N Shreeya Recendiz 04/29/2013, 8:58 AM

## 2013-04-29 NOTE — Consult Note (Signed)
[facility] Liaison: Continue to follow for family interest in [facility]. Unfortunately still no [facility] availability. Will update CSW if availability changes for this patient. Thank you. Breunna Nordmann LCSW [phone]      

## 2013-04-30 ENCOUNTER — Inpatient Hospital Stay (HOSPITAL_COMMUNITY)
Admission: AD | Admit: 2013-04-30 | Discharge: 2013-05-16 | DRG: 066 | Disposition: E | Source: Ambulatory Visit | Attending: Internal Medicine | Admitting: Internal Medicine

## 2013-04-30 DIAGNOSIS — E119 Type 2 diabetes mellitus without complications: Secondary | ICD-10-CM | POA: Diagnosis present

## 2013-04-30 DIAGNOSIS — I739 Peripheral vascular disease, unspecified: Secondary | ICD-10-CM | POA: Diagnosis present

## 2013-04-30 DIAGNOSIS — Z8042 Family history of malignant neoplasm of prostate: Secondary | ICD-10-CM

## 2013-04-30 DIAGNOSIS — S88119A Complete traumatic amputation at level between knee and ankle, unspecified lower leg, initial encounter: Secondary | ICD-10-CM

## 2013-04-30 DIAGNOSIS — I10 Essential (primary) hypertension: Secondary | ICD-10-CM | POA: Diagnosis present

## 2013-04-30 DIAGNOSIS — R0989 Other specified symptoms and signs involving the circulatory and respiratory systems: Secondary | ICD-10-CM

## 2013-04-30 DIAGNOSIS — R0609 Other forms of dyspnea: Secondary | ICD-10-CM

## 2013-04-30 DIAGNOSIS — H409 Unspecified glaucoma: Secondary | ICD-10-CM | POA: Diagnosis present

## 2013-04-30 DIAGNOSIS — Z66 Do not resuscitate: Secondary | ICD-10-CM | POA: Diagnosis present

## 2013-04-30 DIAGNOSIS — Z9861 Coronary angioplasty status: Secondary | ICD-10-CM

## 2013-04-30 DIAGNOSIS — Z950 Presence of cardiac pacemaker: Secondary | ICD-10-CM

## 2013-04-30 DIAGNOSIS — K1370 Unspecified lesions of oral mucosa: Secondary | ICD-10-CM

## 2013-04-30 DIAGNOSIS — F411 Generalized anxiety disorder: Secondary | ICD-10-CM | POA: Diagnosis present

## 2013-04-30 DIAGNOSIS — I6350 Cerebral infarction due to unspecified occlusion or stenosis of unspecified cerebral artery: Principal | ICD-10-CM | POA: Diagnosis present

## 2013-04-30 DIAGNOSIS — I2510 Atherosclerotic heart disease of native coronary artery without angina pectoris: Secondary | ICD-10-CM | POA: Diagnosis present

## 2013-04-30 DIAGNOSIS — Z515 Encounter for palliative care: Secondary | ICD-10-CM

## 2013-04-30 DIAGNOSIS — R29810 Facial weakness: Secondary | ICD-10-CM | POA: Diagnosis present

## 2013-04-30 DIAGNOSIS — E7800 Pure hypercholesterolemia, unspecified: Secondary | ICD-10-CM | POA: Diagnosis present

## 2013-04-30 DIAGNOSIS — Z8249 Family history of ischemic heart disease and other diseases of the circulatory system: Secondary | ICD-10-CM

## 2013-04-30 DIAGNOSIS — I639 Cerebral infarction, unspecified: Secondary | ICD-10-CM | POA: Diagnosis present

## 2013-04-30 DIAGNOSIS — F0390 Unspecified dementia without behavioral disturbance: Secondary | ICD-10-CM | POA: Diagnosis present

## 2013-04-30 DIAGNOSIS — R4701 Aphasia: Secondary | ICD-10-CM | POA: Diagnosis present

## 2013-04-30 MED ORDER — SCOPOLAMINE 1 MG/3DAYS TD PT72: 1.0000 | MEDICATED_PATCH | TRANSDERMAL | Status: DC

## 2013-04-30 MED ORDER — MORPHINE BOLUS VIA INFUSION
1.0000 mg | INTRAVENOUS | Status: DC | PRN
  Filled 2013-04-30: qty 1

## 2013-04-30 MED ORDER — DIAZEPAM 5 MG/ML IJ SOLN
2.5000 mg | Freq: Four times a day (QID) | INTRAMUSCULAR | Status: DC
  Administered 2013-05-01 (×2): 2.5 mg via INTRAVENOUS
  Filled 2013-04-30: qty 2

## 2013-04-30 MED ORDER — ATROPINE SULFATE 1 % OP SOLN
4.0000 [drp] | Freq: Four times a day (QID) | OPHTHALMIC | Status: DC
  Administered 2013-04-30 (×2): 4 [drp] via SUBLINGUAL
  Filled 2013-04-30: qty 2

## 2013-04-30 MED ORDER — DIAZEPAM 5 MG/ML IJ SOLN
2.5000 mg | Freq: Four times a day (QID) | INTRAMUSCULAR | Status: DC
  Administered 2013-04-30: 2.5 mg via INTRAVENOUS

## 2013-04-30 MED ORDER — ATROPINE SULFATE 1 % OP SOLN
4.0000 [drp] | Freq: Four times a day (QID) | OPHTHALMIC | Status: DC
  Administered 2013-04-30: 4 [drp] via SUBLINGUAL
  Filled 2013-04-30: qty 2

## 2013-04-30 MED ORDER — SODIUM CHLORIDE 0.9 % IV SOLN
1.0000 mg/h | INTRAVENOUS | Status: DC
  Administered 2013-05-01: 5 mg/h via INTRAVENOUS
  Filled 2013-04-30 (×2): qty 10

## 2013-04-30 MED ORDER — NITROGLYCERIN 0.2 MG/HR TD PT24
0.2000 mg | MEDICATED_PATCH | Freq: Every day | TRANSDERMAL | Status: DC
  Administered 2013-05-01: 0.2 mg via TRANSDERMAL
  Filled 2013-04-30: qty 1

## 2013-04-30 MED ORDER — ACETAMINOPHEN 650 MG RE SUPP: 650.0000 mg | Freq: Four times a day (QID) | RECTAL | Status: DC | PRN

## 2013-04-30 MED ORDER — CLONIDINE HCL 0.1 MG/24HR TD PTWK: 0.1000 mg | MEDICATED_PATCH | TRANSDERMAL | Status: DC

## 2013-04-30 MED ORDER — DIAZEPAM 5 MG/ML IJ SOLN
2.5000 mg | Freq: Four times a day (QID) | INTRAMUSCULAR | Status: DC
  Administered 2013-04-30: 2.5 mg via INTRAVENOUS
  Filled 2013-04-30 (×3): qty 2

## 2013-04-30 MED ORDER — ACETAMINOPHEN 325 MG PO TABS
650.0000 mg | ORAL_TABLET | Freq: Four times a day (QID) | ORAL | Status: DC | PRN
Start: 2013-04-30 — End: 2013-05-02

## 2013-04-30 MED ORDER — ATROPINE SULFATE 1 % OP SOLN
4.0000 [drp] | Freq: Four times a day (QID) | OPHTHALMIC | Status: DC
  Administered 2013-04-30 – 2013-05-01 (×3): 4 [drp] via SUBLINGUAL
  Filled 2013-04-30: qty 2

## 2013-04-30 NOTE — H&P (Signed)
History and Physical:    [name] NWG:[id number] DOB: 11-Dec-1931 DOA: 04/23/2013  Referring physician: Dr. Sharyn LullHarwani PCP: Robynn PaneHARWANI,MOHAN N, MD   Chief Complaint: Altered mental status related to CVA  History of Present Illness:   [name] ParisBilly [name] is an 80 y.o. male  African american male presented on 04/21/2013 from his SNF with altered mental status and rght facial droop with aphasia.  Patient was not thrombolytics candidate.  CT confirmed event in left frontoparietal MCA.  Patient was treated conservatively but continued to decline and comfort care was elected.  He was transitioned to GIP status on  4/15.   ROS:   Constitutional: unable due to altered mental status.   Past Medical History:   Past Medical History  Diagnosis Date  . Hypercholesteremia     takes Lipitor daily  . CAD (coronary artery disease)     s/p PTCA to PLV branch in 2002  . Non-insulin dependent diabetes mellitus   . PVD (peripheral vascular disease)     s/p right BKA  . CVA (cerebral infarction)     s/p PTA to middle cerebral artery;slurred speech  . HTN (hypertension)     takes Benazepril daily  . Pacemaker   . Shortness of breath     sitting as well as standing  . Glaucoma   . Complete heart block     s/p Medtronic PPM by JA 09/2009  . Stroke     Past Surgical History:   Past Surgical History  Procedure Laterality Date  . Pacemaker insertion  09/2010  . Appendectomy    . Amputation      below amputation  . Cardiac catheterization      2002/2009  . Amputation  08/02/2011    Procedure: AMPUTATION BELOW KNEE;  Surgeon: Larina Earthlyodd F Early, MD;  [facility];  Service: Vascular;  Laterality: Left;  . Insert / replace / remove pacemaker    . Radiology with anesthesia N/A 04/11/2013    Procedure: RADIOLOGY WITH ANESTHESIA;  Surgeon: Oneal GroutSanjeev K Deveshwar, MD;  Location: [facility] OR;  Service: Radiology;  Laterality: N/A;  LEFT CAROTID STENT    Social History:   History   Social History  . Marital  Status: Married    Spouse Name: N/A    Number of Children: N/A  . Years of Education: N/A   Occupational History  . Not on file.   Social History Main Topics  . Smoking status: Never Smoker   . Smokeless tobacco: Never Used  . Alcohol Use: No  . Drug Use: No  . Sexual Activity: Not Currently   Other Topics Concern  . Not on file   Social History Narrative  . No narrative on file    Family history:   Family History  Problem Relation Age of Onset  . Heart failure Mother   . Heart failure Father   . Prostate cancer Brother     Allergies   Review of patient's allergies indicates no known allergies.  Current Medications:   Prior to Admission medications   Not on File    Physical Exam:    Temp : 98.6  P: 59 R: 22 BP 104/46  Oxygenation: 88  Physical Exam:  General: Mild distress with vocalizations,not following commands  Pupils not examined, mmm, oral secretions noted  Chest : decreased with some basilar rhonchi,no wheezing  CVS: regular, but distant,no murmur noted  Abd: soft, not distended no grimace  Ext: bilateral amputee, stumps healed  Neuro: Unconscious, not  following commands      Data Review:    Labs: Reviewed from initial visit.  Not pertinent CBG:  Recent Labs Lab 04/25/13 2001 04/26/13 0010 04/26/13 0348 04/26/13 0802 04/26/13 1204  GLUCAP 147* 126* 127* 107* 109*    Radiographic Studies:  1. Evolving infarct involving portions of the left MCA and ACA  territories, better characterized on the current study, with  associated mass-effect on the left lateral ventricle but no evidence  of midline shift. No evidence of hemorrhagic transformation at this  time. Diffuse involvement of the left basal ganglia and partial  involvement of the left thalamus.  2. Cerebellar atrophy noted; scattered small chronic lacunar  infarcts within the cerebellar hemispheres bilaterally. Scattered  small vessel ischemic  microangiopathy   Assessment/Plan:   Active Problems:   CVA (cerebral infarction) Dementia    1.  Full comfort care 2.  Terminal secretions: added atropine to scopalomine 3.  Respiratory distress: On morphine 5 mg, titrate for comfort 4.  Agitation /anxiety: schedule low dose valium q 6 hours   Code Status: DNR Family Communication: Family to be available in am around 12,  Will visit in person at that time. Disposition Plan:  Hospital death anticipated.  Time spent: 1145-1208  Shoua Ressler L. Ladona Ridgelaylor, MD MBA The Palliative Medicine Team at [facility] Team Phone: [phone] Pager: [phone]

## 2013-04-30 NOTE — Discharge Summary (Signed)
  Brief discharge summary dictated on 05/02/2013 dictation number is [id number]

## 2013-04-30 NOTE — Progress Notes (Signed)
Subjective:  Patient sleeping comfortably on morphine drip increased secretions noted  Objective:  Vital Signs in the last 24 hours: Temp:  [97.7 F (36.5 C)-99.4 F (37.4 C)] 99.4 F (37.4 C) (04/15 0612) Pulse Rate:  [78-91] 91 (04/15 0612) Resp:  [18-20] 18 (04/15 0612) BP: (117-158)/(56-66) 117/66 mmHg (04/15 0612) SpO2:  [98 %-100 %] 98 % (04/15 0612)  Intake/Output from previous day: 04/14 0701 - 04/15 0700 In: -  Out: 350 [Urine:350] Intake/Output from this shift:    Physical Exam: Neck: no adenopathy, no carotid bruit, no JVD and supple, symmetrical, trachea midline Lungs: Bilateral rhonchi noted Heart: regular rate and rhythm and S1, S2 normal Abdomen: soft, non-tender; bowel sounds normal; no masses,  no organomegaly Extremities:  status post bilateral BKA  Lab Results: No results found for this basename: WBC, HGB, PLT,  in the last 72 hours No results found for this basename: NA, K, CL, CO2, GLUCOSE, BUN, CREATININE,  in the last 72 hours No results found for this basename: TROPONINI, CK, MB,  in the last 72 hours Hepatic Function Panel No results found for this basename: PROT, ALBUMIN, AST, ALT, ALKPHOS, BILITOT, BILIDIR, IBILI,  in the last 72 hours No results found for this basename: CHOL,  in the last 72 hours No results found for this basename: PROTIME,  in the last 72 hours  Imaging: Imaging results have been reviewed and No results found.  Cardiac Studies:  Assessment/Plan:  Acute left frontal parietal MCA /ACA territory infarct with right-sided paralysis  History of recurrent CVA in the past status post recent left internal carotid artery PTA/stenting  Coronary artery disease history of MI in the past status post PCI to RCA in the past  Hypertension  Ischemic cardiomyopathy  Compensated systolic heart failure  Diabetes mellitus controlled by diet  Hypercholesteremia  Peripheral vascular disease status post bilateral BKA in past  Plan Continue  comfort care Okay to transfer to inpatient hospice care   LOS: 10 days    Robynn PaneMohan N Eri Platten 2013/08/21, 11:12 AM

## 2013-04-30 NOTE — H&P (Signed)
  Patient WU:[id number]:[name]      DOB: [birth year]      JYN:[id number]  Patient accepted for Hospice Care (GIP)  ,  Diagnosis CVA.  Full H and P to follow see my note from today.  Anagabriela Jokerst L. Ladona Ridgelaylor, MD MBA The Palliative Medicine Team at [facility] Team Phone: [phone] Pager: [phone]

## 2013-04-30 NOTE — Progress Notes (Signed)
Patient YQ:[id number]:[name]      DOB: 11-Jan-1932      ONG:[id number]   Palliative Medicine Team at [facility] Progress Note    Subjective: No family in room.  Patient with eyes closed. Will intermittently moan/grunt usually related to clearing secretions.  Increased oral secretions noted. Atropine has been ordered in addition to scopolamine patch.  Periods of apnea noted.  No respiratory distress noted, but secretions appear to be disturbing him as this is the only time he vocalizes.  Appears very near death.    Filed Vitals:   05/08/2013 0612  BP: 117/66  Pulse: 91  Temp: 99.4 F (37.4 C)  Resp: 18   Physical exam:  General:  Mild distress with vocalizations,not following commands Pupils not examined, mmm, oral secretions noted Chest : decreased with some basilar rhonchi,no wheezing CVS: regular, but distant,no murmur noted Abd: soft, not distended no grimace Ext: bilateral amputee, stumps healed Neuro:  Unconscious, not following commands   Assessment and plan: 80 yr old african [place] male s/p large MCA stroke in the face of multiple strokes, ICM, EF 25 %.  Family has elected full comfort care.  Currently, needs medications titrated for distress.  1.  DNR 2.  Dyspnea: Controlled on current morphine drip.   3.  Terminal secretions: atropine added this am , has scop patch in place 4. Encephalopathy metabolic, slight agitated likely due to secretions.  Will schedule low dose valium and treat secretions 5.  Apnea:  Likely near death but difficult to prognosticate in these case.  Time: 1145- 12 08.  Reviewed with Hospice team.  GIP requested.  He is likely near his time of death.   Gunda Maqueda L. Ladona Ridgelaylor, MD MBA The Palliative Medicine Team at [facility] Team Phone: [phone] Pager: [phone]

## 2013-04-30 NOTE — Progress Notes (Signed)
Pt suctioned x 3, NP notified and new orders received for Atropine; New order received for foley and foley inserted at 1235. Pt shaved as requested by family. Pt remains in bed comfortably and will continue to monitor quietly. Dionne BucyP. Amo Helaine Yackel RN

## 2013-04-30 NOTE — Progress Notes (Signed)
No beds available for Inpt hospice at this time; Talked to Attending MD about GIP (General Inpt Hospice) and he is agreeable and also talked to the patient's spouse Christella Scheuermannda [phone]( [phone]) to offer choice, Ada [facility]; message left with Forrestine HimEva Davis of Hospice for the consultation; Alexis GoodellB Laresha Bacorn RN,BSN,MHA [phone]

## 2013-04-30 NOTE — Progress Notes (Signed)
Inpatient [facility] Rm 4N17 [name]  [facility] RN Visit- M. Konrad DoloresLester, RN (Late entry pt seen at bedside approx. 1:00 pm) Pt s/p large MCA stroke in the face of multiple strokes, ICM, EF 25 %.  Family has elected full comfort care;  Attending and Palliative Medicine physicians feel pt too unstable to transport and requested evaluation for GIP level of care. Pt information reviewed with [name] and confirmed related admission to [facility] GIP level of care; Dx: CVA (434.91)   -Pt is a DNR code status; continues to require titration of medication for agitation and respiratory distress;  On this visit, pt unresponsive to voice or gentle touch; facial droop with tongue protruding and jaw slack; s/sx aspiration noted as pt unable to handle own secretions; requiring oral suctioning; observed with grunting, labored respirations, with intermittent 5-10 sec periods of apnea - currently on IV Morphine at 5 mg/hr - (per chart notes, dose increased this morning for s/sx distress); Atropine SL prn and scheduled IV Valium initiated today for agitation which appears related to increased secretions/congestion. Effectiveness of symptom management is continuously re-evaluated to achieve optimum comfort pt NPO x 3 days; incontinent bowel and Bladder; Foley catheter placed today for EOL comfort PPS =10% and a hospital death is anticipated. Writer spoke with son [name] after seeing patient; he was agreeable to meeting with [facility] SW to review services and complete paperwork. He shared the family's goal is comfort for their dad and is appreciative of the care pt is receiving. [name] is aware [facility] team continues to follow daily and collaborates with attending Dr Derenda MisMelissa Taylor in his care.   Please call [facility] @ [phone]  with any hospice needs.   Thank you.  Valente DavidMargie Deaunna Olarte, RN  [facility]  Hospice Liaison  [phone])

## 2013-04-30 NOTE — Consult Note (Signed)
[facility] Liaison: Continue to follow for [facility]. [facility]. Will update CSW if availability changes for this patient. Thank you. Forrestine Himva Arthea Nobel LCSW [phone]

## 2013-05-01 ENCOUNTER — Encounter (HOSPITAL_COMMUNITY): Payer: Self-pay | Admitting: General Practice

## 2013-05-01 DIAGNOSIS — I6350 Cerebral infarction due to unspecified occlusion or stenosis of unspecified cerebral artery: Principal | ICD-10-CM

## 2013-05-16 NOTE — Discharge Summary (Signed)
Death Summary  [name] VEX:[id number] DOB: 1931-04-16 DOA: 05-13-13  PCP: Clent Demark, MD PCP/Office notified: Dr. Lovena Le will call .  Admit date: 2013/05/13 Date of Death: 05-15-2013  Final Diagnoses:  Active Problems:   CVA (cerebral infarction) Dementia  History of present illness: 80 yr old african american male admitted to the hospital on 4/6 with aphasia, right sided facial droop, and altered mental status. CT confirmed left frontoparietal MCA stroke.  Family elected full comfort care and the patient was transitioned to hospice inpatient status on 14-May-2022.    Hospital Course:  Patient was seen and examined this am.  He remained without signs of distress, I met his sister and niece this afternoon to offer emotional support.  Patient required continuous opiates and scheduled valium to maintain comfort.  This afternoon he was found without respiration or pulse and was declared deceased at 458 pm.     Time: 458 pm  Signed: Keyli Duross L. Lovena Le, MD MBA The Palliative Medicine Team at [facility] Phone: [phone] Pager: [phone]

## 2013-05-16 NOTE — Discharge Summary (Signed)
NAMLouretta [name]:  [name], [name]                ACCOUNT NO.:  [id number]  MEDICAL RECORD NO.:  [id number]  LOCATION:  [id number]                        FACILITY:  [facility]  PHYSICIAN:  [name], M.D. DATE OF BIRTH:  June 19, 1931  DATE OF ADMISSION:  04/20/2013 DATE OF DISCHARGE:  March 10, 2013                              DISCHARGE SUMMARY   The patient will be transferred to inpatient palliative care services. The patient will be discharged to inpatient palliative care service.  ADMITTING DIAGNOSES: 1. Acute left frontoparietal MCA territory infarct probably due to     occlusion of the right __________. 2. History of recurrent cerebrovascular accident in the past. 3. Coronary artery disease, history of myocardial infarction in the     past status post PCI to RCA in the past. 4. Hypertension. 5. Ischemic cardiomyopathy. 6. Compensated systolic heart failure. 7. Diabetes mellitus controlled by diet. 8. Hypercholesteremia. 9. Peripheral vascular disease status post bilateral below-knee     amputation in the past.  DISCHARGE DIAGNOSES: 1. Status post acute left frontoparietal MCA/ACA territory infarct     with right-sided paralysis. 2. History of recurrent cerebrovascular accident in the past status     post recent left internal carotid artery PTA/stenting, which is     patent. 3. Coronary artery disease, history of myocardial infarction in the     past status post PCI to RCA in the past. 4. Hypertension. 5. Ischemic cardiomyopathy. 6. Compensated systolic heart failure. 7. Diabetes mellitus controlled by diet. 8. Hypercholesteremia. 9. Peripheral vascular disease status post bilateral below-knee     amputation in the past.  The patient is comfort care only.  Family has decided comfort measures only with any interventions.  MEDICATIONS ON DISCHARGE: 1. The patient will be continued on atropine 1% ophthalmic solution 4     drops sublingual 4 times daily as needed. 2. Catapres patch 0.1 mg  q.7 days. 3. Valium 2.5 mg IV every 6 hours p.r.n. 4. Morphine sulfate drip 2 mg every 2 hours. 5. Nitro-Dur patch 0.2 mg every day. 6. Scopolamine patch 1.5 mg every 72 hours.  CONDITION AT DISCHARGE:  Critical.  BRIEF HISTORY AND HOSPITAL COURSE:  [name] is an 80 year old male with past medical history significant for multiple medical problems, i.e., coronary artery disease, history of myocardial infarction in the past, ischemic cardiomyopathy, history of complete heart block status post permanent pacemaker in the past, history of CVA in the past, had PTCA stenting to mid left internal carotid artery, hypertension, non- insulin-dependent diabetes mellitus controlled by diet, severe peripheral vascular disease status post bilateral BKA in the past, hypercholesteremia, recently discharged from the hospital, following PTA stenting to the left internal carotid artery __________ skilled nursing facility [facility] as the patient was noted to have increasing confusion, right facial droop, and aphasia noted by the family around 8 p.m. last night.  As per family, the patient did not eat lunch yesterday as the patient was more confused but was able to take his medication.  Family noted patient to be aphasic with right- sided facial droop and weakness in the right upper extremity and [facility].  The patient was felt not to be a candidate for emergency percutaneous intervention or thrombolytic therapy due to delay in arriving to the hospital.  CT of the brain showed new subtle low-attenuation left frontoparietal __________.  PHYSICAL EXAMINATION:  VITAL SIGNS:  His blood pressure was 128/85, pulse __________ was afebrile. HEENT:  Conjunctiva was pink.  Pupils are equal, round, and reacting to light. NECK:  Supple.  No JVD.  No bruit. HEART:  Regular rate and rhythm.  There was soft systolic murmur.  No S3,  gallop. LUNGS:  Clear to auscultation bilaterally. ABDOMEN:  Soft, nontender, obese. EXTREMITIES:  He had bilateral below-knee amputation. NEUROLOGIC:  He is awake __________.  Does not follow any commands. Right upper and lower extremities __________ .  Most recently, although fire in the left upper and lower extremity 5/5.  Sensory __________ facial droop.  LABS:  His hemoglobin was 10, hematocrit 29.9, white count of 7.5. Sodium 140, potassium 4.0, BUN 16, creatinine 1.06.  Blood sugar level 130.  The patient had CT of the brain which showed subtle attenuation with __________ local mass effect over the left MCA territory of the frontoparietal regions suggesting acute infarction.  No evidence of acute hemorrhage, chronic ischemic microvascular disease was also noted. Repeat CT showed evolving infarct __________ portion of the left MCA and ACA territories __________ better characterized on the current study with associated mass effect on the left lateral ventricle but no evidence of midline shift.  No evidence of hemorrhagic transformation at this time.  Diffuse involvement of left basal ganglion and partial involvement of the left __________ cerebellar atrophy also was noted __________ chronic lacunar infarcts in the cerebellar hemispheres bilaterally.  Scattered small-vessel ischemic microangiopathy was also noted.  BRIEF HOSPITAL COURSE:  The patient was admitted to telemetry unit initially and subsequently was transferred to ICU as patient became more lethargic and unresponsive.  The patient's family decided DNR and comfort care measures.  Palliative care team consultation was obtained. The patient's family also decided no feeding and just comfort measures and wanted the patient to be transferred to __________. Unfortunately, no rooms are available at __________.  At this point, the patient will be transferred to inpatient palliative care, and will be transferred to __________  service.     [name], M.D.     MNH/MEDQ  D:  04/18/2013  T:  May 12, 2013  Job:  [id number]

## 2013-05-16 NOTE — Progress Notes (Signed)
Pt family continue to remain at side; still awaiting on family's decision on disposition of body; reported off to incoming RN and pt death certificate received from Dr. Ladona Ridgelaylor and handed off to incoming RN Brett CanalesSteve. Pt morphine drip had 16.91ml remaining and waited with incoming RN. Arabella MerlesP. Amo Brindley Madarang RN.

## 2013-05-16 NOTE — Progress Notes (Signed)
Inpatient [facility] 4N room 17  [facility] RN visit Dayna BarkerKaren Robertson RN  Related GIP admission of [facility] dx: CVA Pt is DNR code   Patient lying in bed, unresponsive to voice or touch, shallow respirations with periods of apnea. Pt remains on a continuous morphine gtt at 5mg /hr, appears comfortable.  Pt continues to require skilled nursing assessment and symptom management. Sister and granddaughter at bedside and shared their awareness of patient changes. Discussed expected s/s of EOL, support offered, expressed appreciation for care being provided.  [facility] team follows daily collaborating with attending Dr.Taylor. Call [facility] [phone] with any Hospice needs.  Please notify PMT attending at time of death call [phone]  Dayna BarkerKaren Robertson RN, BSN, [facility] Liaison

## 2013-05-16 NOTE — Progress Notes (Signed)
Nutrition Brief Note  Patient identified due to Low Braden score   Chart reviewed. Pt is unresponsive and is NPO, DNR, and comfort care only.  No nutrition interventions warranted at this time. If nutrition issues arise, please consult RD.   Ian Malkineanne Barnett RD, LDN Inpatient Clinical Dietitian Pager: [phone] After Hours Pager: [phone]

## 2013-05-16 NOTE — Progress Notes (Signed)
Pt was found at 1658 by RN not breathing, pulseless, no heart beat when auscultated and BP unreadable when checked. Second RN French Anaracy was called to the room to verify. Pt pronounced dead by 2 RN after verification; pt IV removed, Oxygen removed, PMT office called x2 attempt; Dr. Ladona Ridgelaylor paged to be notified; pt family called and son [name] notified. Family to call RN back with family decision what they want. RN awaiting call back from family and PMT MD. [facility] called and pt not suitable for organs, tissues or eyes. Arabella MerlesP. Amo Jannat Rosemeyer RN.

## 2013-05-16 NOTE — Progress Notes (Signed)
Dr. Ladona Ridgelaylor returns call back; pt foley discontinued, Nitro and scopolamine patches removed from pt; pt cleaned up and family at bedside. Awaiting on families decision on disposition. Arabella MerlesP. Amo Dacota Devall RN.

## 2013-05-16 DEATH — deceased

## 2013-12-25 ENCOUNTER — Encounter (HOSPITAL_COMMUNITY): Payer: Self-pay | Admitting: Surgery

## 2014-09-30 IMAGING — CT CT ANGIO NECK
2 of 8 series · 8 of 33 positions shown · IV contrast (omnipaque)
Comparison: Prior CT performed earlier on the same day as well as
prior angiogram from 04/11/2013.

CLINICAL DATA: Left MCA territory infarct. History of recent stroke
and carotid stenting. Evaluate stent and arterial vasculature of the
neck.

EXAM:
CT ANGIOGRAPHY HEAD AND NECK
TECHNIQUE: Multidetector CT imaging of the head and neck was performed using
the standard protocol during bolus administration of intravenous
contrast. Multiplanar CT image reconstructions and MIPs were
obtained to evaluate the vascular anatomy. Carotid stenosis
measurements (when applicable) are obtained utilizing NASCET
criteria, using the distal internal carotid diameter as the
denominator.
CONTRAST:  100mL OMNIPAQUE IOHEXOL 350 MG/ML SOLN

[Series 6: headangio 2.0 h20f · axial · 0.55mm/px · z∈[-224,-110]mm · 2 of 172 slices shown]
[im 58/172  soft-tissue]
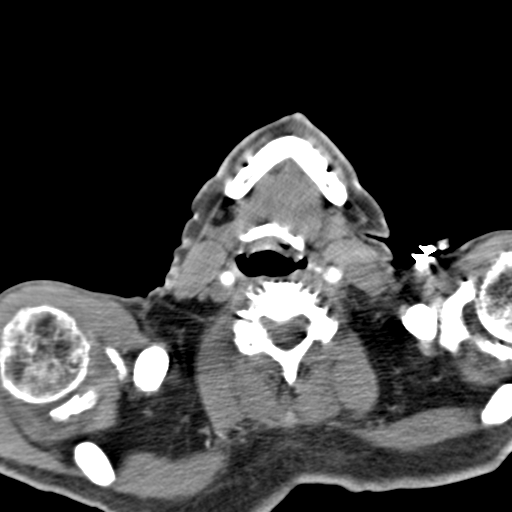
[im 115/172  bone]
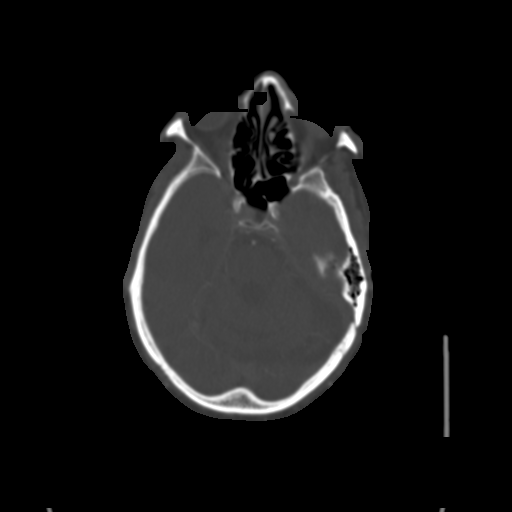

[Series 9: axial 1x1 · axial · 0.53mm/px · z∈[-308,-69]mm · 6 of 337 slices shown]
[im 49/337  soft-tissue]
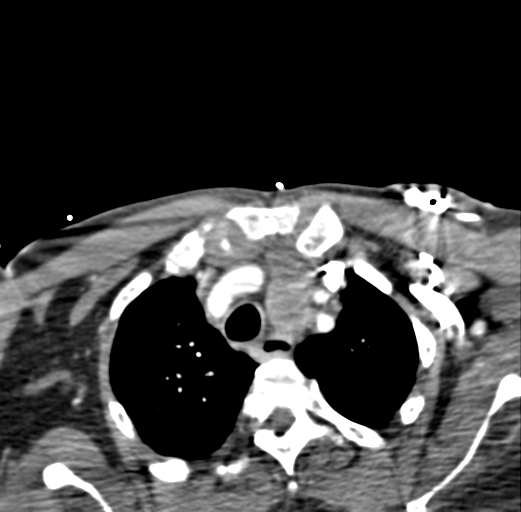
[im 97/337  soft-tissue]
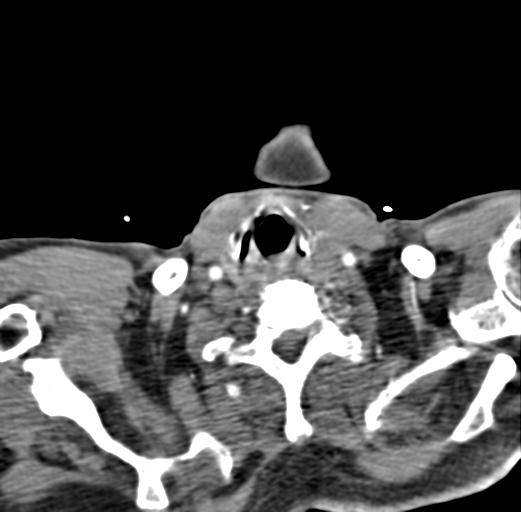
[im 145/337  soft-tissue]
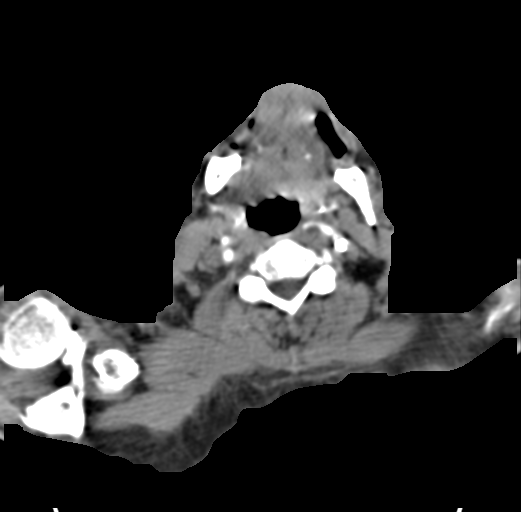
[im 193/337  soft-tissue]
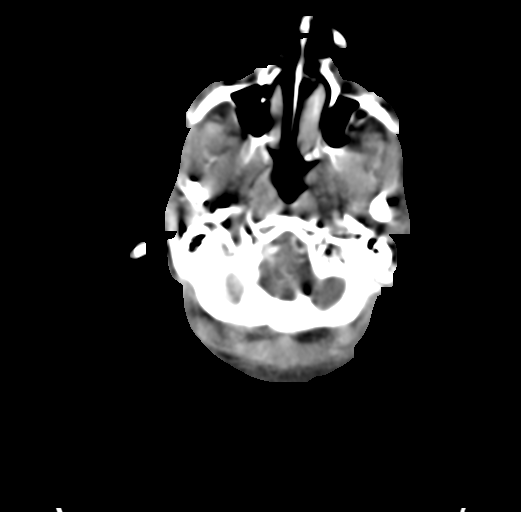
[im 241/337  soft-tissue]
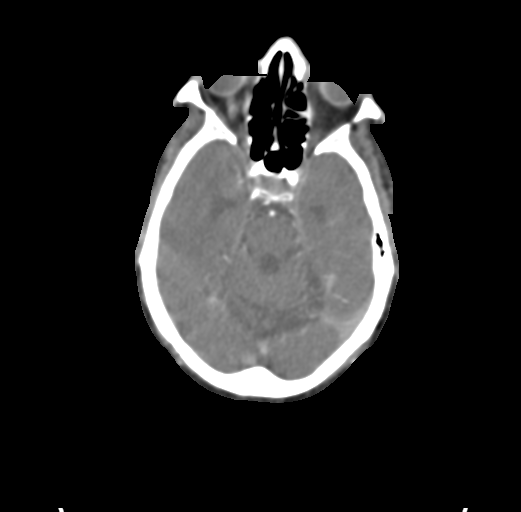
[im 289/337  soft-tissue]
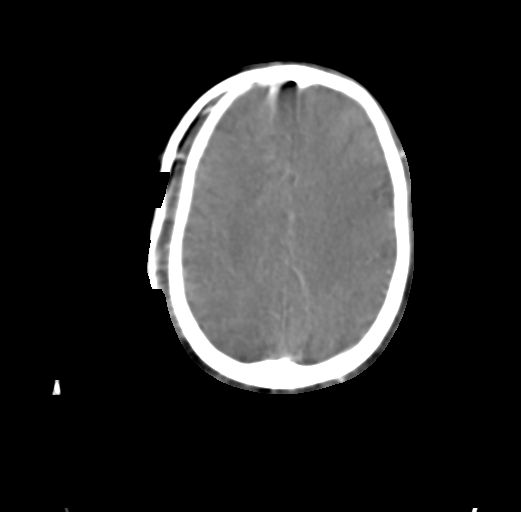

[8 of 33 positions shown; findings below may reference images not displayed]

FINDINGS: CTA HEAD FINDINGS

The study markedly limited by motion artifact.

The distal cervical, petrous, cavernous, and supra clinoid segments
of the internal carotid arteries are grossly patent without
high-grade flow-limiting stenosis or occlusion. A1 segments are
opacified bilaterally. Anterior communicating artery is opacified.
Anterior cerebral arteries are grossly root normal.

There is question of focal occlusion of the mid left M1 segment
(series 13, image 22). Attenuated flow was seen within the left MCA
branches distally, which may be via leptomeningeal
collateralization.

View right MCA artery and its branches are grossly patent.

The distal vertebral arteries are grossly patent. The
vertebrobasilar junction and basilar artery are opacified. Posterior
cerebral arteries are opacified and symmetric bilaterally. Superior
cerebellar arteries are opacified. The anterior inferior and
posterior inferior cerebral arteries are not well evaluated on this
exam.

Focal hypoattenuation with loss of gray-white matter differentiation
seen within the posterior left frontal lobe, concerning for acute
ischemic infarct (series 7, image 20). , concerning for acute
ischemic infarct. No acute intracranial hemorrhage. No mass lesion
or midline shift. No hydrocephalus. No extra-axial fluid collection.

CTA NECK FINDINGS

Examination is markedly limited by a motion artifact.

Visualized aortic arch is of normal caliber with normal 3 vessel
morphology. No high-grade stenosis seen at the origin of the great
vessels. Subclavian arteries are patent bilaterally.

Without common carotid arteries are grossly patent without evidence
of high-grade flow-limiting stenosis or dissection. Multi focal
irregularity seen about the carotid bifurcations likely related to
underlying atheromatous disease. Endovascular stent present within
the left internal carotid artery extending from its origin at the
level of the carotid bifurcation to approximately the level of C2.
Flow within the stent itself is not well visualized. Just distal to
the stent, flow appears somewhat attenuated as compared to the
contralateral right internal carotid artery (series 6, image 82).
Evaluation is limited due to motion. Distally, the left internal
carotid artery is patent without high-grade flow-limiting stenosis.

The the right internal carotid artery is grossly patent with
antegrade flow without evidence of high-grade flow-limiting stenosis
or dissection.

Vertebral arteries are codominant. Vertebral arteries are grossly
patent without evidence of high-grade flow-limiting stenosis,
occlusion, or dissection.

Enlarged multinodular goiter noted with substernal extension of the
left thyroid lobe. Soft tissues of the neck are otherwise within
normal limits without evidence of mass lesion, loculated fluid
collection, or focal inflammatory changes. No acute osseous
abnormality. Visualized lung apices are clear.
IMPRESSION: CTA HEAD:

1. Markedly limited study due to motion. There is question of abrupt
occlusion of the mid left M1 segment. Flow within the distal left
MCA branches is somewhat attenuated, and may be via leptomeningeal
collateralization.
2. Acute ischemic left MCA territory infarct involving the left
frontoparietal region, stable relative to prior CT performed
earlier.
3. No other proximal branch occlusion, or definite high-grade
flow-limiting stenosis identified within the intracranial
circulation.

CTA NECK:

1. Markedly limited study due to motion. There is flow within the
left internal carotid artery distal to the carotid stent, with flow
within the stent itself not well evaluated on this examination due
to extensive motion. Further evaluation with carotid Doppler
ultrasound and/or catheter directed angiogram to evaluate stent
patency recommended.
2. No other high-grade flow-limiting stenosis, occlusion, or
dissection identified within the neck.
3. Enlarged multinodular goiter.
Critical Value/emergent results were called by telephone at the time
of interpretation on 04/21/2013 at [DATE] to Dr. NOMASIBULELE MOATSHE
, who verbally acknowledged these results.

## 2014-10-04 IMAGING — CR DG CHEST 1V PORT SAME DAY
1 series · 1 of 1 positions shown · non-contrast
Comparison: 04/21/2013

CLINICAL DATA: Aspiration.

EXAM:
PORTABLE CHEST - 1 VIEW SAME DAY

[AP]
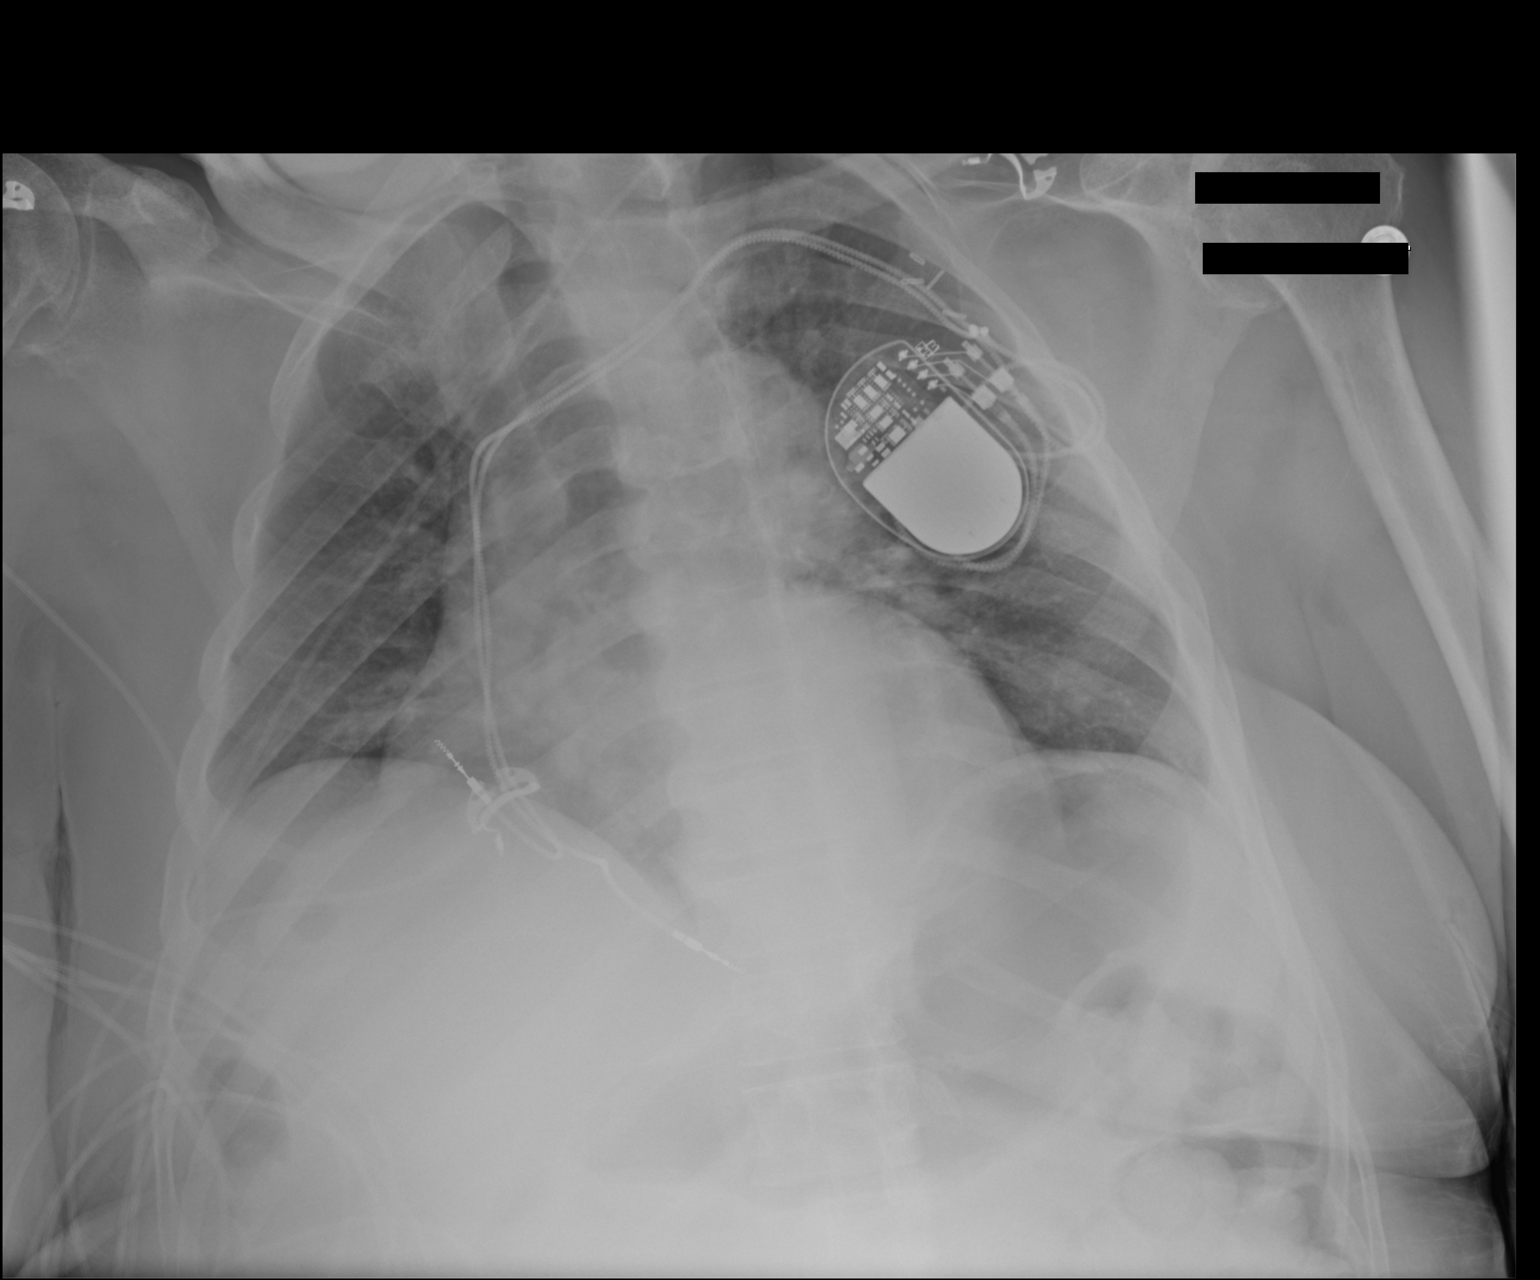

[1 of 1 positions shown; findings below may reference images not displayed]

FINDINGS: Mild right basilar opacity is noted most consistent with
atelectasis. Lungs are otherwise clear allowing for rotation and
relatively low lung volumes.

Cardiac silhouette is mildly enlarged.

No pleural effusion. No pneumothorax. Stable left anterior chest
wall pacemaker.
IMPRESSION: No acute cardiopulmonary disease.
